# Patient Record
Sex: Female | Born: 1985 | Race: White | Hispanic: No | Marital: Married | Smoking: Former smoker
Health system: Southern US, Community
[De-identification: ages and names within clinical notes are randomized; demographics above are authoritative.]

## PROBLEM LIST (undated history)

## (undated) ENCOUNTER — Inpatient Hospital Stay (HOSPITAL_COMMUNITY): Payer: Self-pay

## (undated) ENCOUNTER — Emergency Department (HOSPITAL_BASED_OUTPATIENT_CLINIC_OR_DEPARTMENT_OTHER): Admission: EM | Payer: 59

## (undated) DIAGNOSIS — Z87442 Personal history of urinary calculi: Secondary | ICD-10-CM

## (undated) DIAGNOSIS — N12 Tubulo-interstitial nephritis, not specified as acute or chronic: Secondary | ICD-10-CM

## (undated) DIAGNOSIS — T4145XA Adverse effect of unspecified anesthetic, initial encounter: Secondary | ICD-10-CM

## (undated) DIAGNOSIS — F419 Anxiety disorder, unspecified: Secondary | ICD-10-CM

## (undated) DIAGNOSIS — N83209 Unspecified ovarian cyst, unspecified side: Secondary | ICD-10-CM

## (undated) DIAGNOSIS — O139 Gestational [pregnancy-induced] hypertension without significant proteinuria, unspecified trimester: Secondary | ICD-10-CM

## (undated) DIAGNOSIS — T8859XA Other complications of anesthesia, initial encounter: Secondary | ICD-10-CM

## (undated) DIAGNOSIS — K56609 Unspecified intestinal obstruction, unspecified as to partial versus complete obstruction: Secondary | ICD-10-CM

## (undated) DIAGNOSIS — M1990 Unspecified osteoarthritis, unspecified site: Secondary | ICD-10-CM

## (undated) DIAGNOSIS — L4050 Arthropathic psoriasis, unspecified: Secondary | ICD-10-CM

## (undated) DIAGNOSIS — F32A Depression, unspecified: Secondary | ICD-10-CM

## (undated) DIAGNOSIS — K567 Ileus, unspecified: Secondary | ICD-10-CM

## (undated) DIAGNOSIS — F329 Major depressive disorder, single episode, unspecified: Secondary | ICD-10-CM

## (undated) HISTORY — PX: WISDOM TOOTH EXTRACTION: SHX21

## (undated) HISTORY — PX: ABDOMINAL HYSTERECTOMY: SHX81

## (undated) SURGERY — Surgical Case
Anesthesia: *Unknown

---
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
## 2006-09-23 ENCOUNTER — Emergency Department (HOSPITAL_COMMUNITY): Admission: EM | Admit: 2006-09-23 | Discharge: 2006-09-24 | Payer: Self-pay | Admitting: Emergency Medicine

## 2007-03-07 ENCOUNTER — Encounter: Admission: RE | Admit: 2007-03-07 | Discharge: 2007-03-07 | Payer: Self-pay | Admitting: Family Medicine

## 2010-02-01 ENCOUNTER — Inpatient Hospital Stay (HOSPITAL_COMMUNITY): Admission: AD | Admit: 2010-02-01 | Discharge: 2010-02-01 | Payer: Self-pay | Admitting: Obstetrics & Gynecology

## 2010-09-06 ENCOUNTER — Inpatient Hospital Stay (HOSPITAL_COMMUNITY): Admission: AD | Admit: 2010-09-06 | Discharge: 2010-09-06 | Payer: Self-pay | Admitting: Obstetrics and Gynecology

## 2010-09-06 ENCOUNTER — Ambulatory Visit: Payer: Self-pay | Admitting: Obstetrics and Gynecology

## 2010-11-10 ENCOUNTER — Ambulatory Visit (HOSPITAL_COMMUNITY): Admission: RE | Admit: 2010-11-10 | Discharge: 2010-11-10 | Payer: Self-pay | Admitting: Obstetrics and Gynecology

## 2010-11-20 ENCOUNTER — Inpatient Hospital Stay (HOSPITAL_COMMUNITY)
Admission: AD | Admit: 2010-11-20 | Discharge: 2010-11-21 | Payer: Self-pay | Source: Home / Self Care | Admitting: Obstetrics and Gynecology

## 2011-01-28 ENCOUNTER — Inpatient Hospital Stay (HOSPITAL_COMMUNITY)
Admission: AD | Admit: 2011-01-28 | Discharge: 2011-01-28 | Disposition: A | Discharge status: Home / Self Care | Payer: BC Managed Care – PPO | Source: Ambulatory Visit | Attending: Obstetrics and Gynecology | Admitting: Obstetrics and Gynecology

## 2011-01-28 DIAGNOSIS — O10019 Pre-existing essential hypertension complicating pregnancy, unspecified trimester: Secondary | ICD-10-CM | POA: Insufficient documentation

## 2011-01-28 LAB — COMPREHENSIVE METABOLIC PANEL
ALT: 15 U/L (ref 0–35)
AST: 20 U/L (ref 0–37)
CO2: 22 mEq/L (ref 19–32)
Calcium: 8.7 mg/dL (ref 8.4–10.5)
GFR calc Af Amer: >60 mL/min (ref >60)
GFR calc non Af Amer: >60 mL/min (ref >60)
Potassium: 3.6 mEq/L (ref 3.5–5.1)
Sodium: 135 mEq/L (ref 135–145)

## 2011-01-28 LAB — URINALYSIS, ROUTINE W REFLEX MICROSCOPIC
Ketones, ur: NEGATIVE mg/dL
Nitrite: NEGATIVE
Specific Gravity, Urine: 1.01 (ref 1.005–1.030)
pH: 6 (ref 5.0–8.0)

## 2011-01-28 LAB — CBC
Hemoglobin: 11.6 g/dL — ABNORMAL LOW (ref 12.0–15.0)
MCHC: 33.7 g/dL (ref 30.0–36.0)
RBC: 3.72 MIL/uL — ABNORMAL LOW (ref 3.87–5.11)
WBC: 15.6 10*3/uL — ABNORMAL HIGH (ref 4.0–10.5)

## 2011-02-06 ENCOUNTER — Inpatient Hospital Stay (HOSPITAL_COMMUNITY)
Admission: AD | Admit: 2011-02-06 | Discharge: 2011-02-06 | Disposition: A | Discharge status: Home / Self Care | Payer: Federal, State, Local not specified - PPO | Source: Ambulatory Visit | Attending: Obstetrics and Gynecology | Admitting: Obstetrics and Gynecology

## 2011-02-06 DIAGNOSIS — O4700 False labor before 37 completed weeks of gestation, unspecified trimester: Secondary | ICD-10-CM | POA: Insufficient documentation

## 2011-02-07 ENCOUNTER — Inpatient Hospital Stay (HOSPITAL_COMMUNITY)
Admission: AD | Admit: 2011-02-07 | Discharge: 2011-02-07 | Disposition: A | Discharge status: Home / Self Care | Payer: Federal, State, Local not specified - PPO | Source: Ambulatory Visit | Attending: Obstetrics and Gynecology | Admitting: Obstetrics and Gynecology

## 2011-02-07 DIAGNOSIS — O4700 False labor before 37 completed weeks of gestation, unspecified trimester: Secondary | ICD-10-CM | POA: Insufficient documentation

## 2011-02-14 ENCOUNTER — Ambulatory Visit (HOSPITAL_COMMUNITY)
Admission: RE | Admit: 2011-02-14 | Discharge: 2011-02-14 | Disposition: A | Discharge status: Home / Self Care | Payer: Federal, State, Local not specified - PPO | Source: Ambulatory Visit | Attending: Obstetrics and Gynecology | Admitting: Obstetrics and Gynecology

## 2011-02-14 ENCOUNTER — Other Ambulatory Visit (HOSPITAL_COMMUNITY): Payer: Self-pay | Admitting: Obstetrics and Gynecology

## 2011-02-14 DIAGNOSIS — O26839 Pregnancy related renal disease, unspecified trimester: Secondary | ICD-10-CM | POA: Insufficient documentation

## 2011-02-14 DIAGNOSIS — O99891 Other specified diseases and conditions complicating pregnancy: Secondary | ICD-10-CM | POA: Insufficient documentation

## 2011-02-14 DIAGNOSIS — R509 Fever, unspecified: Secondary | ICD-10-CM

## 2011-02-14 DIAGNOSIS — N1330 Unspecified hydronephrosis: Secondary | ICD-10-CM | POA: Insufficient documentation

## 2011-02-22 ENCOUNTER — Other Ambulatory Visit (HOSPITAL_COMMUNITY): Payer: Self-pay | Admitting: Obstetrics and Gynecology

## 2011-02-22 ENCOUNTER — Ambulatory Visit (HOSPITAL_COMMUNITY)
Admission: RE | Admit: 2011-02-22 | Discharge: 2011-02-22 | Disposition: A | Discharge status: Home / Self Care | Payer: Federal, State, Local not specified - PPO | Source: Ambulatory Visit | Attending: Obstetrics and Gynecology | Admitting: Obstetrics and Gynecology

## 2011-02-22 DIAGNOSIS — O288 Other abnormal findings on antenatal screening of mother: Secondary | ICD-10-CM

## 2011-02-22 DIAGNOSIS — O368190 Decreased fetal movements, unspecified trimester, not applicable or unspecified: Secondary | ICD-10-CM | POA: Insufficient documentation

## 2011-02-22 DIAGNOSIS — O139 Gestational [pregnancy-induced] hypertension without significant proteinuria, unspecified trimester: Secondary | ICD-10-CM | POA: Insufficient documentation

## 2011-02-27 LAB — COMPREHENSIVE METABOLIC PANEL
BUN: 7 mg/dL (ref 6–23)
CO2: 22 mEq/L (ref 19–32)
Chloride: 102 mEq/L (ref 96–112)
Creatinine, Ser: 0.66 mg/dL (ref 0.4–1.2)
GFR calc non Af Amer: >60 mL/min (ref >60)
Total Bilirubin: 0.9 mg/dL (ref 0.3–1.2)

## 2011-02-27 LAB — CBC
Hemoglobin: 12.4 g/dL (ref 12.0–15.0)
MCH: 31.4 pg (ref 26.0–34.0)
MCV: 95.7 fL (ref 78.0–100.0)
Platelets: 180 10*3/uL (ref 150–400)
RBC: 3.94 MIL/uL (ref 3.87–5.11)

## 2011-02-27 LAB — URINALYSIS, ROUTINE W REFLEX MICROSCOPIC
Nitrite: NEGATIVE
Specific Gravity, Urine: 1.01 (ref 1.005–1.030)
Urobilinogen, UA: 0.2 mg/dL (ref 0.0–1.0)

## 2011-03-01 ENCOUNTER — Ambulatory Visit (HOSPITAL_COMMUNITY): Payer: BC Managed Care – PPO

## 2011-03-01 LAB — BASIC METABOLIC PANEL
BUN: 6 mg/dL (ref 6–23)
CO2: 26 mEq/L (ref 19–32)
Calcium: 9.2 mg/dL (ref 8.4–10.5)
Creatinine, Ser: 0.53 mg/dL (ref 0.4–1.2)
GFR calc Af Amer: >60 mL/min (ref >60)

## 2011-03-01 LAB — URINALYSIS, ROUTINE W REFLEX MICROSCOPIC
Hgb urine dipstick: NEGATIVE
Protein, ur: NEGATIVE mg/dL
Urobilinogen, UA: 0.2 mg/dL (ref 0.0–1.0)

## 2011-03-01 LAB — CBC
MCH: 32.6 pg (ref 26.0–34.0)
Platelets: 183 10*3/uL (ref 150–400)
RBC: 3.86 MIL/uL — ABNORMAL LOW (ref 3.87–5.11)
RDW: 12.6 % (ref 11.5–15.5)

## 2011-03-08 LAB — CBC
MCHC: 32.7 g/dL (ref 30.0–36.0)
Platelets: 199 10*3/uL (ref 150–400)
RBC: 4.2 MIL/uL (ref 3.87–5.11)
RDW: 12.5 % (ref 11.5–15.5)

## 2011-03-08 LAB — HCG, QUANTITATIVE, PREGNANCY: hCG, Beta Chain, Quant, S: 1065 m[IU]/mL — ABNORMAL HIGH (ref <5)

## 2011-03-08 LAB — URINALYSIS, ROUTINE W REFLEX MICROSCOPIC
Glucose, UA: NEGATIVE mg/dL
Nitrite: NEGATIVE
Specific Gravity, Urine: <1.005 — ABNORMAL LOW (ref 1.005–1.030)
pH: 5.5 (ref 5.0–8.0)

## 2011-03-08 LAB — RH IMMUNE GLOBULIN WORKUP (NOT WOMEN'S HOSP)

## 2011-03-08 LAB — WET PREP, GENITAL
Trich, Wet Prep: NONE SEEN
Yeast Wet Prep HPF POC: NONE SEEN

## 2011-03-08 LAB — ABO/RH: ABO/RH(D): O NEG

## 2011-03-08 LAB — GC/CHLAMYDIA PROBE AMP, GENITAL: GC Probe Amp, Genital: NEGATIVE

## 2011-03-08 LAB — POCT PREGNANCY, URINE: Preg Test, Ur: POSITIVE

## 2011-03-10 ENCOUNTER — Inpatient Hospital Stay (HOSPITAL_COMMUNITY)
Admission: AD | Admit: 2011-03-10 | Discharge: 2011-03-13 | DRG: 372 | Disposition: A | Discharge status: Home / Self Care | Payer: Federal, State, Local not specified - PPO | Source: Ambulatory Visit | Attending: Obstetrics and Gynecology | Admitting: Obstetrics and Gynecology

## 2011-03-10 DIAGNOSIS — O139 Gestational [pregnancy-induced] hypertension without significant proteinuria, unspecified trimester: Principal | ICD-10-CM | POA: Diagnosis present

## 2011-03-10 LAB — CBC
MCHC: 33.2 g/dL (ref 30.0–36.0)
Platelets: 205 10*3/uL (ref 150–400)
RDW: 13.2 % (ref 11.5–15.5)
WBC: 17.9 10*3/uL — ABNORMAL HIGH (ref 4.0–10.5)

## 2011-03-10 LAB — URINE MICROSCOPIC-ADD ON

## 2011-03-10 LAB — URINALYSIS, ROUTINE W REFLEX MICROSCOPIC
Bilirubin Urine: NEGATIVE
Glucose, UA: NEGATIVE mg/dL
Hgb urine dipstick: NEGATIVE
Ketones, ur: NEGATIVE mg/dL
Nitrite: NEGATIVE
Protein, ur: NEGATIVE mg/dL
Specific Gravity, Urine: 1.01 (ref 1.005–1.030)
Urobilinogen, UA: 0.2 mg/dL (ref 0.0–1.0)
pH: 6.5 (ref 5.0–8.0)

## 2011-03-10 LAB — COMPREHENSIVE METABOLIC PANEL
ALT: 17 U/L (ref 0–35)
AST: 27 U/L (ref 0–37)
Albumin: 3.1 g/dL — ABNORMAL LOW (ref 3.5–5.2)
Calcium: 8.8 mg/dL (ref 8.4–10.5)
GFR calc Af Amer: >60 mL/min (ref >60)
Sodium: 135 mEq/L (ref 135–145)
Total Protein: 5.7 g/dL — ABNORMAL LOW (ref 6.0–8.3)

## 2011-03-10 LAB — LACTATE DEHYDROGENASE: LDH: 153 U/L (ref 94–250)

## 2011-03-11 LAB — CBC
HCT: 36.2 % (ref 36.0–46.0)
Hemoglobin: 12.5 g/dL (ref 12.0–15.0)
Hemoglobin: 11.7 g/dL — ABNORMAL LOW (ref 12.0–15.0)
MCHC: 32.3 g/dL (ref 30.0–36.0)
MCV: 93.8 fL (ref 78.0–100.0)
Platelets: 202 10*3/uL (ref 150–400)
RBC: 4.03 MIL/uL (ref 3.87–5.11)
WBC: 19 10*3/uL — ABNORMAL HIGH (ref 4.0–10.5)

## 2011-03-12 LAB — CBC
Hemoglobin: 11.2 g/dL — ABNORMAL LOW (ref 12.0–15.0)
MCH: 30.8 pg (ref 26.0–34.0)
MCV: 95.1 fL (ref 78.0–100.0)
Platelets: 179 10*3/uL (ref 150–400)
RBC: 3.64 MIL/uL — ABNORMAL LOW (ref 3.87–5.11)

## 2011-03-13 LAB — DIFFERENTIAL
Eosinophils Absolute: 0.3 10*3/uL (ref 0.0–0.7)
Eosinophils Relative: 2 % (ref 0–5)
Lymphs Abs: 4.2 10*3/uL — ABNORMAL HIGH (ref 0.7–4.0)
Monocytes Relative: 11 % (ref 3–12)

## 2011-03-13 LAB — CBC
MCH: 30.9 pg (ref 26.0–34.0)
MCHC: 32.4 g/dL (ref 30.0–36.0)
MCV: 95.4 fL (ref 78.0–100.0)
Platelets: 195 10*3/uL (ref 150–400)
RDW: 13.5 % (ref 11.5–15.5)

## 2011-07-06 ENCOUNTER — Other Ambulatory Visit: Payer: Self-pay | Admitting: Gastroenterology

## 2011-07-06 DIAGNOSIS — R1011 Right upper quadrant pain: Secondary | ICD-10-CM

## 2011-07-12 ENCOUNTER — Other Ambulatory Visit (HOSPITAL_COMMUNITY): Payer: Self-pay | Admitting: Gastroenterology

## 2011-07-12 ENCOUNTER — Ambulatory Visit
Admission: RE | Admit: 2011-07-12 | Discharge: 2011-07-12 | Disposition: A | Discharge status: Home / Self Care | Payer: Federal, State, Local not specified - PPO | Source: Ambulatory Visit | Attending: Gastroenterology | Admitting: Gastroenterology

## 2011-07-12 DIAGNOSIS — R1011 Right upper quadrant pain: Secondary | ICD-10-CM

## 2011-07-13 ENCOUNTER — Other Ambulatory Visit: Payer: Federal, State, Local not specified - PPO

## 2011-07-20 ENCOUNTER — Other Ambulatory Visit (HOSPITAL_COMMUNITY): Payer: Federal, State, Local not specified - PPO

## 2011-07-23 ENCOUNTER — Other Ambulatory Visit (HOSPITAL_COMMUNITY): Payer: Federal, State, Local not specified - PPO

## 2011-07-27 ENCOUNTER — Other Ambulatory Visit (HOSPITAL_COMMUNITY): Payer: Federal, State, Local not specified - PPO

## 2011-09-28 ENCOUNTER — Other Ambulatory Visit (HOSPITAL_COMMUNITY): Payer: Self-pay | Admitting: Gastroenterology

## 2011-10-11 ENCOUNTER — Other Ambulatory Visit (HOSPITAL_COMMUNITY): Payer: Federal, State, Local not specified - PPO

## 2011-10-19 ENCOUNTER — Encounter (HOSPITAL_COMMUNITY)
Admission: RE | Admit: 2011-10-19 | Discharge: 2011-10-19 | Disposition: A | Discharge status: Home / Self Care | Payer: Federal, State, Local not specified - PPO | Source: Ambulatory Visit | Attending: Gastroenterology | Admitting: Gastroenterology

## 2011-10-19 DIAGNOSIS — R109 Unspecified abdominal pain: Secondary | ICD-10-CM | POA: Insufficient documentation

## 2011-10-19 MED ORDER — TECHNETIUM TC 99M MEBROFENIN IV KIT
5.0000 | PACK | Freq: Once | INTRAVENOUS | Status: AC | PRN
  Administered 2011-10-19: 5 via INTRAVENOUS

## 2011-11-02 ENCOUNTER — Encounter (INDEPENDENT_AMBULATORY_CARE_PROVIDER_SITE_OTHER): Payer: Self-pay | Admitting: General Surgery

## 2011-11-02 ENCOUNTER — Encounter (HOSPITAL_COMMUNITY): Payer: Self-pay | Admitting: *Deleted

## 2011-11-02 ENCOUNTER — Ambulatory Visit (INDEPENDENT_AMBULATORY_CARE_PROVIDER_SITE_OTHER): Payer: 59 | Admitting: General Surgery

## 2011-11-02 VITALS — BP 112/68 | HR 68 | Temp 97.1°F | Resp 18 | Ht 68.0 in | Wt 154.3 lb

## 2011-11-02 DIAGNOSIS — R1011 Right upper quadrant pain: Secondary | ICD-10-CM

## 2011-11-02 NOTE — Patient Instructions (Signed)

## 2011-11-05 ENCOUNTER — Encounter (HOSPITAL_COMMUNITY): Payer: Self-pay | Admitting: Pharmacy Technician

## 2011-11-06 ENCOUNTER — Other Ambulatory Visit (HOSPITAL_COMMUNITY): Payer: 59

## 2011-11-06 ENCOUNTER — Encounter (HOSPITAL_COMMUNITY): Payer: Self-pay | Admitting: *Deleted

## 2011-11-06 ENCOUNTER — Encounter (INDEPENDENT_AMBULATORY_CARE_PROVIDER_SITE_OTHER): Payer: Self-pay | Admitting: General Surgery

## 2011-11-06 NOTE — Progress Notes (Signed)
Patient ID: Brenda Webb, female   DOB: 12/22/1985, 25 y.o.   MRN: [id number]  Chief Complaint  Patient presents with  . New Evaluation    eval of RUQ pain possible GB     HPI Brenda Webb is a 25 y.o. female.   HPI  25yo WF referred by Dr Outlaw for evaluation of right sided abdominal pain. The patients states her symptoms started about 7 weeks after giving birth. She describes it as severe right sided pain. Initially, it occurred post-prandial. She noticed a correlation to spicy & greasy foods. She states she had a CT at [facility] which was negative.  She then saw Dr Outlaw because of ongoing problems. They did a 2 weeks trial of anti-acids which did not ameliorate her symptoms.  An u/s was done which was negative. A HIDA scan was then performed.  She states that her pain now occurs at random, intermittent times. It will start on her right side and radiate to her back. It is a dull ache with some "stabbing" pains. It will last hours.  It is associated with nausea and rarely vomiting. She states she has lost about 5 pounds over the past several months some of which has been intentional.  She has had some loose stools. She denies jaundice, acholic stools, reflux, melena, or hematochezia.   Past Medical History  Diagnosis Date  . Recent childbirth 7 1/2 months ago    Past Surgical History  Procedure Date  . Tonsillectomy 2004    Family History  Problem Relation Age of Onset  . Cancer Paternal Grandfather     bladder    Social History History  Substance Use Topics  . Smoking status: Former Smoker  . Smokeless tobacco: Never Used  . Alcohol Use: No    Allergies  Allergen Reactions  . Shrimp (Shellfish Allergy) Swelling    Current Outpatient Prescriptions  Medication Sig Dispense Refill  . PRENATAL VITAMINS PO Take by mouth at bedtime.          Review of Systems Review of Systems  Constitutional: Negative for fever, activity change, appetite change and unexpected weight  change.  HENT: Negative for nosebleeds and congestion.   Eyes: Negative for photophobia and visual disturbance.  Respiratory: Negative for apnea, chest tightness, shortness of breath and wheezing.   Cardiovascular: Negative for chest pain and palpitations.  Gastrointestinal:       See hpi  Genitourinary: Negative for dysuria, urgency, frequency, difficulty urinating and pelvic pain.       Mirena IUD  Musculoskeletal: Negative.   Neurological: Negative.   Hematological: Negative.   Psychiatric/Behavioral: Negative.     Blood pressure 112/68, pulse 68, temperature 97.1 F (36.2 C), temperature source Temporal, resp. rate 18, height 5' 8" (1.727 m), weight 154 lb 5 oz (69.996 kg), last menstrual period 11/01/2011.  Physical Exam Physical Exam  Vitals reviewed. Constitutional: She is oriented to person, place, and time. She appears well-developed and well-nourished. No distress.  HENT:  Head: Normocephalic and atraumatic.  Eyes: Conjunctivae are normal. No scleral icterus.  Neck: Normal range of motion. Neck supple. No tracheal deviation present. No thyromegaly present.  Cardiovascular: Normal rate, regular rhythm and normal heart sounds.   Pulmonary/Chest: Effort normal and breath sounds normal. No respiratory distress. She has no wheezes.  Abdominal: Soft. Bowel sounds are normal. She exhibits no distension. There is no tenderness. There is no rebound.  Musculoskeletal: Normal range of motion. She exhibits no edema and no tenderness.  Lymphadenopathy:      She has no cervical adenopathy.  Neurological: She is alert and oriented to person, place, and time. She exhibits normal muscle tone.  Skin: Skin is warm and dry. No rash noted. No erythema.  Psychiatric: She has a normal mood and affect. Her behavior is normal. Thought content normal.    Data Reviewed Dr Outlaw's notes from 7/20, 10/12 including CMET, CBC, lipase done 07/11/11  Abd u/s 07/12/11 - nml  HIDA 10/19/11 - nml except  pain with CCK infusion    Assessment    Right upper quadrant abdominal pain, probable chronic cholecystitis     Plan    We discussed gallbladder disease. We discussed different causes of right upper quadrant pain.  We discussed her imaging to date. The patient was given educational material. We discussed non-operative and operative management.   I discussed laparoscopic cholecystectomy in detail.  The patient was given educational material as well as diagrams detailing the procedure.  We discussed the risks and benefits of a laparoscopic cholecystectomy including, but not limited to bleeding, infection, injury to surrounding structures such as the intestine or liver, bile leak, retained gallstones, need to convert to an open procedure, prolonged diarrhea, blood clots such as  DVT, common bile duct injury, anesthesia risks, and possible need for additional procedures.  We discussed the typical post-operative recovery course. I explained that the likelihood of improvement of their symptoms is 50-70%.  She has decided to proceed with surgery.  Baillie Mohammad M. Defne Gerling, MD, FACS General, Bariatric, & Minimally Invasive Surgery [facility] [place]         Danajah Birdsell M 11/06/2011, 10:25 AM    

## 2011-11-06 NOTE — Pre-Procedure Instructions (Signed)
Cbc with dif, cmet, ua 10-31-11 on chart Pt instructed hibilclens shower hs and am of surgery, use from neck down avoid private are no shaving 2 days before surgery

## 2011-11-12 ENCOUNTER — Encounter (HOSPITAL_COMMUNITY)
Admission: RE | Disposition: A | Discharge status: Home / Self Care | Payer: Self-pay | Source: Ambulatory Visit | Attending: General Surgery

## 2011-11-12 ENCOUNTER — Ambulatory Visit (HOSPITAL_COMMUNITY)
Admission: RE | Admit: 2011-11-12 | Discharge: 2011-11-12 | Disposition: A | Discharge status: Home / Self Care | Payer: 59 | Source: Ambulatory Visit | Attending: General Surgery | Admitting: General Surgery

## 2011-11-12 ENCOUNTER — Encounter (HOSPITAL_COMMUNITY): Payer: Self-pay | Admitting: Anesthesiology

## 2011-11-12 ENCOUNTER — Encounter (HOSPITAL_COMMUNITY): Payer: Self-pay | Admitting: *Deleted

## 2011-11-12 ENCOUNTER — Ambulatory Visit (HOSPITAL_COMMUNITY): Payer: 59 | Admitting: Anesthesiology

## 2011-11-12 ENCOUNTER — Other Ambulatory Visit (INDEPENDENT_AMBULATORY_CARE_PROVIDER_SITE_OTHER): Payer: Self-pay | Admitting: General Surgery

## 2011-11-12 DIAGNOSIS — R1011 Right upper quadrant pain: Secondary | ICD-10-CM | POA: Insufficient documentation

## 2011-11-12 DIAGNOSIS — K811 Chronic cholecystitis: Secondary | ICD-10-CM

## 2011-11-12 HISTORY — PX: CHOLECYSTECTOMY: SHX55

## 2011-11-12 LAB — HCG, SERUM, QUALITATIVE: Preg, Serum: NEGATIVE

## 2011-11-12 SURGERY — LAPAROSCOPIC CHOLECYSTECTOMY
Anesthesia: General | Site: Abdomen | Wound class: Clean Contaminated

## 2011-11-12 MED ORDER — LACTATED RINGERS IV SOLN
INTRAVENOUS | Status: DC | PRN
  Administered 2011-11-12: 1000 mL via INTRAVENOUS

## 2011-11-12 MED ORDER — SODIUM CHLORIDE 0.9 % IR SOLN
Status: DC | PRN
  Administered 2011-11-12: 1000 mL

## 2011-11-12 MED ORDER — GLYCOPYRROLATE 0.2 MG/ML IJ SOLN
INTRAMUSCULAR | Status: DC | PRN
  Administered 2011-11-12: .4 mg via INTRAVENOUS

## 2011-11-12 MED ORDER — OXYCODONE-ACETAMINOPHEN 5-325 MG PO TABS: 1.0000 | ORAL_TABLET | ORAL | Status: DC | PRN

## 2011-11-12 MED ORDER — BUPIVACAINE-EPINEPHRINE 0.25% -1:200000 IJ SOLN
INTRAMUSCULAR | Status: DC | PRN
  Administered 2011-11-12: 30 mL

## 2011-11-12 MED ORDER — ONDANSETRON HCL 4 MG/2ML IJ SOLN: 4.0000 mg | Freq: Once | INTRAMUSCULAR | Status: DC | PRN

## 2011-11-12 MED ORDER — PROMETHAZINE HCL 25 MG/ML IJ SOLN
INTRAMUSCULAR | Status: AC
  Administered 2011-11-12: 6.25 mg
  Filled 2011-11-12: qty 1

## 2011-11-12 MED ORDER — NEOSTIGMINE METHYLSULFATE 1 MG/ML IJ SOLN
INTRAMUSCULAR | Status: DC | PRN
  Administered 2011-11-12: 3 mg via INTRAVENOUS

## 2011-11-12 MED ORDER — OXYCODONE-ACETAMINOPHEN 5-325 MG PO TABS: 1.0000 | ORAL_TABLET | ORAL | Status: AC | PRN

## 2011-11-12 MED ORDER — LACTATED RINGERS IV SOLN
INTRAVENOUS | Status: DC | PRN
  Administered 2011-11-12 (×2): via INTRAVENOUS

## 2011-11-12 MED ORDER — LIDOCAINE HCL (CARDIAC) 20 MG/ML IV SOLN
INTRAVENOUS | Status: DC | PRN
  Administered 2011-11-12: 60 mg via INTRAVENOUS

## 2011-11-12 MED ORDER — IOHEXOL 300 MG/ML  SOLN
INTRAMUSCULAR | Status: AC
  Filled 2011-11-12: qty 1

## 2011-11-12 MED ORDER — ONDANSETRON HCL 4 MG/2ML IJ SOLN
INTRAMUSCULAR | Status: DC | PRN
  Administered 2011-11-12: 4 mg via INTRAVENOUS

## 2011-11-12 MED ORDER — FENTANYL CITRATE 0.05 MG/ML IJ SOLN
INTRAMUSCULAR | Status: DC | PRN
  Administered 2011-11-12 (×2): 50 ug via INTRAVENOUS
  Administered 2011-11-12: 100 ug via INTRAVENOUS
  Administered 2011-11-12: 50 ug via INTRAVENOUS

## 2011-11-12 MED ORDER — CEFOXITIN SODIUM-DEXTROSE 1-4 GM-% IV SOLR (PREMIX): 1.0000 g | INTRAVENOUS | Status: DC

## 2011-11-12 MED ORDER — MEPERIDINE HCL 50 MG/ML IJ SOLN: 6.2500 mg | INTRAMUSCULAR | Status: DC | PRN

## 2011-11-12 MED ORDER — CEFOXITIN SODIUM-DEXTROSE 1-4 GM-% IV SOLR (PREMIX)
INTRAVENOUS | Status: AC
  Filled 2011-11-12: qty 50

## 2011-11-12 MED ORDER — BUPIVACAINE-EPINEPHRINE PF 0.25-1:200000 % IJ SOLN
INTRAMUSCULAR | Status: AC
  Filled 2011-11-12: qty 30

## 2011-11-12 MED ORDER — MIDAZOLAM HCL 5 MG/5ML IJ SOLN
INTRAMUSCULAR | Status: DC | PRN
  Administered 2011-11-12: 2 mg via INTRAVENOUS

## 2011-11-12 MED ORDER — ONDANSETRON HCL 4 MG/2ML IJ SOLN: 4.0000 mg | INTRAMUSCULAR | Status: DC | PRN

## 2011-11-12 MED ORDER — MORPHINE SULFATE 2 MG/ML IJ SOLN: 1.0000 mg | INTRAMUSCULAR | Status: DC | PRN

## 2011-11-12 MED ORDER — LACTATED RINGERS IV SOLN: INTRAVENOUS | Status: DC

## 2011-11-12 MED ORDER — ROCURONIUM BROMIDE 100 MG/10ML IV SOLN
INTRAVENOUS | Status: DC | PRN
  Administered 2011-11-12: 40 mg via INTRAVENOUS

## 2011-11-12 MED ORDER — LACTATED RINGERS IV SOLN
INTRAVENOUS | Status: DC
  Administered 2011-11-12: 1000 mL via INTRAVENOUS

## 2011-11-12 MED ORDER — FENTANYL CITRATE 0.05 MG/ML IJ SOLN
INTRAMUSCULAR | Status: AC
  Filled 2011-11-12: qty 2

## 2011-11-12 MED ORDER — FENTANYL CITRATE 0.05 MG/ML IJ SOLN
25.0000 ug | INTRAMUSCULAR | Status: DC | PRN
  Administered 2011-11-12 (×2): 50 ug via INTRAVENOUS

## 2011-11-12 MED ORDER — PROPOFOL 10 MG/ML IV EMUL
INTRAVENOUS | Status: DC | PRN
  Administered 2011-11-12: 150 mg via INTRAVENOUS

## 2011-11-12 SURGICAL SUPPLY — 41 items
APPLIER CLIP 5 13 M/L LIGAMAX5 (MISCELLANEOUS) ×2
APPLIER CLIP ROT 10 11.4 M/L (STAPLE)
BANDAGE ADHESIVE 1X3 (GAUZE/BANDAGES/DRESSINGS) ×6 IMPLANT
BENZOIN TINCTURE PRP APPL 2/3 (GAUZE/BANDAGES/DRESSINGS) ×2 IMPLANT
CANISTER SUCTION 2500CC (MISCELLANEOUS) ×2 IMPLANT
CHLORAPREP W/TINT 26ML (MISCELLANEOUS) ×2 IMPLANT
CLIP APPLIE 5 13 M/L LIGAMAX5 (MISCELLANEOUS) ×1 IMPLANT
CLIP APPLIE ROT 10 11.4 M/L (STAPLE) IMPLANT
CLOTH BEACON ORANGE TIMEOUT ST (SAFETY) ×2 IMPLANT
COVER MAYO STAND STRL (DRAPES) IMPLANT
COVER SURGICAL LIGHT HANDLE (MISCELLANEOUS) ×2 IMPLANT
DECANTER SPIKE VIAL GLASS SM (MISCELLANEOUS) ×2 IMPLANT
DERMABOND ADVANCED (GAUZE/BANDAGES/DRESSINGS) ×1
DERMABOND ADVANCED .7 DNX12 (GAUZE/BANDAGES/DRESSINGS) ×1 IMPLANT
DRAPE C-ARM 42X72 X-RAY (DRAPES) IMPLANT
DRAPE LAPAROSCOPIC ABDOMINAL (DRAPES) ×2 IMPLANT
DRAPE UTILITY XL STRL (DRAPES) ×2 IMPLANT
DRSG TEGADERM 2-3/8X2-3/4 SM (GAUZE/BANDAGES/DRESSINGS) ×2 IMPLANT
ELECT REM PT RETURN 9FT ADLT (ELECTROSURGICAL) ×2
ELECTRODE REM PT RTRN 9FT ADLT (ELECTROSURGICAL) ×1 IMPLANT
GLOVE BIO SURGEON STRL SZ7.5 (GLOVE) ×2 IMPLANT
GLOVE BIOGEL PI IND STRL 7.0 (GLOVE) ×1 IMPLANT
GLOVE BIOGEL PI INDICATOR 7.0 (GLOVE) ×1
GLOVE INDICATOR 8.0 STRL GRN (GLOVE) ×2 IMPLANT
GOWN STRL NON-REIN LRG LVL3 (GOWN DISPOSABLE) ×2 IMPLANT
GOWN STRL REIN XL XLG (GOWN DISPOSABLE) ×4 IMPLANT
KIT BASIN OR (CUSTOM PROCEDURE TRAY) ×2 IMPLANT
NS IRRIG 1000ML POUR BTL (IV SOLUTION) ×2 IMPLANT
POUCH SPECIMEN RETRIEVAL 10MM (ENDOMECHANICALS) ×2 IMPLANT
SET CHOLANGIOGRAPH MIX (MISCELLANEOUS) IMPLANT
SET IRRIG TUBING LAPAROSCOPIC (IRRIGATION / IRRIGATOR) ×2 IMPLANT
SOLUTION ANTI FOG 6CC (MISCELLANEOUS) ×2 IMPLANT
STRIP CLOSURE SKIN 1/2X4 (GAUZE/BANDAGES/DRESSINGS) ×2 IMPLANT
SUT MNCRL AB 4-0 PS2 18 (SUTURE) ×2 IMPLANT
SUT VICRYL 0 UR6 27IN ABS (SUTURE) ×2 IMPLANT
TOWEL OR 17X26 10 PK STRL BLUE (TOWEL DISPOSABLE) ×2 IMPLANT
TRAY LAP CHOLE (CUSTOM PROCEDURE TRAY) ×2 IMPLANT
TROCAR BLADELESS OPT 5 75 (ENDOMECHANICALS) ×4 IMPLANT
TROCAR XCEL BLUNT TIP 100MML (ENDOMECHANICALS) ×2 IMPLANT
TROCAR XCEL NON-BLD 11X100MML (ENDOMECHANICALS) IMPLANT
TUBING INSUFFLATION 10FT LAP (TUBING) ×2 IMPLANT

## 2011-11-12 NOTE — Anesthesia Postprocedure Evaluation (Signed)
  Anesthesia Post-op Note  Patient: Brenda Webb  Procedure(s) Performed:  LAPAROSCOPIC CHOLECYSTECTOMY  Patient Location: PACU  Anesthesia Type: General  Level of Consciousness: awake and alert   Airway and Oxygen Therapy: Patient Spontanous Breathing  Post-op Pain: mild  Post-op Assessment: Post-op Vital signs reviewed, Patient's Cardiovascular Status Stable, Respiratory Function Stable, Patent Airway and No signs of Nausea or vomiting  Post-op Vital Signs: stable  Complications: No apparent anesthesia complications

## 2011-11-12 NOTE — Op Note (Signed)
Laparoscopic Cholecystectomy without IOC Procedure Note  Indications: This patient presents with symptomatic right upper quadrant pain and will undergo laparoscopic cholecystectomy. She initially had intermittent post-prandial right upper quadrant pain radiating to her back lasting for hours.  The pain has now become more frequent.  A CT, abdominal u/s, and HIDA were negative. She did experience the same symptoms with CCK infusion. A trial of reflux medications did not ameliorate her pain.  We discussed the risks and benefits of cholecystectomy as well as the possibility of continued pain after surgery.  The patient elected to proceed to the operating room  Pre-operative Diagnosis: Abdominal pain, right upper quadrant  Post-operative Diagnosis: Same  Surgeon: Atilano Ina   Assistants: Luretha Murphy, MD  Anesthesia: General endotracheal anesthesia + 0.25%marcaine with epi (30cc)   Procedure Details  The patient was seen again in the Holding Room. The risks, benefits, complications, treatment options, and expected outcomes were discussed with the patient. The possibilities of reaction to medication, pulmonary aspiration, perforation of viscus, bleeding, recurrent infection, finding a normal gallbladder, the need for additional procedures, failure to diagnose a condition, the possible need to convert to an open procedure, and creating a complication requiring transfusion or operation were discussed with the patient. The likelihood of improving the patient's symptoms with return to their baseline status is 50-70%.  The patient and/or family concurred with the proposed plan, giving informed consent. The site of surgery properly noted. The patient was taken to Operating Room, identified as Brenda Webb and the procedure verified as Laparoscopic Cholecystectomy without Intraoperative Cholangiogram. A Time Out was held and the above information confirmed.  Prior to the induction of general anesthesia,  antibiotic prophylaxis was administered. General endotracheal anesthesia was then administered and tolerated well. After the induction, the abdomen was prepped with Chloraprep and draped in the sterile fashion. The patient was positioned in the supine position.  Local anesthetic agent was injected into the skin near the umbilicus and an incision made. We dissected down to the abdominal fascia with blunt dissection.  The fascia was incised vertically and we entered the peritoneal cavity bluntly.  A pursestring suture of 0-Vicryl was placed around the fascial opening.  The Hasson cannula was inserted and secured with the stay suture.  Pneumoperitoneum was then created with CO2 and tolerated well without any adverse changes in the patient's vital signs. An 5-mm port was placed in the subxiphoid position.  Two 5-mm ports were placed in the right upper quadrant. All skin incisions were infiltrated with a local anesthetic agent before making the incision and placing the trocars.   We positioned the patient in reverse Trendelenburg, tilted slightly to the patient's left.  The gallbladder was identified, the fundus grasped and retracted cephalad. Adhesions were lysed bluntly and with the electrocautery where indicated, taking care not to injure any adjacent organs or viscus. The infundibulum was grasped and retracted laterally, exposing the peritoneum overlying the triangle of Calot. This was then divided and exposed in a blunt fashion. A critical view of the cystic duct and cystic artery was obtained.  The cystic duct was clearly identified and bluntly dissected circumferentially. The cystic duct was ligated with a clip distally.   Three clips were placed along the biliary side of the cystic duct. The cystic duct was then divided. The cystic artery was identified, dissected free, ligated with clips and divided as well.   The gallbladder was dissected from the liver bed in retrograde fashion with the electrocautery.  The gallbladder was  removed and placed in an Endocatch sac. The liver bed was irrigated and inspected. Hemostasis was achieved with the electrocautery. Copious irrigation was utilized and was repeatedly aspirated until clear.  The gallbladder and Endocatch sac were then removed through the umbilical port site.  The pursestring suture was used to close the umbilical fascia.    We again inspected the right upper quadrant for hemostasis.  I inspected the abdomen. There were no adhesions to the anterior abdominal wall.  The appendix was visualized and was normal. Pneumoperitoneum was released as we removed the trocars.  4-0 Monocryl was used to close the skin.   Dermabond was applied. The patient was then extubated and brought to the recovery room in stable condition. Instrument, sponge, and needle counts were correct at closure and at the conclusion of the case.   Findings: +critical view; normal appearing appendix.   Estimated Blood Loss: Minimal         Drains: none         Specimens: Gallbladder           Complications: None; patient tolerated the procedure well.         Disposition: PACU - hemodynamically stable.         Condition: stable  Mary Sella. Andrey Campanile, MD, FACS General, Bariatric, & Minimally Invasive Surgery [facility], [place]

## 2011-11-12 NOTE — Preoperative (Signed)
Beta Blockers   Reason not to administer Beta Blockers:Not Applicable 

## 2011-11-12 NOTE — Anesthesia Preprocedure Evaluation (Addendum)
Anesthesia Evaluation  Patient identified by MRN, date of birth, ID band Patient awake    Reviewed: Allergy & Precautions, H&P , NPO status , Patient's Chart, lab work & pertinent test results, reviewed documented beta blocker date and time   Airway Mallampati: II TM Distance: >3 FB Neck ROM: full    Dental No notable dental hx.    Pulmonary neg pulmonary ROS,  clear to auscultation  Pulmonary exam normal       Cardiovascular Exercise Tolerance: Good neg cardio ROS regular Normal    Neuro/Psych Negative Neurological ROS  Negative Psych ROS   GI/Hepatic negative GI ROS, Neg liver ROS,   Endo/Other  Negative Endocrine ROS  Renal/GU negative Renal ROS  Genitourinary negative   Musculoskeletal   Abdominal   Peds  Hematology negative hematology ROS (+)   Anesthesia Other Findings   Reproductive/Obstetrics negative OB ROS                           Anesthesia Physical Anesthesia Plan  ASA: I  Anesthesia Plan: General   Post-op Pain Management:    Induction:   Airway Management Planned:   Additional Equipment:   Intra-op Plan:   Post-operative Plan:   Informed Consent: I have reviewed the patients History and Physical, chart, labs and discussed the procedure including the risks, benefits and alternatives for the proposed anesthesia with the patient or authorized representative who has indicated his/her understanding and acceptance.   Dental Advisory Given  Plan Discussed with: CRNA  Anesthesia Plan Comments:         Anesthesia Quick Evaluation  

## 2011-11-12 NOTE — Interval H&P Note (Signed)
History and Physical Interval Note:   11/12/2011   11:15 AM   Brenda Webb  has presented today for surgery, with the diagnosis of Right Upper Quadrant Abdominal Pain  The various methods of treatment have been discussed with the patient and family. After consideration of risks, benefits and other options for treatment, the patient has consented to  Procedure(s): LAPAROSCOPIC CHOLECYSTECTOMY as a surgical intervention .  The patients' history has been reviewed, patient examined, no change in status, stable for surgery.  I have reviewed the patients' chart and labs.  Questions were answered to the patient's satisfaction.    Mary Sella. Andrey Campanile, MD, FACS General, Bariatric, & Minimally Invasive Surgery [facility], [place]   Atilano Ina  MD

## 2011-11-12 NOTE — H&P (View-Only) (Signed)
Patient ID: Brenda Webb, female   DOB: 07-04-1986, 25 y.o.   MRN: [id number]  Chief Complaint  Patient presents with  . New Evaluation    eval of RUQ pain possible GB     HPI Brenda Webb is a 25 y.o. female.   HPI  25yo WF referred by Dr Dulce Sellar for evaluation of right sided abdominal pain. The patients states her symptoms started about 7 weeks after giving birth. She describes it as severe right sided pain. Initially, it occurred post-prandial. She noticed a correlation to spicy & greasy foods. She states she had a CT at [facility] which was negative.  She then saw Dr Dulce Sellar because of ongoing problems. They did a 2 weeks trial of anti-acids which did not ameliorate her symptoms.  An u/s was done which was negative. A HIDA scan was then performed.  She states that her pain now occurs at random, intermittent times. It will start on her right side and radiate to her back. It is a dull ache with some "stabbing" pains. It will last hours.  It is associated with nausea and rarely vomiting. She states she has lost about 5 pounds over the past several months some of which has been intentional.  She has had some loose stools. She denies jaundice, acholic stools, reflux, melena, or hematochezia.   Past Medical History  Diagnosis Date  . Recent childbirth 7 1/2 months ago    Past Surgical History  Procedure Date  . Tonsillectomy 2004    Family History  Problem Relation Age of Onset  . Cancer Paternal Grandfather     bladder    Social History History  Substance Use Topics  . Smoking status: Former Games developer  . Smokeless tobacco: Never Used  . Alcohol Use: No    Allergies  Allergen Reactions  . Shrimp (Shellfish Allergy) Swelling    Current Outpatient Prescriptions  Medication Sig Dispense Refill  . PRENATAL VITAMINS PO Take by mouth at bedtime.          Review of Systems Review of Systems  Constitutional: Negative for fever, activity change, appetite change and unexpected weight  change.  HENT: Negative for nosebleeds and congestion.   Eyes: Negative for photophobia and visual disturbance.  Respiratory: Negative for apnea, chest tightness, shortness of breath and wheezing.   Cardiovascular: Negative for chest pain and palpitations.  Gastrointestinal:       See hpi  Genitourinary: Negative for dysuria, urgency, frequency, difficulty urinating and pelvic pain.       Mirena IUD  Musculoskeletal: Negative.   Neurological: Negative.   Hematological: Negative.   Psychiatric/Behavioral: Negative.     Blood pressure 112/68, pulse 68, temperature 97.1 F (36.2 C), temperature source Temporal, resp. rate 18, height 5\' 8"  (1.727 m), weight 154 lb 5 oz (69.996 kg), last menstrual period 11/01/2011.  Physical Exam Physical Exam  Vitals reviewed. Constitutional: She is oriented to person, place, and time. She appears well-developed and well-nourished. No distress.  HENT:  Head: Normocephalic and atraumatic.  Eyes: Conjunctivae are normal. No scleral icterus.  Neck: Normal range of motion. Neck supple. No tracheal deviation present. No thyromegaly present.  Cardiovascular: Normal rate, regular rhythm and normal heart sounds.   Pulmonary/Chest: Effort normal and breath sounds normal. No respiratory distress. She has no wheezes.  Abdominal: Soft. Bowel sounds are normal. She exhibits no distension. There is no tenderness. There is no rebound.  Musculoskeletal: Normal range of motion. She exhibits no edema and no tenderness.  Lymphadenopathy:  She has no cervical adenopathy.  Neurological: She is alert and oriented to person, place, and time. She exhibits normal muscle tone.  Skin: Skin is warm and dry. No rash noted. No erythema.  Psychiatric: She has a normal mood and affect. Her behavior is normal. Thought content normal.    Data Reviewed Dr Hulen Shouts notes from 7/20, 10/12 including CMET, CBC, lipase done 07/11/11  Abd u/s 07/12/11 - nml  HIDA 10/19/11 - nml except  pain with CCK infusion    Assessment    Right upper quadrant abdominal pain, probable chronic cholecystitis     Plan    We discussed gallbladder disease. We discussed different causes of right upper quadrant pain.  We discussed her imaging to date. The patient was given Agricultural engineer. We discussed non-operative and operative management.   I discussed laparoscopic cholecystectomy in detail.  The patient was given educational material as well as diagrams detailing the procedure.  We discussed the risks and benefits of a laparoscopic cholecystectomy including, but not limited to bleeding, infection, injury to surrounding structures such as the intestine or liver, bile leak, retained gallstones, need to convert to an open procedure, prolonged diarrhea, blood clots such as  DVT, common bile duct injury, anesthesia risks, and possible need for additional procedures.  We discussed the typical post-operative recovery course. I explained that the likelihood of improvement of their symptoms is 50-70%.  She has decided to proceed with surgery.  Mary Sella. Andrey Campanile, MD, FACS General, Bariatric, & Minimally Invasive Surgery [facility], [place]         [facility] M 11/06/2011, 10:25 AM

## 2011-11-12 NOTE — Transfer of Care (Signed)
Immediate Anesthesia Transfer of Care Note  Patient: Brenda Webb  Procedure(s) Performed:  LAPAROSCOPIC CHOLECYSTECTOMY  Patient Location: PACU  Anesthesia Type: General  Level of Consciousness: awake, oriented, patient cooperative and responds to stimulation  Airway & Oxygen Therapy: Patient Spontanous Breathing and Patient connected to face mask oxygen  Post-op Assessment: Report given to PACU RN, Post -op Vital signs reviewed and stable and Patient moving all extremities  Post vital signs: Reviewed and stable  Complications: No apparent anesthesia complications

## 2011-11-14 ENCOUNTER — Encounter (HOSPITAL_COMMUNITY): Payer: Self-pay | Admitting: General Surgery

## 2011-11-15 ENCOUNTER — Telehealth (INDEPENDENT_AMBULATORY_CARE_PROVIDER_SITE_OTHER): Payer: Self-pay

## 2011-11-15 NOTE — Telephone Encounter (Signed)
The patient called and states since this am she has been having difficulty voiding.  She is only voiding a little bit.  She has no burning or bleeding.  She just feels really full and is unable to void.  She has no fever and a little nausea.  She is not eating much.  I spoke to Dr Andrey Campanile and he recommends Flomax 0.4mg  po qd x 5 days, take the 1st dose today.  If no better she should go to the er because they may need to insert a catheter.  I called in the medicine to [facility] [phone].  The pt mentioned she took it before and it caused a migraine but she is willing to try it again.

## 2011-11-19 ENCOUNTER — Telehealth (INDEPENDENT_AMBULATORY_CARE_PROVIDER_SITE_OTHER): Payer: Self-pay

## 2011-11-19 NOTE — Telephone Encounter (Signed)
Patient called and said she is having a little pain in evenings and would like a little more pain medicine. I offered her the protocol hydrocodone but she said she wasn't sure if hydrocodone was ok for her take that since she is breast feeding. She is ok with the hydrocodone if  Dr Andrey Campanile says its ok.

## 2011-11-19 NOTE — Telephone Encounter (Signed)
Spoke with Lesly Rubenstein and she said ok to call in hydrocodone protocol. Called into [facility] [phone].

## 2011-11-30 ENCOUNTER — Encounter (INDEPENDENT_AMBULATORY_CARE_PROVIDER_SITE_OTHER): Payer: Self-pay | Admitting: General Surgery

## 2011-11-30 ENCOUNTER — Ambulatory Visit (INDEPENDENT_AMBULATORY_CARE_PROVIDER_SITE_OTHER): Payer: 59 | Admitting: General Surgery

## 2011-11-30 VITALS — BP 116/70 | HR 68 | Temp 97.4°F | Resp 16 | Ht 68.0 in | Wt 150.0 lb

## 2011-11-30 DIAGNOSIS — Z09 Encounter for follow-up examination after completed treatment for conditions other than malignant neoplasm: Secondary | ICD-10-CM

## 2011-11-30 NOTE — Progress Notes (Signed)
Chief complaint: Postop  Procedure: Status post laparoscopic cholecystectomy November 12, 2011  History of Present Ilness: [name] comes in for her postoperative appointment. She states that she's been doing great. She states that she is very happy that she have the surgery done. She states that she no longer has the pain that she had preoperatively. She denies any fevers, chills, nausea, vomiting, constipation. She went back to work 2 days after surgery. She had one episode of loose stool after eating some steak 3 days after surgery. She had some incisional soreness but that has resolved.  Physical Exam: BP 116/70  Pulse 68  Temp(Src) 97.4 F (36.3 C) (Temporal)  Resp 16  Ht 5\' 8"  (1.727 m)  Wt 150 lb (68.04 kg)  BMI 22.81 kg/m2  LMP 11/01/2011  Gen: alert, NAD, non-toxic appearing Pupils: equal, no scleral icterus Pulm: Lungs clear to auscultation, symmetric chest rise CV: regular rate and rhythm Abd: soft, nontender, nondistended. Well-healed trocar sites. No cellulitis. No incisional hernia Ext: no edema, no calf tenderness Skin: no rash, no jaundice  Pathology: Pathology showed chronic cholecystitis  Assessment and Plan: Status post laparoscopic cholecystectomy for chronic cholecystitis.  She appears to be doing quite well. We discussed her pathology report. Have release her to full activity. I will see her on as needed basis  Mary Sella. Andrey Campanile, MD, FACS General, Bariatric, & Minimally Invasive Surgery [facility], [place]

## 2011-11-30 NOTE — Patient Instructions (Signed)
Can resume full activities.  Your pathology showed chronic cholecystitis.

## 2012-02-22 ENCOUNTER — Emergency Department (INDEPENDENT_AMBULATORY_CARE_PROVIDER_SITE_OTHER): Payer: BC Managed Care – PPO

## 2012-02-22 ENCOUNTER — Encounter (HOSPITAL_BASED_OUTPATIENT_CLINIC_OR_DEPARTMENT_OTHER): Payer: Self-pay | Admitting: *Deleted

## 2012-02-22 ENCOUNTER — Emergency Department (HOSPITAL_BASED_OUTPATIENT_CLINIC_OR_DEPARTMENT_OTHER)
Admission: EM | Admit: 2012-02-22 | Discharge: 2012-02-23 | Disposition: A | Discharge status: Home / Self Care | Payer: BC Managed Care – PPO | Attending: Emergency Medicine | Admitting: Emergency Medicine

## 2012-02-22 ENCOUNTER — Other Ambulatory Visit: Payer: Self-pay

## 2012-02-22 DIAGNOSIS — R079 Chest pain, unspecified: Secondary | ICD-10-CM | POA: Insufficient documentation

## 2012-02-22 DIAGNOSIS — R109 Unspecified abdominal pain: Secondary | ICD-10-CM | POA: Insufficient documentation

## 2012-02-22 LAB — CBC
HCT: 37 % (ref 36.0–46.0)
Hemoglobin: 12.7 g/dL (ref 12.0–15.0)
MCH: 30.9 pg (ref 26.0–34.0)
MCV: 90 fL (ref 78.0–100.0)
RBC: 4.11 MIL/uL (ref 3.87–5.11)

## 2012-02-22 LAB — COMPREHENSIVE METABOLIC PANEL
Alkaline Phosphatase: 60 U/L (ref 39–117)
BUN: 15 mg/dL (ref 6–23)
Calcium: 8.8 mg/dL (ref 8.4–10.5)
GFR calc Af Amer: >90 mL/min (ref >90)
Glucose, Bld: 91 mg/dL (ref 70–99)
Potassium: 3.4 mEq/L — ABNORMAL LOW (ref 3.5–5.1)
Total Protein: 6.5 g/dL (ref 6.0–8.3)

## 2012-02-22 LAB — DIFFERENTIAL
Eosinophils Absolute: 0.1 10*3/uL (ref 0.0–0.7)
Eosinophils Relative: 1 % (ref 0–5)
Lymphs Abs: 1.3 10*3/uL (ref 0.7–4.0)
Monocytes Relative: 5 % (ref 3–12)

## 2012-02-22 LAB — LIPASE, BLOOD: Lipase: 13 U/L (ref 11–59)

## 2012-02-22 MED ORDER — ONDANSETRON HCL 4 MG/2ML IJ SOLN
4.0000 mg | Freq: Once | INTRAMUSCULAR | Status: AC
  Administered 2012-02-22: 4 mg via INTRAVENOUS
  Filled 2012-02-22: qty 2

## 2012-02-22 MED ORDER — MORPHINE SULFATE 4 MG/ML IJ SOLN
4.0000 mg | Freq: Once | INTRAMUSCULAR | Status: AC
  Administered 2012-02-22: 4 mg via INTRAVENOUS
  Filled 2012-02-22: qty 1

## 2012-02-22 MED ORDER — HYDROMORPHONE HCL PF 1 MG/ML IJ SOLN
1.0000 mg | Freq: Once | INTRAMUSCULAR | Status: AC
  Administered 2012-02-22: 1 mg via INTRAVENOUS
  Filled 2012-02-22: qty 1

## 2012-02-22 MED ORDER — SODIUM CHLORIDE 0.9 % IV SOLN
Freq: Once | INTRAVENOUS | Status: AC
  Administered 2012-02-22: 22:00:00 via INTRAVENOUS

## 2012-02-22 MED ORDER — PANTOPRAZOLE SODIUM 40 MG IV SOLR
40.0000 mg | Freq: Once | INTRAVENOUS | Status: AC
  Administered 2012-02-22: 40 mg via INTRAVENOUS
  Filled 2012-02-22: qty 40

## 2012-02-22 MED ORDER — GI COCKTAIL ~~LOC~~
30.0000 mL | Freq: Once | ORAL | Status: AC
  Administered 2012-02-22: 30 mL via ORAL
  Filled 2012-02-22: qty 30

## 2012-02-22 NOTE — ED Notes (Signed)
Earlier today pain in her lower back and nausea. 2 hours ago she got pressure in her chest, vomiting a few times and pressure is getting worse. Points to her epigastric area when she describes area of pain. Gallbladder taken out in November. States she has had GI problems in the past.

## 2012-02-22 NOTE — ED Notes (Signed)
Pt states she vomited GI cocktail approx 5 min after receiving-c/o cont'd epigastric pain-EDP [facility] notified

## 2012-02-22 NOTE — ED Provider Notes (Addendum)
History     CSN: [id number]  Arrival date & time 02/22/12  2037   First MD Initiated Contact with Patient 02/22/12 2140      Chief Complaint  Patient presents with  . Chest Pain    (Consider location/radiation/quality/duration/timing/severity/associated sxs/prior treatment) Patient is a 26 y.o. female presenting with chest pain. The history is provided by the patient.  Chest Pain   She had nausea all day today. This evening at about 1900, she started to having epigastric pain and vomited. The pain radiated to her mid chest and into her back. Pain is sharp and similar to what she had prior to her cholecystectomy. Nothing makes the pain better nothing makes it worse. She describes the pain as severe and rated at 6/10. Pain did not improve any after vomiting. She continues to have nausea and epigastric pain.  Past Medical History  Diagnosis Date  . Recent childbirth 7 1/2 months ago    Past Surgical History  Procedure Date  . Tonsillectomy 2004  . Cholecystectomy 11/12/2011    Procedure: LAPAROSCOPIC CHOLECYSTECTOMY;  Surgeon: Atilano Ina, MD;  Location: [facility];  Service: General;  Laterality: N/A;    Family History  Problem Relation Age of Onset  . Cancer Paternal Grandfather     bladder    History  Substance Use Topics  . Smoking status: Former Games developer  . Smokeless tobacco: Never Used  . Alcohol Use: No    OB History    Grav Para Term Preterm Abortions TAB SAB Ect Mult Living                  Review of Systems  Cardiovascular: Positive for chest pain.  All other systems reviewed and are negative.    Allergies  Shrimp  Home Medications   Current Outpatient Rx  Name Route Sig Dispense Refill  . PRENATAL VITAMINS PO Oral Take by mouth at bedtime.        BP 120/77  Pulse 88  Temp 98 F (36.7 C)  Resp 20  SpO2 100%  Physical Exam  Nursing note and vitals reviewed.  26 year old female appears uncomfortable. Vital signs are normal. Oxygen  saturation is 100% which is normal. Head is normocephalic and atraumatic. PERRLA, EOMI. There is no scleral icterus. Oropharynx is clear. Neck is nontender and supple without adenopathy. Back is nontender. Lungs are clear without rales, wheezes, or rhonchi. Heart has regular rate rhythm without murmur. Abdomen is soft, flat, with moderate epigastric tenderness. There is no other tenderness and no rebound or guarding. Peristalsis is diminished. There's no hepatosplenomegaly. Extremities have no cyanosis or edema, full range of motion is present. Skin is warm and dry without rash. Neurologic: Mental status is normal, cranial nerves are intact, no focal motor or sensory deficits.  ED Course  Procedures (including critical care time)  Results for orders placed during the hospital encounter of 02/22/12  CBC      Component Value Range   WBC 9.3  4.0 - 10.5 (K/uL)   RBC 4.11  3.87 - 5.11 (MIL/uL)   Hemoglobin 12.7  12.0 - 15.0 (g/dL)   HCT 40.9  81.1 - 91.4 (%)   MCV 90.0  78.0 - 100.0 (fL)   MCH 30.9  26.0 - 34.0 (pg)   MCHC 34.3  30.0 - 36.0 (g/dL)   RDW 78.2  95.6 - 21.3 (%)   Platelets 199  150 - 400 (K/uL)  DIFFERENTIAL      Component Value Range  Neutrophils Relative 80 (*) 43 - 77 (%)   Neutro Abs 7.4  1.7 - 7.7 (K/uL)   Lymphocytes Relative 14  12 - 46 (%)   Lymphs Abs 1.3  0.7 - 4.0 (K/uL)   Monocytes Relative 5  3 - 12 (%)   Monocytes Absolute 0.5  0.1 - 1.0 (K/uL)   Eosinophils Relative 1  0 - 5 (%)   Eosinophils Absolute 0.1  0.0 - 0.7 (K/uL)   Basophils Relative 0  0 - 1 (%)   Basophils Absolute 0.0  0.0 - 0.1 (K/uL)  COMPREHENSIVE METABOLIC PANEL      Component Value Range   Sodium 136  135 - 145 (mEq/L)   Potassium 3.4 (*) 3.5 - 5.1 (mEq/L)   Chloride 102  96 - 112 (mEq/L)   CO2 25  19 - 32 (mEq/L)   Glucose, Bld 91  70 - 99 (mg/dL)   BUN 15  6 - 23 (mg/dL)   Creatinine, Ser 1.47  0.50 - 1.10 (mg/dL)   Calcium 8.8  8.4 - 82.9 (mg/dL)   Total Protein 6.5  6.0 - 8.3  (g/dL)   Albumin 4.1  3.5 - 5.2 (g/dL)   AST 18  0 - 37 (U/L)   ALT 11  0 - 35 (U/L)   Alkaline Phosphatase 60  39 - 117 (U/L)   Total Bilirubin 0.4  0.3 - 1.2 (mg/dL)   GFR calc non Af Amer >90  >90 (mL/min)   GFR calc Af Amer >90  >90 (mL/min)  LIPASE, BLOOD      Component Value Range   Lipase 13  11 - 59 (U/L)   Ct Abdomen Pelvis W Contrast  02/23/2012  *RADIOLOGY REPORT*  Clinical Data: Abdominal pain  CT ABDOMEN AND PELVIS WITH CONTRAST  Technique:  Multidetector CT imaging of the abdomen and pelvis was performed following the standard protocol during bolus administration of intravenous contrast.  Contrast: OMNIPAQUE IOHEXOL 300 MG/ML IJ SOLN, 40mL OMNIPAQUE IOHEXOL 300 MG/ML IJ SOLN 100 ml Omnipaque-300  Comparison: 07/12/2011 ultrasound  Findings: Limited images through the lung bases demonstrate no significant appreciable abnormality. The heart size is within normal limits. No pleural or pericardial effusion.  Low attenuation adjacent to the falciform ligament may reflect focal fat or perfusion anomaly.  Absent gallbladder.  No biliary ductal dilatation.  Minimal periportal edema.  Unremarkable spleen, pancreas, adrenal glands.  Mild fullness at the ampulla without obstructing lesion.  No bowel obstruction.  No CT evidence for colitis. The duodenum narrows as it courses between the aorta and SMA however contrast is noted to pass this point into normal caliber proximal jejunal loops. No free intraperitoneal air or fluid.  No lymphadenopathy.  Normal appendix.  IUD in place within the uterus, appears retroverted.  No adnexal mass.  Thin-walled bladder.  Normal caliber vasculature.  No acute osseous abnormality.  IMPRESSION: No acute process identified.  Original Report Authenticated By: Waneta Martins, M.D.      ECG shows normal sinus rhythm with a rate of 78, no ectopy. Normal axis. Normal P wave. Normal QRS. Normal intervals. Normal ST and T waves. Impression: normal ECG. When  compared with ECG of September 06 2010, no significant changes are seen.  She was given a GI cocktail with no relief of pain. She was given IV morphine and pain actually got worse. She's given IV hydromorphone and CT scan has been ordered in light of the failure to respond to initial treatment.   She  was given IV hydromorphone with good relief of pain. CT scan does not show obvious cause for her pain. In light of recent cholecystectomy, if need to consider common bile duct stone and spasm of the sphincter of Odi. Retained his common bile duct stone is considered unlikely in light of normal transaminases and alkaline phosphatase and bilirubin. Further workup will need to be done as an outpatient. She or he has a gastroenterologist and she is advised to make a followup appointment with him. She is sent home with prescription for Percocet and Phenergan.   MDM  Probable episode of GERD. Review of her old charts shows that she had a cholecystectomy on 11/12/2011. There is a possibility of retained common bile duct stone. Laboratory workup was initiated and she will be given a trial of a GI cocktail and proton pump inhibitor as well as an antiemetic.        Dione Booze, MD 02/23/12 Jacinta Shoe  Dione Booze, MD 02/23/12 [phone]

## 2012-02-23 DIAGNOSIS — R109 Unspecified abdominal pain: Secondary | ICD-10-CM

## 2012-02-23 MED ORDER — IOHEXOL 300 MG/ML  SOLN
100.0000 mL | Freq: Once | INTRAMUSCULAR | Status: AC | PRN
  Administered 2012-02-23: 100 mL via INTRAVENOUS

## 2012-02-23 MED ORDER — PROMETHAZINE HCL 25 MG PO TABS: 25.0000 mg | ORAL_TABLET | Freq: Four times a day (QID) | ORAL | Status: DC | PRN

## 2012-02-23 MED ORDER — IOHEXOL 300 MG/ML  SOLN
40.0000 mL | Freq: Once | INTRAMUSCULAR | Status: AC | PRN
  Administered 2012-02-22: 40 mL via ORAL

## 2012-02-23 MED ORDER — OXYCODONE-ACETAMINOPHEN 5-325 MG PO TABS: 1.0000 | ORAL_TABLET | ORAL | Status: AC | PRN

## 2012-02-23 NOTE — ED Notes (Signed)
States she feels much better.

## 2012-07-05 ENCOUNTER — Encounter (HOSPITAL_COMMUNITY): Payer: Self-pay | Admitting: Obstetrics and Gynecology

## 2012-07-05 ENCOUNTER — Inpatient Hospital Stay (HOSPITAL_COMMUNITY)
Admission: AD | Admit: 2012-07-05 | Discharge: 2012-07-05 | Disposition: A | Discharge status: Home / Self Care | Payer: Federal, State, Local not specified - PPO | Source: Ambulatory Visit | Attending: Obstetrics and Gynecology | Admitting: Obstetrics and Gynecology

## 2012-07-05 DIAGNOSIS — N83209 Unspecified ovarian cyst, unspecified side: Secondary | ICD-10-CM

## 2012-07-05 DIAGNOSIS — N949 Unspecified condition associated with female genital organs and menstrual cycle: Secondary | ICD-10-CM | POA: Insufficient documentation

## 2012-07-05 HISTORY — DX: Depression, unspecified: F32.A

## 2012-07-05 HISTORY — DX: Anxiety disorder, unspecified: F41.9

## 2012-07-05 HISTORY — DX: Major depressive disorder, single episode, unspecified: F32.9

## 2012-07-05 LAB — URINALYSIS, ROUTINE W REFLEX MICROSCOPIC
Bilirubin Urine: NEGATIVE
Ketones, ur: NEGATIVE mg/dL
Nitrite: NEGATIVE
Protein, ur: NEGATIVE mg/dL
Specific Gravity, Urine: <1.005 — ABNORMAL LOW (ref 1.005–1.030)
Urobilinogen, UA: 0.2 mg/dL (ref 0.0–1.0)

## 2012-07-05 MED ORDER — KETOROLAC TROMETHAMINE 60 MG/2ML IM SOLN
60.0000 mg | Freq: Once | INTRAMUSCULAR | Status: AC
  Administered 2012-07-05: 60 mg via INTRAMUSCULAR
  Filled 2012-07-05: qty 2

## 2012-07-05 MED ORDER — NAPROXEN SODIUM 550 MG PO TABS: 550.0000 mg | ORAL_TABLET | Freq: Two times a day (BID) | ORAL | Status: AC

## 2012-07-05 NOTE — MAU Note (Signed)
Pt reports last Sunday she had an ovarian cyst rupture. Went to OB/GYN office and they said there was a lot of fluid around it. Told to come back if she was not getting any better. Reports pain is worse and she feels like she has a lot of abd swelling

## 2012-07-05 NOTE — MAU Provider Note (Signed)
History     CSN: [id number]  Arrival date and time: 07/05/12 1120   First Provider Initiated Contact with Patient 07/05/12 1154      Chief Complaint  Patient presents with  . Ovarian Cyst   HPI Brenda Webb is a 26 y.o. female who presents to MAU for pelvic pain.the pain is located in the lowe pelvic area and more on the left side. the pain radiates to the left back.  Associated symptoms include nausea and dizziness.  Patient states she was evaluated in the office by Dr. Dareen Piano 5 days ago and had ultrasound that showed a cyst that was ruptured. She was given Vicodin for pain. She does not like taking narcotics so she has only been taking half of a pill when the pain is really bad. She describes the pain as a continuous dull ache with occasional sharp pain. She rates the dull pain as 5/10 and the sharp pain as 8/10. IUD for birth control. The history was provided by the patient.  OB History    Grav Para Term Preterm Abortions TAB SAB Ect Mult Living   2 1 1  0 1 0 1 0 0 1      Past Medical History  Diagnosis Date  . Recent childbirth 7 1/2 months ago  . Depression     Past Surgical History  Procedure Date  . Tonsillectomy 2004  . Cholecystectomy 11/12/2011    Procedure: LAPAROSCOPIC CHOLECYSTECTOMY;  Surgeon: Atilano Ina, MD;  Location: [facility];  Service: General;  Laterality: N/A;    Family History  Problem Relation Age of Onset  . Cancer Paternal Grandfather     bladder    History  Substance Use Topics  . Smoking status: Former Games developer  . Smokeless tobacco: Never Used  . Alcohol Use: No    Allergies:  Allergies  Allergen Reactions  . Shrimp (Shellfish Allergy) Swelling    Prescriptions prior to admission  Medication Sig Dispense Refill  . HYDROcodone-acetaminophen (VICODIN) 5-500 MG per tablet Take 1 tablet by mouth every 6 (six) hours as needed. pain      . Prenatal Vit-Fe Fumarate-FA (PRENATAL MULTIVITAMIN) TABS Take 1 tablet by mouth daily.      .  sertraline (ZOLOFT) 25 MG tablet Take 25 mg by mouth daily.        Review of Systems  Constitutional: Positive for chills. Negative for fever and weight loss.  HENT: Negative for ear pain, nosebleeds, congestion, sore throat and neck pain.   Eyes: Negative for blurred vision, double vision, photophobia and pain.  Respiratory: Negative for cough, shortness of breath and wheezing.   Cardiovascular: Negative for chest pain, palpitations and leg swelling.  Gastrointestinal: Positive for nausea and abdominal pain. Negative for heartburn, vomiting, diarrhea and constipation.  Genitourinary: Negative for dysuria, urgency and frequency.  Musculoskeletal: Positive for back pain. Negative for myalgias.  Skin: Negative for itching and rash.  Neurological: Positive for dizziness. Negative for sensory change, speech change, seizures, weakness and headaches.  Endo/Heme/Allergies: Does not bruise/bleed easily.  Psychiatric/Behavioral: Negative for depression. The patient is not nervous/anxious and does not have insomnia.    Physical Exam   Blood pressure 125/73, pulse 90, temperature 99.2 F (37.3 C), temperature source Oral, resp. rate 20, height 5\' 8"  (1.727 m), weight 159 lb (72.122 kg), last menstrual period 06/19/2012.  Physical Exam  Constitutional: She is oriented to person, place, and time. She appears well-developed and well-nourished. No distress.  HENT:  Head: Normocephalic and atraumatic.  Eyes: EOM are normal.  Neck: Neck supple.  Cardiovascular: Normal rate.   Respiratory: Effort normal.  GI: Soft. There is tenderness in the left lower quadrant. There is no rigidity, no rebound, no guarding and no CVA tenderness.  Genitourinary: Vagina normal.       Pelvic exam not repeated.  Musculoskeletal: Normal range of motion. She exhibits no edema.  Neurological: She is alert and oriented to person, place, and time.  Skin: Skin is warm and dry.  Psychiatric: She has a normal mood and affect.  Her behavior is normal. Judgment and thought content normal.   Results for orders placed during the hospital encounter of 07/05/12 (from the past 24 hour(s))  URINALYSIS, ROUTINE W REFLEX MICROSCOPIC     Status: Abnormal   Collection Time   07/05/12 11:33 AM      Component Value Range   Color, Urine YELLOW  YELLOW   APPearance CLEAR  CLEAR   Specific Gravity, Urine <1.005 (*) 1.005 - 1.030   pH 6.5  5.0 - 8.0   Glucose, UA NEGATIVE  NEGATIVE mg/dL   Hgb urine dipstick NEGATIVE  NEGATIVE   Bilirubin Urine NEGATIVE  NEGATIVE   Ketones, ur NEGATIVE  NEGATIVE mg/dL   Protein, ur NEGATIVE  NEGATIVE mg/dL   Urobilinogen, UA 0.2  0.0 - 1.0 mg/dL   Nitrite NEGATIVE  NEGATIVE   Leukocytes, UA NEGATIVE  NEGATIVE  POCT PREGNANCY, URINE     Status: Normal   Collection Time   07/05/12 11:42 AM      Component Value Range   Preg Test, Ur NEGATIVE  NEGATIVE   Assessment: Pelvic pain   Ruptured ovarian cyst by history  Plan:  Toradol 60 mg IM   If Patient improves will d/c home with Rx for Anaprox and continued Vicodin as needed   Patient to follow up in the office. MAU Course 12:12 pm discussed with Dr. Dareen Piano and he agrees with plan of care.  Procedures 12:50 pm after toradol the patient is feeling some better. Discussed with the patient need for medication for inflammation in addition to narcotic pain medication and need to take medication as directed.  I have reviewed this patient's vital signs, nurses notes and appropriate labs. I have discussed results and plan of care with the patient and need for follow up with Dr. Dareen Piano. Patient voices understanding.    Ayan Yankey, RN, FNP, [facility] 07/05/2012, 11:55 AM

## 2012-09-30 ENCOUNTER — Other Ambulatory Visit: Payer: Self-pay | Admitting: Gastroenterology

## 2012-09-30 DIAGNOSIS — R109 Unspecified abdominal pain: Secondary | ICD-10-CM

## 2012-10-01 ENCOUNTER — Ambulatory Visit
Admission: RE | Admit: 2012-10-01 | Discharge: 2012-10-01 | Disposition: A | Discharge status: Home / Self Care | Payer: Federal, State, Local not specified - PPO | Source: Ambulatory Visit | Attending: Gastroenterology | Admitting: Gastroenterology

## 2012-10-01 DIAGNOSIS — R109 Unspecified abdominal pain: Secondary | ICD-10-CM

## 2012-10-01 MED ORDER — IOHEXOL 300 MG/ML  SOLN
100.0000 mL | Freq: Once | INTRAMUSCULAR | Status: AC | PRN
  Administered 2012-10-01: 100 mL via INTRAVENOUS

## 2013-04-28 ENCOUNTER — Other Ambulatory Visit: Payer: Self-pay | Admitting: Obstetrics and Gynecology

## 2014-05-21 ENCOUNTER — Emergency Department (HOSPITAL_BASED_OUTPATIENT_CLINIC_OR_DEPARTMENT_OTHER): Payer: 59

## 2014-05-21 ENCOUNTER — Encounter (HOSPITAL_BASED_OUTPATIENT_CLINIC_OR_DEPARTMENT_OTHER): Payer: Self-pay | Admitting: Emergency Medicine

## 2014-05-21 ENCOUNTER — Emergency Department (HOSPITAL_BASED_OUTPATIENT_CLINIC_OR_DEPARTMENT_OTHER)
Admission: EM | Admit: 2014-05-21 | Discharge: 2014-05-21 | Disposition: A | Discharge status: Home / Self Care | Payer: 59 | Attending: Emergency Medicine | Admitting: Emergency Medicine

## 2014-05-21 DIAGNOSIS — R112 Nausea with vomiting, unspecified: Secondary | ICD-10-CM | POA: Insufficient documentation

## 2014-05-21 DIAGNOSIS — F411 Generalized anxiety disorder: Secondary | ICD-10-CM | POA: Insufficient documentation

## 2014-05-21 DIAGNOSIS — Z9089 Acquired absence of other organs: Secondary | ICD-10-CM | POA: Insufficient documentation

## 2014-05-21 DIAGNOSIS — R109 Unspecified abdominal pain: Secondary | ICD-10-CM | POA: Insufficient documentation

## 2014-05-21 DIAGNOSIS — Z87891 Personal history of nicotine dependence: Secondary | ICD-10-CM | POA: Insufficient documentation

## 2014-05-21 DIAGNOSIS — Z3202 Encounter for pregnancy test, result negative: Secondary | ICD-10-CM | POA: Insufficient documentation

## 2014-05-21 DIAGNOSIS — F329 Major depressive disorder, single episode, unspecified: Secondary | ICD-10-CM | POA: Insufficient documentation

## 2014-05-21 DIAGNOSIS — Z79899 Other long term (current) drug therapy: Secondary | ICD-10-CM | POA: Insufficient documentation

## 2014-05-21 DIAGNOSIS — Z872 Personal history of diseases of the skin and subcutaneous tissue: Secondary | ICD-10-CM | POA: Insufficient documentation

## 2014-05-21 DIAGNOSIS — F3289 Other specified depressive episodes: Secondary | ICD-10-CM | POA: Insufficient documentation

## 2014-05-21 HISTORY — DX: Arthropathic psoriasis, unspecified: L40.50

## 2014-05-21 LAB — CBC WITH DIFFERENTIAL/PLATELET
Basophils Absolute: 0.1 10*3/uL (ref 0.0–0.1)
Basophils Relative: 1 % (ref 0–1)
Eosinophils Absolute: 0.2 10*3/uL (ref 0.0–0.7)
Eosinophils Relative: 3 % (ref 0–5)
HEMATOCRIT: 42.4 % (ref 36.0–46.0)
HEMOGLOBIN: 14.5 g/dL (ref 12.0–15.0)
LYMPHS PCT: 34 % (ref 12–46)
Lymphs Abs: 2.8 10*3/uL (ref 0.7–4.0)
MCH: 32.5 pg (ref 26.0–34.0)
MCHC: 34.2 g/dL (ref 30.0–36.0)
MCV: 95.1 fL (ref 78.0–100.0)
MONO ABS: 0.6 10*3/uL (ref 0.1–1.0)
MONOS PCT: 8 % (ref 3–12)
NEUTROS ABS: 4.6 10*3/uL (ref 1.7–7.7)
Neutrophils Relative %: 56 % (ref 43–77)
Platelets: 232 10*3/uL (ref 150–400)
RBC: 4.46 MIL/uL (ref 3.87–5.11)
RDW: 12.4 % (ref 11.5–15.5)
WBC: 8.3 10*3/uL (ref 4.0–10.5)

## 2014-05-21 LAB — URINE MICROSCOPIC-ADD ON

## 2014-05-21 LAB — URINALYSIS, ROUTINE W REFLEX MICROSCOPIC
BILIRUBIN URINE: NEGATIVE
Glucose, UA: NEGATIVE mg/dL
Ketones, ur: NEGATIVE mg/dL
Leukocytes, UA: NEGATIVE
NITRITE: NEGATIVE
Protein, ur: NEGATIVE mg/dL
SPECIFIC GRAVITY, URINE: 1.005 (ref 1.005–1.030)
UROBILINOGEN UA: 0.2 mg/dL (ref 0.0–1.0)
pH: 6.5 (ref 5.0–8.0)

## 2014-05-21 LAB — COMPREHENSIVE METABOLIC PANEL
ALT: 17 U/L (ref 0–35)
AST: 17 U/L (ref 0–37)
Albumin: 4.4 g/dL (ref 3.5–5.2)
Alkaline Phosphatase: 37 U/L — ABNORMAL LOW (ref 39–117)
BILIRUBIN TOTAL: 0.4 mg/dL (ref 0.3–1.2)
BUN: 9 mg/dL (ref 6–23)
CHLORIDE: 102 meq/L (ref 96–112)
CO2: 25 mEq/L (ref 19–32)
Calcium: 10.1 mg/dL (ref 8.4–10.5)
Creatinine, Ser: 0.8 mg/dL (ref 0.50–1.10)
GFR calc Af Amer: >90 mL/min (ref >90)
GFR calc non Af Amer: >90 mL/min (ref >90)
GLUCOSE: 90 mg/dL (ref 70–99)
Potassium: 4.2 mEq/L (ref 3.7–5.3)
Sodium: 141 mEq/L (ref 137–147)
Total Protein: 7.6 g/dL (ref 6.0–8.3)

## 2014-05-21 LAB — LIPASE, BLOOD: Lipase: 18 U/L (ref 11–59)

## 2014-05-21 LAB — PREGNANCY, URINE: Preg Test, Ur: NEGATIVE

## 2014-05-21 MED ORDER — HYDROMORPHONE HCL PF 1 MG/ML IJ SOLN
1.0000 mg | Freq: Once | INTRAMUSCULAR | Status: AC
  Administered 2014-05-21: 1 mg via INTRAVENOUS
  Filled 2014-05-21: qty 1

## 2014-05-21 MED ORDER — ONDANSETRON HCL 4 MG/2ML IJ SOLN
4.0000 mg | Freq: Once | INTRAMUSCULAR | Status: AC
  Administered 2014-05-21: 4 mg via INTRAVENOUS
  Filled 2014-05-21: qty 2

## 2014-05-21 MED ORDER — IOHEXOL 300 MG/ML  SOLN
50.0000 mL | Freq: Once | INTRAMUSCULAR | Status: AC | PRN
  Administered 2014-05-21: 50 mL via ORAL

## 2014-05-21 MED ORDER — IOHEXOL 300 MG/ML  SOLN
100.0000 mL | Freq: Once | INTRAMUSCULAR | Status: AC | PRN
  Administered 2014-05-21: 100 mL via INTRAVENOUS

## 2014-05-21 MED ORDER — PANTOPRAZOLE SODIUM 20 MG PO TBEC: 20.0000 mg | DELAYED_RELEASE_TABLET | Freq: Every day | ORAL | Status: DC

## 2014-05-21 MED ORDER — SODIUM CHLORIDE 0.9 % IV BOLUS (SEPSIS)
1000.0000 mL | Freq: Once | INTRAVENOUS | Status: AC
  Administered 2014-05-21: 1000 mL via INTRAVENOUS

## 2014-05-21 NOTE — ED Notes (Signed)
Patient transported to Ultrasound 

## 2014-05-21 NOTE — Discharge Instructions (Signed)
Abdominal Pain, Women °Abdominal (stomach, pelvic, or belly) pain can be caused by many things. It is important to tell your doctor: °· The location of the pain. °· Does it come and go or is it present all the time? °· Are there things that start the pain (eating certain foods, exercise)? °· Are there other symptoms associated with the pain (fever, nausea, vomiting, diarrhea)? °All of this is helpful to know when trying to find the cause of the pain. °CAUSES  °· Stomach: virus or bacteria infection, or ulcer. °· Intestine: appendicitis (inflamed appendix), regional ileitis (Crohn's disease), ulcerative colitis (inflamed colon), irritable bowel syndrome, diverticulitis (inflamed diverticulum of the colon), or cancer of the stomach or intestine. °· Gallbladder disease or stones in the gallbladder. °· Kidney disease, kidney stones, or infection. °· Pancreas infection or cancer. °· Fibromyalgia (pain disorder). °· Diseases of the female organs: °· Uterus: fibroid (non-cancerous) tumors or infection. °· Fallopian tubes: infection or tubal pregnancy. °· Ovary: cysts or tumors. °· Pelvic adhesions (scar tissue). °· Endometriosis (uterus lining tissue growing in the pelvis and on the pelvic organs). °· Pelvic congestion syndrome (female organs filling up with blood just before the menstrual period). °· Pain with the menstrual period. °· Pain with ovulation (producing an egg). °· Pain with an IUD (intrauterine device, birth control) in the uterus. °· Cancer of the female organs. °· Functional pain (pain not caused by a disease, may improve without treatment). °· Psychological pain. °· Depression. °DIAGNOSIS  °Your doctor will decide the seriousness of your pain by doing an examination. °· Blood tests. °· X-rays. °· Ultrasound. °· CT scan (computed tomography, special type of X-Duane Trias). °· MRI (magnetic resonance imaging). °· Cultures, for infection. °· Barium enema (dye inserted in the large intestine, to better view it with  X-rays). °· Colonoscopy (looking in intestine with a lighted tube). °· Laparoscopy (minor surgery, looking in abdomen with a lighted tube). °· Major abdominal exploratory surgery (looking in abdomen with a large incision). °TREATMENT  °The treatment will depend on the cause of the pain.  °· Many cases can be observed and treated at home. °· Over-the-counter medicines recommended by your caregiver. °· Prescription medicine. °· Antibiotics, for infection. °· Birth control pills, for painful periods or for ovulation pain. °· Hormone treatment, for endometriosis. °· Nerve blocking injections. °· Physical therapy. °· Antidepressants. °· Counseling with a psychologist or psychiatrist. °· Minor or major surgery. °HOME CARE INSTRUCTIONS  °· Do not take laxatives, unless directed by your caregiver. °· Take over-the-counter pain medicine only if ordered by your caregiver. Do not take aspirin because it can cause an upset stomach or bleeding. °· Try a clear liquid diet (broth or water) as ordered by your caregiver. Slowly move to a bland diet, as tolerated, if the pain is related to the stomach or intestine. °· Have a thermometer and take your temperature several times a day, and record it. °· Bed rest and sleep, if it helps the pain. °· Avoid sexual intercourse, if it causes pain. °· Avoid stressful situations. °· Keep your follow-up appointments and tests, as your caregiver orders. °· If the pain does not go away with medicine or surgery, you may try: °· Acupuncture. °· Relaxation exercises (yoga, meditation). °· Group therapy. °· Counseling. °SEEK MEDICAL CARE IF:  °· You notice certain foods cause stomach pain. °· Your home care treatment is not helping your pain. °· You need stronger pain medicine. °· You want your IUD removed. °· You feel faint or   lightheaded. °· You develop nausea and vomiting. °· You develop a rash. °· You are having side effects or an allergy to your medicine. °SEEK IMMEDIATE MEDICAL CARE IF:  °· Your  pain does not go away or gets worse. °· You have a fever. °· Your pain is felt only in portions of the abdomen. The right side could possibly be appendicitis. The left lower portion of the abdomen could be colitis or diverticulitis. °· You are passing blood in your stools (bright red or black tarry stools, with or without vomiting). °· You have blood in your urine. °· You develop chills, with or without a fever. °· You pass out. °MAKE SURE YOU:  °· Understand these instructions. °· Will watch your condition. °· Will get help right away if you are not doing well or get worse. °Document Released: 09/30/2007 Document Revised: 02/25/2012 Document Reviewed: 10/20/2009 °[employer]® Patient Information ©2014 [employer]. ° °

## 2014-05-21 NOTE — ED Notes (Signed)
Patient transported to CT 

## 2014-05-21 NOTE — ED Notes (Signed)
Pt c/o RUQ nonradiating pain x 1 wk and feels like "a lump", tender, had 1 episode of emesis today and continues to feel nauseated. Denies fever.

## 2014-05-21 NOTE — ED Provider Notes (Signed)
CSN: [id number]     Arrival date & time 05/21/14  1047 History   First MD Initiated Contact with Patient 05/21/14 1055     Chief Complaint  Patient presents with  . Abdominal Pain     (Consider location/radiation/quality/duration/timing/severity/associated sxs/prior Treatment) HPI 28 y.o. Female complaining of ruq pain for one week.  Worsening since last night.  Pain is sharp and crampy and in right side of abdomen.  It is similar to pain like she had before she had her gallbladder taken out two years ago.  She has had some nausea and vomited today.  She has been eating a little less and food makes the nausea worse but does not seem to bring on the pain.  She ate pizza last night.  She has had normal bowel movement, menses normal, no fever or chills, no uti or pelvic infection symptoms.  She is on birth control pills and is finishing her mentrual cycle. Patient feels like there is a lump in her ruq that is tender.    Past Medical History  Diagnosis Date  . Recent childbirth 7 1/2 months ago  . Depression   . Anxiety   . Psoriatic arthritis    Past Surgical History  Procedure Laterality Date  . Tonsillectomy  2004  . Cholecystectomy  11/12/2011    Procedure: LAPAROSCOPIC CHOLECYSTECTOMY;  Surgeon: Atilano Ina, MD;  Location: [facility];  Service: General;  Laterality: N/A;   Family History  Problem Relation Age of Onset  . Cancer Paternal Grandfather     bladder   History  Substance Use Topics  . Smoking status: Former Games developer  . Smokeless tobacco: Never Used  . Alcohol Use: No   OB History   Grav Para Term Preterm Abortions TAB SAB Ect Mult Living   2 1 1  0 1 0 1 0 0 1     Review of Systems  All other systems reviewed and are negative.     Allergies  Shrimp  Home Medications   Prior to Admission medications   Medication Sig Start Date End Date Taking? Authorizing Provider  InFLIXimab (REMICADE IV) Inject into the vein.   Yes Historical Provider, MD  TRAMADOL HCL PO  Take by mouth.   Yes Historical Provider, MD  HYDROcodone-acetaminophen (VICODIN) 5-500 MG per tablet Take 1 tablet by mouth every 6 (six) hours as needed. pain    Historical Provider, MD  Prenatal Vit-Fe Fumarate-FA (PRENATAL MULTIVITAMIN) TABS Take 1 tablet by mouth daily.    Historical Provider, MD  sertraline (ZOLOFT) 25 MG tablet Take 25 mg by mouth daily.    Historical Provider, MD   BP 114/60  Pulse 84  Temp(Src) 97.9 F (36.6 C) (Oral)  Resp 16  SpO2 100%  LMP 05/16/2014 Physical Exam  Nursing note and vitals reviewed. Constitutional: She is oriented to person, place, and time. She appears well-developed and well-nourished.  HENT:  Head: Normocephalic and atraumatic.  Right Ear: External ear normal.  Left Ear: External ear normal.  Nose: Nose normal.  Mouth/Throat: Oropharynx is clear and moist.  Eyes: Conjunctivae and EOM are normal. Pupils are equal, round, and reactive to light.  Neck: Normal range of motion. Neck supple. No JVD present. No tracheal deviation present. No thyromegaly present.  Cardiovascular: Normal rate, regular rhythm, normal heart sounds and intact distal pulses.   Pulmonary/Chest: Effort normal and breath sounds normal. No respiratory distress. She has no wheezes.  Abdominal: Soft. Bowel sounds are normal. She exhibits no mass. There  is tenderness. There is no guarding.    Musculoskeletal: Normal range of motion.  Lymphadenopathy:    She has no cervical adenopathy.  Neurological: She is alert and oriented to person, place, and time. She has normal reflexes. No cranial nerve deficit or sensory deficit. Gait normal. GCS eye subscore is 4. GCS verbal subscore is 5. GCS motor subscore is 6.  Reflex Scores:      Bicep reflexes are 2+ on the right side and 2+ on the left side.      Patellar reflexes are 2+ on the right side and 2+ on the left side. Strength is 5/5 bilateral elbow flexor/extensors, wrist extension/flexion, intrinsic hand strength  equal Bilateral hip flexion/extension 5/5, knee flexion/extension 5/5, ankle 5/5 flexion extension    Skin: Skin is warm and dry.  Psychiatric: She has a normal mood and affect. Her behavior is normal. Judgment and thought content normal.    ED Course  Procedures (including critical care time) Labs Review Labs Reviewed  URINALYSIS, ROUTINE W REFLEX MICROSCOPIC - Abnormal; Notable for the following:    Hgb urine dipstick MODERATE (*)    All other components within normal limits  COMPREHENSIVE METABOLIC PANEL - Abnormal; Notable for the following:    Alkaline Phosphatase 37 (*)    All other components within normal limits  URINE MICROSCOPIC-ADD ON - Abnormal; Notable for the following:    Squamous Epithelial / LPF FEW (*)    All other components within normal limits  PREGNANCY, URINE  CBC WITH DIFFERENTIAL  LIPASE, BLOOD    Imaging Review Ct Abdomen Pelvis W Contrast  05/21/2014   CLINICAL DATA:  Right upper quadrant abdominal pain for 1 week with nausea.  EXAM: CT ABDOMEN AND PELVIS WITH CONTRAST  TECHNIQUE: Multidetector CT imaging of the abdomen and pelvis was performed using the standard protocol following bolus administration of intravenous contrast.  CONTRAST:  50 mL OMNIPAQUE IOHEXOL 300 MG/ML SOLN, 100 mL OMNIPAQUE IOHEXOL 300 MG/ML SOLN  COMPARISON:  CT abdomen and pelvis 10/01/2012.  FINDINGS: The lung bases are clear. No pleural or pericardial effusion is identified.  Focal fat in the liver along the falciform ligament is unchanged. The liver is otherwise unremarkable. The gallbladder has been removed. The adrenal glands, spleen, pancreas, kidneys and biliary tree all appear normal. Minimal soft tissue fullness at the biliary ampulla of the descending duodenum is unchanged. Uterus, adnexa and urinary bladder all appear normal. The stomach, small and large bowel and appendix are unremarkable. No lymphadenopathy or fluid is identified. No bony abnormality is seen.  IMPRESSION:  Negative examination.  Status post cholecystectomy.   Electronically Signed   By: Drusilla Kannerhomas  Dalessio M.D.   On: 05/21/2014 12:48     EKG Interpretation None      MDM   Final diagnoses:  Abdominal pain   Patient with normal workup here with no cause for right-sided abdominal pain found on CT or ultrasound. Patient will be treated with PPI. Due to her immunosuppressant medications, she is given strict return precautions and advised have close followup. She voices understanding.    Hilario Quarryanielle S Brianna Bennett, MD 05/23/14 [phone]

## 2014-07-13 ENCOUNTER — Encounter (HOSPITAL_COMMUNITY): Payer: Self-pay | Admitting: Emergency Medicine

## 2014-07-13 ENCOUNTER — Emergency Department (HOSPITAL_COMMUNITY): Payer: 59

## 2014-07-13 ENCOUNTER — Emergency Department (HOSPITAL_COMMUNITY)
Admission: EM | Admit: 2014-07-13 | Discharge: 2014-07-13 | Disposition: A | Discharge status: Home / Self Care | Payer: 59 | Attending: Emergency Medicine | Admitting: Emergency Medicine

## 2014-07-13 DIAGNOSIS — F411 Generalized anxiety disorder: Secondary | ICD-10-CM | POA: Diagnosis not present

## 2014-07-13 DIAGNOSIS — Z3202 Encounter for pregnancy test, result negative: Secondary | ICD-10-CM | POA: Diagnosis not present

## 2014-07-13 DIAGNOSIS — Z79899 Other long term (current) drug therapy: Secondary | ICD-10-CM | POA: Diagnosis not present

## 2014-07-13 DIAGNOSIS — F3289 Other specified depressive episodes: Secondary | ICD-10-CM | POA: Diagnosis not present

## 2014-07-13 DIAGNOSIS — F329 Major depressive disorder, single episode, unspecified: Secondary | ICD-10-CM | POA: Insufficient documentation

## 2014-07-13 DIAGNOSIS — R55 Syncope and collapse: Secondary | ICD-10-CM | POA: Insufficient documentation

## 2014-07-13 DIAGNOSIS — Z87891 Personal history of nicotine dependence: Secondary | ICD-10-CM | POA: Insufficient documentation

## 2014-07-13 DIAGNOSIS — M129 Arthropathy, unspecified: Secondary | ICD-10-CM | POA: Insufficient documentation

## 2014-07-13 DIAGNOSIS — R112 Nausea with vomiting, unspecified: Secondary | ICD-10-CM | POA: Diagnosis present

## 2014-07-13 HISTORY — DX: Unspecified osteoarthritis, unspecified site: M19.90

## 2014-07-13 LAB — CBC WITH DIFFERENTIAL/PLATELET
Basophils Absolute: 0 10*3/uL (ref 0.0–0.1)
Basophils Relative: 0 % (ref 0–1)
EOS PCT: 5 % (ref 0–5)
Eosinophils Absolute: 0.3 10*3/uL (ref 0.0–0.7)
HEMATOCRIT: 39.9 % (ref 36.0–46.0)
HEMOGLOBIN: 13.6 g/dL (ref 12.0–15.0)
LYMPHS PCT: 52 % — AB (ref 12–46)
Lymphs Abs: 3.5 10*3/uL (ref 0.7–4.0)
MCH: 32.2 pg (ref 26.0–34.0)
MCHC: 34.1 g/dL (ref 30.0–36.0)
MCV: 94.5 fL (ref 78.0–100.0)
Monocytes Absolute: 0.7 10*3/uL (ref 0.1–1.0)
Monocytes Relative: 10 % (ref 3–12)
NEUTROS ABS: 2.2 10*3/uL (ref 1.7–7.7)
Neutrophils Relative %: 33 % — ABNORMAL LOW (ref 43–77)
Platelets: 209 10*3/uL (ref 150–400)
RBC: 4.22 MIL/uL (ref 3.87–5.11)
RDW: 12.5 % (ref 11.5–15.5)
WBC: 6.7 10*3/uL (ref 4.0–10.5)

## 2014-07-13 LAB — URINALYSIS, ROUTINE W REFLEX MICROSCOPIC
Bilirubin Urine: NEGATIVE
Glucose, UA: NEGATIVE mg/dL
Hgb urine dipstick: NEGATIVE
KETONES UR: NEGATIVE mg/dL
Leukocytes, UA: NEGATIVE
NITRITE: NEGATIVE
Protein, ur: NEGATIVE mg/dL
Specific Gravity, Urine: 1.015 (ref 1.005–1.030)
UROBILINOGEN UA: 0.2 mg/dL (ref 0.0–1.0)
pH: 6 (ref 5.0–8.0)

## 2014-07-13 LAB — POC URINE PREG, ED: Preg Test, Ur: NEGATIVE

## 2014-07-13 LAB — COMPREHENSIVE METABOLIC PANEL
ALT: 19 U/L (ref 0–35)
ANION GAP: 10 (ref 5–15)
AST: 20 U/L (ref 0–37)
Albumin: 3.7 g/dL (ref 3.5–5.2)
Alkaline Phosphatase: 35 U/L — ABNORMAL LOW (ref 39–117)
BILIRUBIN TOTAL: 0.4 mg/dL (ref 0.3–1.2)
BUN: 13 mg/dL (ref 6–23)
CHLORIDE: 103 meq/L (ref 96–112)
CO2: 26 meq/L (ref 19–32)
CREATININE: 0.83 mg/dL (ref 0.50–1.10)
Calcium: 9.1 mg/dL (ref 8.4–10.5)
GFR calc Af Amer: >90 mL/min (ref >90)
Glucose, Bld: 100 mg/dL — ABNORMAL HIGH (ref 70–99)
Potassium: 4.1 mEq/L (ref 3.7–5.3)
Sodium: 139 mEq/L (ref 137–147)
Total Protein: 6.7 g/dL (ref 6.0–8.3)

## 2014-07-13 LAB — LIPASE, BLOOD: LIPASE: 14 U/L (ref 11–59)

## 2014-07-13 MED ORDER — ONDANSETRON HCL 4 MG PO TABS: 4.0000 mg | ORAL_TABLET | Freq: Four times a day (QID) | ORAL | Status: DC | PRN

## 2014-07-13 MED ORDER — TRAMADOL HCL 50 MG PO TABS
50.0000 mg | ORAL_TABLET | Freq: Once | ORAL | Status: AC
  Administered 2014-07-13: 50 mg via ORAL
  Filled 2014-07-13: qty 1

## 2014-07-13 MED ORDER — SODIUM CHLORIDE 0.9 % IV SOLN: 1000.0000 mL | INTRAVENOUS | Status: DC

## 2014-07-13 MED ORDER — ONDANSETRON HCL 4 MG/2ML IJ SOLN
4.0000 mg | Freq: Once | INTRAMUSCULAR | Status: AC
  Administered 2014-07-13: 4 mg via INTRAVENOUS
  Filled 2014-07-13: qty 2

## 2014-07-13 MED ORDER — SODIUM CHLORIDE 0.9 % IV SOLN
1000.0000 mL | Freq: Once | INTRAVENOUS | Status: AC
  Administered 2014-07-13: 1000 mL via INTRAVENOUS

## 2014-07-13 NOTE — ED Provider Notes (Signed)
CSN: [id number]     Arrival date & time 07/13/14  [id number] History   First MD Initiated Contact with Patient 07/13/14 [phone]     Chief Complaint  Patient presents with  . Near Syncope    onset was 8pm. states she passed out  and hit right side of head  . Emesis     (Consider location/radiation/quality/duration/timing/severity/associated sxs/prior Treatment) Patient is a 28 y.o. female presenting with near-syncope and vomiting. The history is provided by the patient and the spouse.  Near Syncope  Emesis She states that she felt fatigued this evening. She went to sleep and woke up noted in nausea. She vomited twice and laid down but then developed more nausea. She went to the bathroom to vomit and passed out. She hit her head and she's not sure is she hit her head because of passing out or she hit her head first and then had loss of consciousness. Husband states that she had very brief loss of consciousness and came to within seconds. He did not witness to her actual fall. She continues to feel weak and lightheaded. She received ondansetron in the ED and nausea has improved. She denies any sick contacts. Of note, she is being treated for psoriatic arthritis with Infliximab.  Past Medical History  Diagnosis Date  . Recent childbirth 7 1/2 months ago  . Depression   . Anxiety   . Psoriatic arthritis   . Arthritis    Past Surgical History  Procedure Laterality Date  . Tonsillectomy  2004  . Cholecystectomy  11/12/2011    Procedure: LAPAROSCOPIC CHOLECYSTECTOMY;  Surgeon: Atilano InaEric M Wilson, MD;  Location: [facility];  Service: General;  Laterality: N/A;   Family History  Problem Relation Age of Onset  . Cancer Paternal Grandfather     bladder   History  Substance Use Topics  . Smoking status: Former Games developermoker  . Smokeless tobacco: Never Used  . Alcohol Use: No   OB History   Grav Para Term Preterm Abortions TAB SAB Ect Mult Living   2 1 1  0 1 0 1 0 0 1     Review of Systems  Cardiovascular:  Positive for near-syncope.  Gastrointestinal: Positive for vomiting.  All other systems reviewed and are negative.     Allergies  Shrimp  Home Medications   Prior to Admission medications   Medication Sig Start Date End Date Taking? Authorizing Provider  InFLIXimab (REMICADE IV) Inject into the vein.   Yes Historical Provider, MD  pantoprazole (PROTONIX) 20 MG tablet Take 1 tablet (20 mg total) by mouth daily. 05/21/14  Yes Hilario Quarryanielle S Ray, MD  Prenatal Vit-Fe Fumarate-FA (PRENATAL MULTIVITAMIN) TABS Take 1 tablet by mouth daily.   Yes Historical Provider, MD  sertraline (ZOLOFT) 25 MG tablet Take 25 mg by mouth daily.   Yes Historical Provider, MD  TRAMADOL HCL PO Take by mouth.   Yes Historical Provider, MD  HYDROcodone-acetaminophen (VICODIN) 5-500 MG per tablet Take 1 tablet by mouth every 6 (six) hours as needed. pain    Historical Provider, MD   BP 135/93  Pulse 72  Temp(Src) 98.3 F (36.8 C) (Oral)  Resp 20  Ht 5\' 8"  (1.727 m)  Wt 150 lb (68.04 kg)  BMI 22.81 kg/m2  SpO2 100%  LMP 07/06/2014 Physical Exam  Nursing note reviewed.  28 year old female, resting comfortably and in no acute distress. Vital signs are significant for borderline hypertension with blood pressure 135/93. Oxygen saturation is 100%, which is normal.  Head is normocephalic and atraumatic. PERRLA, EOMI. Oropharynx is clear. Fundi show no hemorrhage, exudate, or papilledema. Neck is nontender and supple without adenopathy or JVD. Back is nontender and there is no CVA tenderness. Lungs are clear without rales, wheezes, or rhonchi. Chest is nontender. Heart has regular rate and rhythm without murmur. Abdomen is soft, flat, nontender without masses or hepatosplenomegaly and peristalsis is hypoactive. Extremities have no cyanosis or edema, full range of motion is present. Skin is warm and dry without rash. Neurologic: Mental status is normal, cranial nerves are intact, there are no motor or sensory  deficits.  ED Course  Procedures (including critical care time) Labs Review Results for orders placed during the hospital encounter of 07/13/14  CBC WITH DIFFERENTIAL      Result Value Ref Range   WBC 6.7  4.0 - 10.5 K/uL   RBC 4.22  3.87 - 5.11 MIL/uL   Hemoglobin 13.6  12.0 - 15.0 g/dL   HCT 16.1  09.6 - 04.5 %   MCV 94.5  78.0 - 100.0 fL   MCH 32.2  26.0 - 34.0 pg   MCHC 34.1  30.0 - 36.0 g/dL   RDW 40.9  81.1 - 91.4 %   Platelets 209  150 - 400 K/uL   Neutrophils Relative % 33 (*) 43 - 77 %   Neutro Abs 2.2  1.7 - 7.7 K/uL   Lymphocytes Relative 52 (*) 12 - 46 %   Lymphs Abs 3.5  0.7 - 4.0 K/uL   Monocytes Relative 10  3 - 12 %   Monocytes Absolute 0.7  0.1 - 1.0 K/uL   Eosinophils Relative 5  0 - 5 %   Eosinophils Absolute 0.3  0.0 - 0.7 K/uL   Basophils Relative 0  0 - 1 %   Basophils Absolute 0.0  0.0 - 0.1 K/uL  COMPREHENSIVE METABOLIC PANEL      Result Value Ref Range   Sodium 139  137 - 147 mEq/L   Potassium 4.1  3.7 - 5.3 mEq/L   Chloride 103  96 - 112 mEq/L   CO2 26  19 - 32 mEq/L   Glucose, Bld 100 (*) 70 - 99 mg/dL   BUN 13  6 - 23 mg/dL   Creatinine, Ser 7.82  0.50 - 1.10 mg/dL   Calcium 9.1  8.4 - 95.6 mg/dL   Total Protein 6.7  6.0 - 8.3 g/dL   Albumin 3.7  3.5 - 5.2 g/dL   AST 20  0 - 37 U/L   ALT 19  0 - 35 U/L   Alkaline Phosphatase 35 (*) 39 - 117 U/L   Total Bilirubin 0.4  0.3 - 1.2 mg/dL   GFR calc non Af Amer >90  >90 mL/min   GFR calc Af Amer >90  >90 mL/min   Anion gap 10  5 - 15  LIPASE, BLOOD      Result Value Ref Range   Lipase 14  11 - 59 U/L  URINALYSIS, ROUTINE W REFLEX MICROSCOPIC      Result Value Ref Range   Color, Urine YELLOW  YELLOW   APPearance CLEAR  CLEAR   Specific Gravity, Urine 1.015  1.005 - 1.030   pH 6.0  5.0 - 8.0   Glucose, UA NEGATIVE  NEGATIVE mg/dL   Hgb urine dipstick NEGATIVE  NEGATIVE   Bilirubin Urine NEGATIVE  NEGATIVE   Ketones, ur NEGATIVE  NEGATIVE mg/dL   Protein, ur NEGATIVE  NEGATIVE mg/dL  Urobilinogen, UA 0.2  0.0 - 1.0 mg/dL   Nitrite NEGATIVE  NEGATIVE   Leukocytes, UA NEGATIVE  NEGATIVE  POC URINE PREG, ED      Result Value Ref Range   Preg Test, Ur NEGATIVE  NEGATIVE   Imaging Review Ct Head Wo Contrast  07/13/2014   CLINICAL DATA:  Patient passed out in the bathroom.  Emesis.  EXAM: CT HEAD WITHOUT CONTRAST  TECHNIQUE: Contiguous axial images were obtained from the base of the skull through the vertex without intravenous contrast.  COMPARISON:  None.  FINDINGS: Ventricles and sulci appear symmetrical. No mass effect or midline shift. No abnormal extra-axial fluid collections. Gray-white matter junctions are distinct. Basal cisterns are not effaced. No evidence of acute intracranial hemorrhage. No depressed skull fractures. Mucosal thickening in the ethmoid air cells and frontal sinuses with small air-fluid levels in the maxillary antra. Changes likely to represent sinusitis. Mastoid air cells are not opacified.  IMPRESSION: No acute intracranial abnormalities. Probable inflammatory changes in the paranasal sinuses.   Electronically Signed   By: Burman Nieves M.D.   On: 07/13/2014 04:56      EKG Interpretation   Date/Time:  Tuesday July 13 2014 03:28:30 EDT Ventricular Rate:  74 PR Interval:  125 QRS Duration: 81 QT Interval:  368 QTC Calculation: 408 R Axis:   65 Text Interpretation:  Sinus rhythm Normal ECG When compared with ECG of  02/22/2012, No significant change was found Confirmed by [facility]  MD, Derwin Reddy  ([id number]) on 07/13/2014 3:32:34 AM      MDM   Final diagnoses:  Non-intractable vomiting with nausea, vomiting of unspecified type  Syncope, unspecified syncope type    Nausea and vomiting of uncertain cause but likely to be from a viral gastritis. Because of head trauma, she will be sent for CT of her head. Electrolytes have come back normal. Syncope is likely vagal related to vomiting. Orthostatic vital signs have been obtained and are unremarkable. She  will be given IV hydration and reassessed.  She feels much better after about further treatment. Laboratory workup is significant for right shift on WBC differential consistent with viral illness. She is discharged with prescription for ondansetron.  Dione Booze, MD 07/13/14 [phone]

## 2014-07-13 NOTE — Discharge Instructions (Signed)
Nausea and Vomiting °Nausea is a sick feeling that often comes before throwing up (vomiting). Vomiting is a reflex where stomach contents come out of your mouth. Vomiting can cause severe loss of body fluids (dehydration). Children and elderly adults can become dehydrated quickly, especially if they also have diarrhea. Nausea and vomiting are symptoms of a condition or disease. It is important to find the cause of your symptoms. °CAUSES  °· Direct irritation of the stomach lining. This irritation can result from increased acid production (gastroesophageal reflux disease), infection, food poisoning, taking certain medicines (such as nonsteroidal anti-inflammatory drugs), alcohol use, or tobacco use. °· Signals from the brain. These signals could be caused by a headache, heat exposure, an inner ear disturbance, increased pressure in the brain from injury, infection, a tumor, or a concussion, pain, emotional stimulus, or metabolic problems. °· An obstruction in the gastrointestinal tract (bowel obstruction). °· Illnesses such as diabetes, hepatitis, gallbladder problems, appendicitis, kidney problems, cancer, sepsis, atypical symptoms of a heart attack, or eating disorders. °· Medical treatments such as chemotherapy and radiation. °· Receiving medicine that makes you sleep (general anesthetic) during surgery. °DIAGNOSIS °Your caregiver may ask for tests to be done if the problems do not improve after a few days. Tests may also be done if symptoms are severe or if the reason for the nausea and vomiting is not clear. Tests may include: °· Urine tests. °· Blood tests. °· Stool tests. °· Cultures (to look for evidence of infection). °· X-rays or other imaging studies. °Test results can help your caregiver make decisions about treatment or the need for additional tests. °TREATMENT °You need to stay well hydrated. Drink frequently but in small amounts. You may wish to drink water, sports drinks, clear broth, or eat frozen  ice pops or gelatin dessert to help stay hydrated. When you eat, eating slowly may help prevent nausea. There are also some antinausea medicines that may help prevent nausea. °HOME CARE INSTRUCTIONS  °· Take all medicine as directed by your caregiver. °· If you do not have an appetite, do not force yourself to eat. However, you must continue to drink fluids. °· If you have an appetite, eat a normal diet unless your caregiver tells you differently. °¨ Eat a variety of complex carbohydrates (rice, wheat, potatoes, bread), lean meats, yogurt, fruits, and vegetables. °¨ Avoid high-fat foods because they are more difficult to digest. °· Drink enough water and fluids to keep your urine clear or pale yellow. °· If you are dehydrated, ask your caregiver for specific rehydration instructions. Signs of dehydration may include: °¨ Severe thirst. °¨ Dry lips and mouth. °¨ Dizziness. °¨ Dark urine. °¨ Decreasing urine frequency and amount. °¨ Confusion. °¨ Rapid breathing or pulse. °SEEK IMMEDIATE MEDICAL CARE IF:  °· You have blood or brown flecks (like coffee grounds) in your vomit. °· You have black or bloody stools. °· You have a severe headache or stiff neck. °· You are confused. °· You have severe abdominal pain. °· You have chest pain or trouble breathing. °· You do not urinate at least once every 8 hours. °· You develop cold or clammy skin. °· You continue to vomit for longer than 24 to 48 hours. °· You have a fever. °MAKE SURE YOU:  °· Understand these instructions. °· Will watch your condition. °· Will get help right away if you are not doing well or get worse. °Document Released: 12/03/2005 Document Revised: 02/25/2012 Document Reviewed: 05/02/2011 °Exit[employer]® Patient Information ©2015 [employer]. This information is not intended   to replace advice given to you by your health care provider. Make sure you discuss any questions you have with your health care provider. ° °Ondansetron tablets °What is this  medicine? °ONDANSETRON (on DAN se tron) is used to treat nausea and vomiting caused by chemotherapy. It is also used to prevent or treat nausea and vomiting after surgery. °This medicine may be used for other purposes; ask your health care provider or pharmacist if you have questions. °COMMON BRAND NAME(S): Zofran °What should I tell my health care provider before I take this medicine? °They need to know if you have any of these conditions: °-heart disease °-history of irregular heartbeat °-liver disease °-low levels of magnesium or potassium in the blood °-an unusual or allergic reaction to ondansetron, granisetron, other medicines, foods, dyes, or preservatives °-pregnant or trying to get pregnant °-breast-feeding °How should I use this medicine? °Take this medicine by mouth with a glass of water. Follow the directions on your prescription label. Take your doses at regular intervals. Do not take your medicine more often than directed. °Talk to your pediatrician regarding the use of this medicine in children. Special care may be needed. °Overdosage: If you think you have taken too much of this medicine contact a poison control center or emergency room at once. °NOTE: This medicine is only for you. Do not share this medicine with others. °What if I miss a dose? °If you miss a dose, take it as soon as you can. If it is almost time for your next dose, take only that dose. Do not take double or extra doses. °What may interact with this medicine? °Do not take this medicine with any of the following medications: °-apomorphine °-certain medicines for fungal infections like fluconazole, itraconazole, ketoconazole, posaconazole, voriconazole °-cisapride °-dofetilide °-dronedarone °-pimozide °-thioridazine °-ziprasidone °This medicine may also interact with the following medications: °-carbamazepine °-certain medicines for depression, anxiety, or psychotic disturbances °-fentanyl °-linezolid °-MAOIs like Carbex, Eldepryl,  Marplan, Nardil, and Parnate °-methylene blue (injected into a vein) °-other medicines that prolong the QT interval (cause an abnormal heart rhythm) °-phenytoin °-rifampicin °-tramadol °This list may not describe all possible interactions. Give your health care provider a list of all the medicines, herbs, non-prescription drugs, or dietary supplements you use. Also tell them if you smoke, drink alcohol, or use illegal drugs. Some items may interact with your medicine. °What should I watch for while using this medicine? °Check with your doctor or health care professional right away if you have any sign of an allergic reaction. °What side effects may I notice from receiving this medicine? °Side effects that you should report to your doctor or health care professional as soon as possible: °-allergic reactions like skin rash, itching or hives, swelling of the face, lips or tongue °-breathing problems °-confusion °-dizziness °-fast or irregular heartbeat °-feeling faint or lightheaded, falls °-fever and chills °-loss of balance or coordination °-seizures °-sweating °-swelling of the hands or feet °-tightness in the chest °-tremors °-unusually weak or tired °Side effects that usually do not require medical attention (report to your doctor or health care professional if they continue or are bothersome): °-constipation or diarrhea °-headache °This list may not describe all possible side effects. Call your doctor for medical advice about side effects. You may report side effects to FDA at 1-800-FDA-1088. °Where should I keep my medicine? °Keep out of the reach of children. °Store between 2 and 30 degrees C (36 and 86 degrees F). Throw away any unused medicine after the expiration   date. °NOTE: This sheet is a summary. It may not cover all possible information. If you have questions about this medicine, talk to your doctor, pharmacist, or health care provider. °© 2015, [employer]. (2013-09-09 16:27:45) ° °

## 2014-09-18 ENCOUNTER — Emergency Department (HOSPITAL_COMMUNITY)
Admission: EM | Admit: 2014-09-18 | Discharge: 2014-09-18 | Disposition: A | Discharge status: Home / Self Care | Payer: 59 | Attending: Emergency Medicine | Admitting: Emergency Medicine

## 2014-09-18 ENCOUNTER — Encounter (HOSPITAL_COMMUNITY): Payer: Self-pay | Admitting: Emergency Medicine

## 2014-09-18 ENCOUNTER — Emergency Department (HOSPITAL_COMMUNITY): Payer: 59

## 2014-09-18 DIAGNOSIS — R000 Tachycardia, unspecified: Secondary | ICD-10-CM | POA: Insufficient documentation

## 2014-09-18 DIAGNOSIS — F319 Bipolar disorder, unspecified: Secondary | ICD-10-CM | POA: Insufficient documentation

## 2014-09-18 DIAGNOSIS — F17200 Nicotine dependence, unspecified, uncomplicated: Secondary | ICD-10-CM | POA: Diagnosis not present

## 2014-09-18 DIAGNOSIS — Z79899 Other long term (current) drug therapy: Secondary | ICD-10-CM | POA: Diagnosis not present

## 2014-09-18 DIAGNOSIS — J209 Acute bronchitis, unspecified: Secondary | ICD-10-CM | POA: Diagnosis not present

## 2014-09-18 DIAGNOSIS — F419 Anxiety disorder, unspecified: Secondary | ICD-10-CM | POA: Insufficient documentation

## 2014-09-18 DIAGNOSIS — Z720 Tobacco use: Secondary | ICD-10-CM

## 2014-09-18 DIAGNOSIS — Z792 Long term (current) use of antibiotics: Secondary | ICD-10-CM | POA: Insufficient documentation

## 2014-09-18 DIAGNOSIS — Z7952 Long term (current) use of systemic steroids: Secondary | ICD-10-CM | POA: Diagnosis not present

## 2014-09-18 DIAGNOSIS — R0602 Shortness of breath: Secondary | ICD-10-CM | POA: Diagnosis present

## 2014-09-18 DIAGNOSIS — Z8739 Personal history of other diseases of the musculoskeletal system and connective tissue: Secondary | ICD-10-CM | POA: Diagnosis not present

## 2014-09-18 MED ORDER — IPRATROPIUM-ALBUTEROL 0.5-2.5 (3) MG/3ML IN SOLN
3.0000 mL | Freq: Once | RESPIRATORY_TRACT | Status: AC
  Administered 2014-09-18: 3 mL via RESPIRATORY_TRACT
  Filled 2014-09-18: qty 3

## 2014-09-18 MED ORDER — ALBUTEROL SULFATE (2.5 MG/3ML) 0.083% IN NEBU: 5.0000 mg | INHALATION_SOLUTION | Freq: Once | RESPIRATORY_TRACT | Status: DC

## 2014-09-18 MED ORDER — ALBUTEROL SULFATE HFA 108 (90 BASE) MCG/ACT IN AERS
2.0000 | INHALATION_SPRAY | Freq: Once | RESPIRATORY_TRACT | Status: AC
  Administered 2014-09-18: 2 via RESPIRATORY_TRACT
  Filled 2014-09-18: qty 6.7

## 2014-09-18 MED ORDER — HYDROCOD POLST-CHLORPHEN POLST 10-8 MG/5ML PO LQCR: 5.0000 mL | Freq: Two times a day (BID) | ORAL | Status: DC | PRN

## 2014-09-18 MED ORDER — IPRATROPIUM BROMIDE 0.02 % IN SOLN: 0.5000 mg | Freq: Once | RESPIRATORY_TRACT | Status: DC

## 2014-09-18 MED ORDER — ALBUTEROL SULFATE (2.5 MG/3ML) 0.083% IN NEBU
2.5000 mg | INHALATION_SOLUTION | Freq: Once | RESPIRATORY_TRACT | Status: AC
  Administered 2014-09-18: 2.5 mg via RESPIRATORY_TRACT
  Filled 2014-09-18: qty 3

## 2014-09-18 MED ORDER — AZITHROMYCIN 250 MG PO TABS: ORAL_TABLET | ORAL | Status: DC

## 2014-09-18 MED ORDER — PREDNISONE 20 MG PO TABS: 20.0000 mg | ORAL_TABLET | Freq: Two times a day (BID) | ORAL | Status: DC

## 2014-09-18 MED ORDER — PREDNISONE 50 MG PO TABS
60.0000 mg | ORAL_TABLET | Freq: Once | ORAL | Status: AC
  Administered 2014-09-18: 60 mg via ORAL
  Filled 2014-09-18 (×2): qty 1

## 2014-09-18 MED ORDER — AEROCHAMBER Z-STAT PLUS/MEDIUM MISC: 1.0000 | Freq: Once | Status: DC

## 2014-09-18 NOTE — Discharge Instructions (Signed)
Try to stop smoking. Use the inhaler, 2 puffs every 3-4 hours as needed for cough or trouble breathing. Use Robitussin-DM, for cough. Use the narcotic cough medicine if needed, but do not drive or work within 6 hours of taking it.     Acute Bronchitis Bronchitis is inflammation of the airways that extend from the windpipe into the lungs (bronchi). The inflammation often causes mucus to develop. This leads to a cough, which is the most common symptom of bronchitis.  In acute bronchitis, the condition usually develops suddenly and goes away over time, usually in a couple weeks. Smoking, allergies, and asthma can make bronchitis worse. Repeated episodes of bronchitis may cause further lung problems.  CAUSES Acute bronchitis is most often caused by the same virus that causes a cold. The virus can spread from person to person (contagious) through coughing, sneezing, and touching contaminated objects. SIGNS AND SYMPTOMS   Cough.   Fever.   Coughing up mucus.   Body aches.   Chest congestion.   Chills.   Shortness of breath.   Sore throat.  DIAGNOSIS  Acute bronchitis is usually diagnosed through a physical exam. Your health care provider will also ask you questions about your medical history. Tests, such as chest X-rays, are sometimes done to rule out other conditions.  TREATMENT  Acute bronchitis usually goes away in a couple weeks. Oftentimes, no medical treatment is necessary. Medicines are sometimes given for relief of fever or cough. Antibiotic medicines are usually not needed but may be prescribed in certain situations. In some cases, an inhaler may be recommended to help reduce shortness of breath and control the cough. A cool mist vaporizer may also be used to help thin bronchial secretions and make it easier to clear the chest.  HOME CARE INSTRUCTIONS  Get plenty of rest.   Drink enough fluids to keep your urine clear or pale yellow (unless you have a medical  condition that requires fluid restriction). Increasing fluids may help thin your respiratory secretions (sputum) and reduce chest congestion, and it will prevent dehydration.   Take medicines only as directed by your health care provider.  If you were prescribed an antibiotic medicine, finish it all even if you start to feel better.  Avoid smoking and secondhand smoke. Exposure to cigarette smoke or irritating chemicals will make bronchitis worse. If you are a smoker, consider using nicotine gum or skin patches to help control withdrawal symptoms. Quitting smoking will help your lungs heal faster.   Reduce the chances of another bout of acute bronchitis by washing your hands frequently, avoiding people with cold symptoms, and trying not to touch your hands to your mouth, nose, or eyes.   Keep all follow-up visits as directed by your health care provider.  SEEK MEDICAL CARE IF: Your symptoms do not improve after 1 week of treatment.  SEEK IMMEDIATE MEDICAL CARE IF:  You develop an increased fever or chills.   You have chest pain.   You have severe shortness of breath.  You have bloody sputum.   You develop dehydration.  You faint or repeatedly feel like you are going to pass out.  You develop repeated vomiting.  You develop a severe headache. MAKE SURE YOU:   Understand these instructions.  Will watch your condition.  Will get help right away if you are not doing well or get worse. Document Released: 01/10/2005 Document Revised: 04/19/2014 Document Reviewed: 05/26/2013 [facility] Patient Information 2015 [place]. This information is not intended to replace  advice given to you by your health care provider. Make sure you discuss any questions you have with your health care provider.  Smoking Cessation Quitting smoking is important to your health and has many advantages. However, it is not always easy to quit since nicotine is a very addictive drug. Oftentimes,  people try 3 times or more before being able to quit. This document explains the best ways for you to prepare to quit smoking. Quitting takes hard work and a lot of effort, but you can do it. ADVANTAGES OF QUITTING SMOKING  You will live longer, feel better, and live better.  Your body will feel the impact of quitting smoking almost immediately.  Within 20 minutes, blood pressure decreases. Your pulse returns to its normal level.  After 8 hours, carbon monoxide levels in the blood return to normal. Your oxygen level increases.  After 24 hours, the chance of having a heart attack starts to decrease. Your breath, hair, and body stop smelling like smoke.  After 48 hours, damaged nerve endings begin to recover. Your sense of taste and smell improve.  After 72 hours, the body is virtually free of nicotine. Your bronchial tubes relax and breathing becomes easier.  After 2 to 12 weeks, lungs can hold more air. Exercise becomes easier and circulation improves.  The risk of having a heart attack, stroke, cancer, or lung disease is greatly reduced.  After 1 year, the risk of coronary heart disease is cut in half.  After 5 years, the risk of stroke falls to the same as a nonsmoker.  After 10 years, the risk of lung cancer is cut in half and the risk of other cancers decreases significantly.  After 15 years, the risk of coronary heart disease drops, usually to the level of a nonsmoker.  If you are pregnant, quitting smoking will improve your chances of having a healthy baby.  The people you live with, especially any children, will be healthier.  You will have extra money to spend on things other than cigarettes. QUESTIONS TO THINK ABOUT BEFORE ATTEMPTING TO QUIT You may want to talk about your answers with your health care provider.  Why do you want to quit?  If you tried to quit in the past, what helped and what did not?  What will be the most difficult situations for you after you  quit? How will you plan to handle them?  Who can help you through the tough times? Your family? Friends? A health care provider?  What pleasures do you get from smoking? What ways can you still get pleasure if you quit? Here are some questions to ask your health care provider:  How can you help me to be successful at quitting?  What medicine do you think would be best for me and how should I take it?  What should I do if I need more help?  What is smoking withdrawal like? How can I get information on withdrawal? GET READY  Set a quit date.  Change your environment by getting rid of all cigarettes, ashtrays, matches, and lighters in your home, car, or work. Do not let people smoke in your home.  Review your past attempts to quit. Think about what worked and what did not. GET SUPPORT AND ENCOURAGEMENT You have a better chance of being successful if you have help. You can get support in many ways.  Tell your family, friends, and coworkers that you are going to quit and need their support. Ask them  not to smoke around you.  Get individual, group, or telephone counseling and support. Programs are available at [employer] and health centers. Call your local health department for information about programs in your area.  Spiritual beliefs and practices may help some smokers quit.  Download a "quit meter" on your computer to keep track of quit statistics, such as how long you have gone without smoking, cigarettes not smoked, and money saved.  Get a self-help book about quitting smoking and staying off tobacco. LEARN NEW SKILLS AND BEHAVIORS  Distract yourself from urges to smoke. Talk to someone, go for a walk, or occupy your time with a task.  Change your normal routine. Take a different route to work. Drink tea instead of coffee. Eat breakfast in a different place.  Reduce your stress. Take a hot bath, exercise, or read a book.  Plan something enjoyable to do every day. Reward  yourself for not smoking.  Explore interactive web-based programs that specialize in helping you quit. GET MEDICINE AND USE IT CORRECTLY Medicines can help you stop smoking and decrease the urge to smoke. Combining medicine with the above behavioral methods and support can greatly increase your chances of successfully quitting smoking.  Nicotine replacement therapy helps deliver nicotine to your body without the negative effects and risks of smoking. Nicotine replacement therapy includes nicotine gum, lozenges, inhalers, nasal sprays, and skin patches. Some may be available over-the-counter and others require a prescription.  Antidepressant medicine helps people abstain from smoking, but how this works is unknown. This medicine is available by prescription.  Nicotinic receptor partial agonist medicine simulates the effect of nicotine in your brain. This medicine is available by prescription. Ask your health care provider for advice about which medicines to use and how to use them based on your health history. Your health care provider will tell you what side effects to look out for if you choose to be on a medicine or therapy. Carefully read the information on the package. Do not use any other product containing nicotine while using a nicotine replacement product.  RELAPSE OR DIFFICULT SITUATIONS Most relapses occur within the first 3 months after quitting. Do not be discouraged if you start smoking again. Remember, most people try several times before finally quitting. You may have symptoms of withdrawal because your body is used to nicotine. You may crave cigarettes, be irritable, feel very hungry, cough often, get headaches, or have difficulty concentrating. The withdrawal symptoms are only temporary. They are strongest when you first quit, but they will go away within 10-14 days. To reduce the chances of relapse, try to:  Avoid drinking alcohol. Drinking lowers your chances of successfully  quitting.  Reduce the amount of caffeine you consume. Once you quit smoking, the amount of caffeine in your body increases and can give you symptoms, such as a rapid heartbeat, sweating, and anxiety.  Avoid smokers because they can make you want to smoke.  Do not let weight gain distract you. Many smokers will gain weight when they quit, usually less than 10 pounds. Eat a healthy diet and stay active. You can always lose the weight gained after you quit.  Find ways to improve your mood other than smoking. FOR MORE INFORMATION  www.smokefree.gov  Document Released: 11/27/2001 Document Revised: 04/19/2014 Document Reviewed: 03/13/2012 [facility] Patient Information 2015 [place], [place]. This information is not intended to replace advice given to you by your health care provider. Make sure you discuss any questions you have with your health  care provider.  Smoking Hazards Smoking cigarettes is extremely bad for your health. Tobacco smoke has over 200 known poisons in it. It contains the poisonous gases nitrogen oxide and carbon monoxide. There are over 60 chemicals in tobacco smoke that cause cancer. Some of the chemicals found in cigarette smoke include:   Cyanide.   Benzene.   Formaldehyde.   Methanol (wood alcohol).   Acetylene (fuel used in welding torches).   Ammonia.  Even smoking lightly shortens your life expectancy by several years. You can greatly reduce the risk of medical problems for you and your family by stopping now. Smoking is the most preventable cause of death and disease in our society. Within days of quitting smoking, your circulation improves, you decrease the risk of having a heart attack, and your lung capacity improves. There may be some increased phlegm in the first few days after quitting, and it may take months for your lungs to clear up completely. Quitting for 10 years reduces your risk of developing lung cancer to almost that of a nonsmoker.  WHAT ARE THE  RISKS OF SMOKING? Cigarette smokers have an increased risk of many serious medical problems, including:  Lung cancer.   Lung disease (such as pneumonia, bronchitis, and emphysema).   Heart attack and chest pain due to the heart not getting enough oxygen (angina).   Heart disease and peripheral blood vessel disease.   Hypertension.   Stroke.   Oral cancer (cancer of the lip, mouth, or voice box).   Bladder cancer.   Pancreatic cancer.   Cervical cancer.   Pregnancy complications, including premature birth.   Stillbirths and smaller newborn babies, birth defects, and genetic damage to sperm.   Early menopause.   Lower estrogen level for women.   Infertility.   Facial wrinkles.   Blindness.   Increased risk of broken bones (fractures).   Senile dementia.   Stomach ulcers and internal bleeding.   Delayed wound healing and increased risk of complications during surgery. Because of secondhand smoke exposure, children of smokers have an increased risk of the following:   Sudden infant death syndrome (SIDS).   Respiratory infections.   Lung cancer.   Heart disease.   Ear infections.  WHY IS SMOKING ADDICTIVE? Nicotine is the chemical agent in tobacco that is capable of causing addiction or dependence. When you smoke and inhale, nicotine is absorbed rapidly into the bloodstream through your lungs. Both inhaled and noninhaled nicotine may be addictive.  WHAT ARE THE BENEFITS OF QUITTING?  There are many health benefits to quitting smoking. Some are:   The likelihood of developing cancer and heart disease decreases. Health improvements are seen almost immediately.   Blood pressure, pulse rate, and breathing patterns start returning to normal soon after quitting.   People who quit may see an improvement in their overall quality of life.  HOW DO YOU QUIT SMOKING? Smoking is an addiction with both physical and psychological effects, and  longtime habits can be hard to change. Your health care provider can recommend:  Programs and community resources, which may include group support, education, or therapy.  Replacement products, such as patches, gum, and nasal sprays. Use these products only as directed. Do not replace cigarette smoking with electronic cigarettes (commonly called e-cigarettes). The safety of e-cigarettes is unknown, and some may contain harmful chemicals. FOR MORE INFORMATION  American Lung Association: www.lung.org  American Cancer Society: www.cancer.org Document Released: 01/10/2005 Document Revised: 09/23/2013 Document Reviewed: 05/25/2013 [employer]Care Patient Information 2015 [employer]. This information is not intended to replace advice given to you by your health care provider. Make sure you discuss any questions you have with your health care provider.

## 2014-09-18 NOTE — ED Provider Notes (Signed)
CSN: [id number]     Arrival date & time 09/18/14  4098 History  This chart was scribed for Flint Melter, MD by Leone Payor, ED Scribe. This patient was seen in room APA05/APA05 and the patient's care was started 9:30 AM.    Chief Complaint  Patient presents with  . Shortness of Breath   The history is provided by the patient. No language interpreter was used.    HPI Comments: Brenda Webb is a 28 y.o. female who presents to the Emergency Department complaining of several days of a gradual onset, intermittent, cough that gradually worsened last night. She reports associated SOB that began this morning which is present with any physical activity. She states the SOB is worse with coughing. She reports sick contacts with similar symptoms. She denies fever, vomiting. She states she occasionally smokes.   Past Medical History  Diagnosis Date  . Recent childbirth 7 1/2 months ago  . Depression   . Anxiety   . Psoriatic arthritis   . Arthritis    Past Surgical History  Procedure Laterality Date  . Tonsillectomy  2004  . Cholecystectomy  11/12/2011    Procedure: LAPAROSCOPIC CHOLECYSTECTOMY;  Surgeon: Atilano Ina, MD;  Location: [facility];  Service: General;  Laterality: N/A;   Family History  Problem Relation Age of Onset  . Cancer Paternal Grandfather     bladder   History  Substance Use Topics  . Smoking status: Former Games developer  . Smokeless tobacco: Never Used  . Alcohol Use: No   OB History   Grav Para Term Preterm Abortions TAB SAB Ect Mult Living   2 1 1  0 1 0 1 0 0 1     Review of Systems  Constitutional: Negative for fever.  Respiratory: Positive for cough and shortness of breath.   Gastrointestinal: Negative for vomiting.  All other systems reviewed and are negative.     Allergies  Shrimp  Home Medications   Prior to Admission medications   Medication Sig Start Date End Date Taking? Authorizing Provider  Guaifenesin [facility] MAXIMUM STRENGTH) 1200 MG TB12  Take 2 tablets by mouth 2 (two) times daily.   Yes Historical Provider, MD  InFLIXimab (REMICADE IV) Inject into the vein every 6 (six) weeks.    Yes Historical Provider, MD  Norgestimate-Ethinyl Estradiol Triphasic (ORTHO TRI-CYCLEN, 28,) 0.18/0.215/0.25 MG-35 MCG tablet Take 1 tablet by mouth daily.   Yes Historical Provider, MD  Prenatal Vit-Fe Fumarate-FA (PRENATAL MULTIVITAMIN) TABS Take 1 tablet by mouth daily.   Yes Historical Provider, MD  Pseudoeph-Doxylamine-DM-APAP (NYQUIL PO) Take 30 mLs by mouth at bedtime as needed (cough/congestion).   Yes Historical Provider, MD  sertraline (ZOLOFT) 25 MG tablet Take 25 mg by mouth daily.   Yes Historical Provider, MD  traMADol (ULTRAM) 50 MG tablet Take 100 mg by mouth every 4 (four) hours as needed for moderate pain.   Yes Historical Provider, MD  azithromycin (ZITHROMAX Z-PAK) 250 MG tablet 2 po day one, then 1 daily x 4 days 09/18/14   Flint Melter, MD  chlorpheniramine-HYDROcodone [facility] PENNKINETIC ER) 10-8 MG/5ML LQCR Take 5 mLs by mouth every 12 (twelve) hours as needed for cough. 09/18/14   Flint Melter, MD  predniSONE (DELTASONE) 20 MG tablet Take 1 tablet (20 mg total) by mouth 2 (two) times daily. 09/18/14   Flint Melter, MD   BP 102/68  Pulse 87  Temp(Src) 99.2 F (37.3 C) (Oral)  Resp 17  Ht 5\' 7"  (1.702  m)  Wt 155 lb (70.308 kg)  BMI 24.27 kg/m2  SpO2 100%  LMP 09/12/2014 Physical Exam  Nursing note and vitals reviewed. Constitutional: She is oriented to person, place, and time. She appears well-developed and well-nourished.  HENT:  Head: Normocephalic and atraumatic.  Eyes: Conjunctivae and EOM are normal. Pupils are equal, round, and reactive to light.  Neck: Normal range of motion and phonation normal. Neck supple.  Cardiovascular: Regular rhythm.  Tachycardia present.   No murmur heard. Pulmonary/Chest: Effort normal. She has wheezes. She exhibits no tenderness.  Mildly decreased air movement bilaterally  with scattered expiratory wheezes   Abdominal: Soft. She exhibits no distension. There is no tenderness. There is no guarding.  Musculoskeletal: Normal range of motion.  Neurological: She is alert and oriented to person, place, and time. She exhibits normal muscle tone.  Skin: Skin is warm and dry.  Psychiatric: She has a normal mood and affect. Her behavior is normal. Judgment and thought content normal.    ED Course  Procedures   DIAGNOSTIC STUDIES: Oxygen Saturation is 100% on RA, normal by my interpretation.    COORDINATION OF CARE: 9:36 AM Discussed treatment plan with pt at bedside and pt agreed to plan.   Labs Review Labs Reviewed - No data to display  Imaging Review Dg Chest 2 View  09/18/2014   CLINICAL DATA:  SHORTNESS OF BREATH, Intermittent cough and nasal congestion x several days- worse since last night and started coughing up green sputum. Sob, and chest pressure, earache since last night. Stated no prior history of heart or lung conditions, occasional smoker.  EXAM: CHEST - 2 VIEW  COMPARISON:  01/22/2014  FINDINGS: Lungs are clear. Heart size and mediastinal contours are within normal limits. No effusion.  Surgical clips right upper abdomen. Visualized skeletal structures are unremarkable.  IMPRESSION: No acute cardiopulmonary disease.   Electronically Signed   By: Oley Balmaniel  Hassell M.D.   On: 09/18/2014 12:27     EKG Interpretation   Date/Time:  Saturday September 18 2014 09:02:17 EDT Ventricular Rate:  90 PR Interval:  117 QRS Duration: 82 QT Interval:  344 QTC Calculation: 421 R Axis:   64 Text Interpretation:  Sinus rhythm Borderline short PR interval Borderline  repolarization abnormality since last tracing no significant change  Confirmed by Effie ShyWENTZ  MD, Mechele CollinELLIOTT ([id number]) on 09/18/2014 1:28:14 PM      MDM   Final diagnoses:  Acute bronchitis, unspecified organism  Tobacco abuse   Evaluation is consistent with bronchitis caused by tobacco abuse. Possible  mild infectious process associated with green sputum production. No evidence for pneumonia, PE, or ACS.  Nursing Notes Reviewed/ Care Coordinated Applicable Imaging Reviewed Interpretation of Laboratory Data incorporated into ED treatment  The patient appears reasonably screened and/or stabilized for discharge and I doubt any other medical condition or other [facility] requiring further screening, evaluation, or treatment in the ED at this time prior to discharge.  Plan: Home Medications- Prednisone, Zithromax, Albuterol, Tussionex; Home Treatments- Stop smoking; return here if the recommended treatment, does not improve the symptoms; Recommended follow up- PCP prn  I personally performed the services described in this documentation, which was scribed in my presence. The recorded information has been reviewed and is accurate.      Flint MelterElliott L Maudell Stanbrough, MD 09/18/14 [phone]

## 2014-09-18 NOTE — ED Notes (Signed)
Resp Tech notified of new orders.

## 2014-09-18 NOTE — ED Notes (Signed)
Coughing for a couple of day.  Onset of chest pain and sob one hour ago.  Pt crying during triage

## 2014-09-18 NOTE — ED Notes (Signed)
Pt had inhaler and aerochamber upon d/c and stated was directed on use.

## 2014-10-12 ENCOUNTER — Emergency Department (HOSPITAL_BASED_OUTPATIENT_CLINIC_OR_DEPARTMENT_OTHER)
Admission: EM | Admit: 2014-10-12 | Discharge: 2014-10-12 | Disposition: A | Discharge status: Home / Self Care | Payer: 59 | Attending: Emergency Medicine | Admitting: Emergency Medicine

## 2014-10-12 ENCOUNTER — Emergency Department (HOSPITAL_BASED_OUTPATIENT_CLINIC_OR_DEPARTMENT_OTHER): Payer: 59

## 2014-10-12 ENCOUNTER — Encounter (HOSPITAL_BASED_OUTPATIENT_CLINIC_OR_DEPARTMENT_OTHER): Payer: Self-pay | Admitting: Emergency Medicine

## 2014-10-12 DIAGNOSIS — Z9049 Acquired absence of other specified parts of digestive tract: Secondary | ICD-10-CM | POA: Insufficient documentation

## 2014-10-12 DIAGNOSIS — Z87891 Personal history of nicotine dependence: Secondary | ICD-10-CM | POA: Diagnosis not present

## 2014-10-12 DIAGNOSIS — R109 Unspecified abdominal pain: Secondary | ICD-10-CM

## 2014-10-12 DIAGNOSIS — Z87442 Personal history of urinary calculi: Secondary | ICD-10-CM | POA: Insufficient documentation

## 2014-10-12 DIAGNOSIS — Z79899 Other long term (current) drug therapy: Secondary | ICD-10-CM | POA: Insufficient documentation

## 2014-10-12 DIAGNOSIS — Z3202 Encounter for pregnancy test, result negative: Secondary | ICD-10-CM | POA: Insufficient documentation

## 2014-10-12 DIAGNOSIS — Z793 Long term (current) use of hormonal contraceptives: Secondary | ICD-10-CM | POA: Diagnosis not present

## 2014-10-12 DIAGNOSIS — L4050 Arthropathic psoriasis, unspecified: Secondary | ICD-10-CM | POA: Insufficient documentation

## 2014-10-12 DIAGNOSIS — F329 Major depressive disorder, single episode, unspecified: Secondary | ICD-10-CM | POA: Insufficient documentation

## 2014-10-12 DIAGNOSIS — F419 Anxiety disorder, unspecified: Secondary | ICD-10-CM | POA: Insufficient documentation

## 2014-10-12 LAB — CBC WITH DIFFERENTIAL/PLATELET
BASOS ABS: 0 10*3/uL (ref 0.0–0.1)
BASOS PCT: 0 % (ref 0–1)
EOS ABS: 0.2 10*3/uL (ref 0.0–0.7)
EOS PCT: 2 % (ref 0–5)
HCT: 40.2 % (ref 36.0–46.0)
Hemoglobin: 13.3 g/dL (ref 12.0–15.0)
Lymphocytes Relative: 31 % (ref 12–46)
Lymphs Abs: 2.8 10*3/uL (ref 0.7–4.0)
MCH: 31.6 pg (ref 26.0–34.0)
MCHC: 33.1 g/dL (ref 30.0–36.0)
MCV: 95.5 fL (ref 78.0–100.0)
Monocytes Absolute: 0.8 10*3/uL (ref 0.1–1.0)
Monocytes Relative: 9 % (ref 3–12)
Neutro Abs: 5.3 10*3/uL (ref 1.7–7.7)
Neutrophils Relative %: 58 % (ref 43–77)
PLATELETS: 200 10*3/uL (ref 150–400)
RBC: 4.21 MIL/uL (ref 3.87–5.11)
RDW: 12.1 % (ref 11.5–15.5)
WBC: 9.1 10*3/uL (ref 4.0–10.5)

## 2014-10-12 LAB — BASIC METABOLIC PANEL
ANION GAP: 12 (ref 5–15)
BUN: 11 mg/dL (ref 6–23)
CALCIUM: 9.2 mg/dL (ref 8.4–10.5)
CO2: 26 mEq/L (ref 19–32)
Chloride: 100 mEq/L (ref 96–112)
Creatinine, Ser: 0.8 mg/dL (ref 0.50–1.10)
GFR calc Af Amer: >90 mL/min (ref >90)
Glucose, Bld: 83 mg/dL (ref 70–99)
Potassium: 3.9 mEq/L (ref 3.7–5.3)
SODIUM: 138 meq/L (ref 137–147)

## 2014-10-12 LAB — URINALYSIS, ROUTINE W REFLEX MICROSCOPIC
BILIRUBIN URINE: NEGATIVE
Glucose, UA: NEGATIVE mg/dL
Ketones, ur: NEGATIVE mg/dL
Leukocytes, UA: NEGATIVE
Nitrite: NEGATIVE
Protein, ur: NEGATIVE mg/dL
Specific Gravity, Urine: 1.008 (ref 1.005–1.030)
UROBILINOGEN UA: 0.2 mg/dL (ref 0.0–1.0)
pH: 6.5 (ref 5.0–8.0)

## 2014-10-12 LAB — PREGNANCY, URINE: PREG TEST UR: NEGATIVE

## 2014-10-12 LAB — URINE MICROSCOPIC-ADD ON

## 2014-10-12 MED ORDER — HYDROMORPHONE HCL 1 MG/ML IJ SOLN
1.0000 mg | Freq: Once | INTRAMUSCULAR | Status: AC
  Administered 2014-10-12: 1 mg via INTRAVENOUS
  Filled 2014-10-12: qty 1

## 2014-10-12 MED ORDER — SODIUM CHLORIDE 0.9 % IV SOLN
INTRAVENOUS | Status: DC
Start: 2014-10-12 — End: 2014-10-12
  Administered 2014-10-12: 10:00:00 via INTRAVENOUS

## 2014-10-12 MED ORDER — ONDANSETRON 4 MG PO TBDP: 4.0000 mg | ORAL_TABLET | Freq: Three times a day (TID) | ORAL | Status: DC | PRN

## 2014-10-12 MED ORDER — SODIUM CHLORIDE 0.9 % IV BOLUS (SEPSIS): 250.0000 mL | Freq: Once | INTRAVENOUS | Status: DC

## 2014-10-12 MED ORDER — ONDANSETRON HCL 4 MG/2ML IJ SOLN
4.0000 mg | Freq: Once | INTRAMUSCULAR | Status: AC
  Administered 2014-10-12: 4 mg via INTRAVENOUS
  Filled 2014-10-12: qty 2

## 2014-10-12 MED ORDER — NAPROXEN 500 MG PO TABS: 500.0000 mg | ORAL_TABLET | Freq: Two times a day (BID) | ORAL | Status: DC

## 2014-10-12 MED ORDER — HYDROCODONE-ACETAMINOPHEN 5-325 MG PO TABS: 1.0000 | ORAL_TABLET | Freq: Four times a day (QID) | ORAL | Status: DC | PRN

## 2014-10-12 MED ORDER — SODIUM CHLORIDE 0.9 % IV SOLN: INTRAVENOUS | Status: DC

## 2014-10-12 MED ORDER — SODIUM CHLORIDE 0.9 % IV BOLUS (SEPSIS)
500.0000 mL | Freq: Once | INTRAVENOUS | Status: AC
  Administered 2014-10-12: 500 mL via INTRAVENOUS

## 2014-10-12 NOTE — ED Provider Notes (Signed)
CSN: [id number]     Arrival date & time 10/12/14  0801 History   First MD Initiated Contact with Patient 10/12/14 0831     Chief Complaint  Patient presents with  . Flank Pain     (Consider location/radiation/quality/duration/timing/severity/associated sxs/prior Treatment) Patient is a 28 y.o. female presenting with flank pain. The history is provided by the patient.  Flank Pain Associated symptoms include abdominal pain. Pertinent negatives include no chest pain, no headaches and no shortness of breath.   patient with onset of right CVA right flank pain radiating to right lower quadrant yesterday. It is waxed and waned but has not resolved. Pain is 7 out of 10 currently. Associated with some difficulty urinating. No fevers. Last menstrual period one week ago. Pain is sharp at times and when it's very sharp associated with nausea. Other times it's dull. Not made worse or better by anything. Reminds patient of her past history of kidney stones.  Past Medical History  Diagnosis Date  . Recent childbirth 7 1/2 months ago  . Depression   . Anxiety   . Psoriatic arthritis   . Arthritis   . Kidney stones    Past Surgical History  Procedure Laterality Date  . Tonsillectomy  2004  . Cholecystectomy  11/12/2011    Procedure: LAPAROSCOPIC CHOLECYSTECTOMY;  Surgeon: Atilano Ina, MD;  Location: [facility];  Service: General;  Laterality: N/A;   Family History  Problem Relation Age of Onset  . Cancer Paternal Grandfather     bladder   History  Substance Use Topics  . Smoking status: Former Games developer  . Smokeless tobacco: Never Used  . Alcohol Use: No   OB History   Grav Para Term Preterm Abortions TAB SAB Ect Mult Living   2 1 1  0 1 0 1 0 0 1     Review of Systems  Constitutional: Negative for fever.  HENT: Negative for congestion.   Eyes: Negative for redness.  Respiratory: Negative for shortness of breath.   Cardiovascular: Negative for chest pain.  Gastrointestinal: Positive for  nausea and abdominal pain.  Genitourinary: Positive for flank pain. Negative for dysuria, vaginal bleeding and vaginal discharge.  Musculoskeletal: Positive for back pain.  Skin: Negative for rash.  Neurological: Negative for headaches.  Hematological: Does not bruise/bleed easily.  Psychiatric/Behavioral: Negative for confusion.      Allergies  Shrimp  Home Medications   Prior to Admission medications   Medication Sig Start Date End Date Taking? Authorizing Provider  InFLIXimab (REMICADE IV) Inject into the vein every 5 (five) weeks.     Historical Provider, MD  Norgestimate-Ethinyl Estradiol Triphasic (ORTHO TRI-CYCLEN, 28,) 0.18/0.215/0.25 MG-35 MCG tablet Take 1 tablet by mouth daily.    Historical Provider, MD  sertraline (ZOLOFT) 25 MG tablet Take 25 mg by mouth daily.    Historical Provider, MD  traMADol (ULTRAM) 50 MG tablet Take 100 mg by mouth every 4 (four) hours as needed for moderate pain.    Historical Provider, MD   BP 144/84  Pulse 98  Temp(Src) 98.2 F (36.8 C) (Oral)  Resp 22  Ht 5\' 8"  (1.727 m)  Wt 160 lb (72.576 kg)  BMI 24.33 kg/m2  SpO2 100%  LMP 09/28/2014 Physical Exam  Nursing note and vitals reviewed. Constitutional: She is oriented to person, place, and time. She appears well-developed and well-nourished. No distress.  HENT:  Head: Normocephalic and atraumatic.  Mouth/Throat: Oropharynx is clear and moist.  Eyes: Conjunctivae and EOM are normal. Pupils are  equal, round, and reactive to light.  Neck: Normal range of motion.  Cardiovascular: Normal rate, regular rhythm and normal heart sounds.   No murmur heard. Pulmonary/Chest: Effort normal and breath sounds normal. No respiratory distress.  Abdominal: Soft. Bowel sounds are normal. There is no tenderness.  Musculoskeletal: Normal range of motion.  Neurological: She is alert and oriented to person, place, and time. No cranial nerve deficit. She exhibits normal muscle tone. Coordination normal.   Skin: Skin is warm. No rash noted.    ED Course  Procedures (including critical care time) Labs Review Labs Reviewed  URINALYSIS, ROUTINE W REFLEX MICROSCOPIC - Abnormal; Notable for the following:    APPearance CLOUDY (*)    Hgb urine dipstick TRACE (*)    All other components within normal limits  URINE MICROSCOPIC-ADD ON - Abnormal; Notable for the following:    Squamous Epithelial / LPF MANY (*)    Bacteria, UA FEW (*)    All other components within normal limits  PREGNANCY, URINE  CBC WITH DIFFERENTIAL  BASIC METABOLIC PANEL   Results for orders placed during the hospital encounter of 10/12/14  PREGNANCY, URINE      Result Value Ref Range   Preg Test, Ur NEGATIVE  NEGATIVE  URINALYSIS, ROUTINE W REFLEX MICROSCOPIC      Result Value Ref Range   Color, Urine YELLOW  YELLOW   APPearance CLOUDY (*) CLEAR   Specific Gravity, Urine 1.008  1.005 - 1.030   pH 6.5  5.0 - 8.0   Glucose, UA NEGATIVE  NEGATIVE mg/dL   Hgb urine dipstick TRACE (*) NEGATIVE   Bilirubin Urine NEGATIVE  NEGATIVE   Ketones, ur NEGATIVE  NEGATIVE mg/dL   Protein, ur NEGATIVE  NEGATIVE mg/dL   Urobilinogen, UA 0.2  0.0 - 1.0 mg/dL   Nitrite NEGATIVE  NEGATIVE   Leukocytes, UA NEGATIVE  NEGATIVE  CBC WITH DIFFERENTIAL      Result Value Ref Range   WBC 9.1  4.0 - 10.5 K/uL   RBC 4.21  3.87 - 5.11 MIL/uL   Hemoglobin 13.3  12.0 - 15.0 g/dL   HCT 16.140.2  09.636.0 - 04.546.0 %   MCV 95.5  78.0 - 100.0 fL   MCH 31.6  26.0 - 34.0 pg   MCHC 33.1  30.0 - 36.0 g/dL   RDW 40.912.1  81.111.5 - 91.415.5 %   Platelets 200  150 - 400 K/uL   Neutrophils Relative % 58  43 - 77 %   Neutro Abs 5.3  1.7 - 7.7 K/uL   Lymphocytes Relative 31  12 - 46 %   Lymphs Abs 2.8  0.7 - 4.0 K/uL   Monocytes Relative 9  3 - 12 %   Monocytes Absolute 0.8  0.1 - 1.0 K/uL   Eosinophils Relative 2  0 - 5 %   Eosinophils Absolute 0.2  0.0 - 0.7 K/uL   Basophils Relative 0  0 - 1 %   Basophils Absolute 0.0  0.0 - 0.1 K/uL  BASIC METABOLIC PANEL       Result Value Ref Range   Sodium 138  137 - 147 mEq/L   Potassium 3.9  3.7 - 5.3 mEq/L   Chloride 100  96 - 112 mEq/L   CO2 26  19 - 32 mEq/L   Glucose, Bld 83  70 - 99 mg/dL   BUN 11  6 - 23 mg/dL   Creatinine, Ser 7.820.80  0.50 - 1.10 mg/dL   Calcium 9.2  8.4 - 10.5 mg/dL   GFR calc non Af Amer >90  >90 mL/min   GFR calc Af Amer >90  >90 mL/min   Anion gap 12  5 - 15  URINE MICROSCOPIC-ADD ON      Result Value Ref Range   Squamous Epithelial / LPF MANY (*) RARE   WBC, UA 0-2  <3 WBC/hpf   RBC / HPF 3-6  <3 RBC/hpf   Bacteria, UA FEW (*) RARE     Imaging Review Ct Abdomen Pelvis Wo Contrast  10/12/2014   CLINICAL DATA:  Right flank pain since yesterday. History of kidney stones. Nausea.  EXAM: CT ABDOMEN AND PELVIS WITHOUT CONTRAST  TECHNIQUE: Multidetector CT imaging of the abdomen and pelvis was performed following the standard protocol without IV contrast.  COMPARISON:  CT scans dated 05/21/2014 and 02/23/2012  FINDINGS: There is a 4 mm nodule in the lower lobe of the left lung on image 7 of series 4, stable since 02/23/2012.  Gallbladder has been removed. Biliary tree is otherwise normal. Liver, spleen, pancreas, adrenal glands, and kidneys are normal. There is no adenopathy or mass lesion. No renal or ureteral or bladder calculi. Appendix and terminal ileum are normal. Uterus and left ovary appear normal. The right ovary is obscured by adjacent bowel. No osseous abnormalities. No free air or free fluid.  IMPRESSION: Benign appearing abdomen and pelvis. Stable 4 mm nodule in the left lower lobe. No follow-up of this nodule is recommended given the small size and the patient's age.   Electronically Signed   By: Geanie CooleyJim  Maxwell M.D.   On: 10/12/2014 09:04     EKG Interpretation None      MDM   Final diagnoses:  Flank pain    Urinalysis not consistent with urinary tract infection. Pregnancy test negative. CT scan negative for ureteral stone. Appendix also was normal. Right ovary is  obscured so ovarian cyst is a possibility. Patient's last measured. Though was just 1 week ago. Symptoms not consistent with a pelvic infection. As stated no evidence of appendicitis no evidence of ureteral stone.  Labs without any significant abnormalities. Patient will be discharged home with antinausea medicine and anti-inflammatory medicine and pain medicine. Patient does work at home but work note provided if needed.  Vanetta MuldersScott Thaddeaus Monica, MD 10/12/14 [phone]

## 2014-10-12 NOTE — ED Notes (Signed)
Pt c/o right flank pain radiating around to abdomen since yesterday.  Hx of kidney stones.  Pt states some nausea.

## 2014-10-12 NOTE — Discharge Instructions (Signed)
CT scan of the abdomen negative for appendicitis negative for any kidney stone problems. As we discussed right ovary not visualized. Ovarian cyst possibility but unusual timing for that. Take medications as directed. Naprosyn for anti-inflammatory take that on a regular basis. Supplement with the hydrocodone and antinausea medicine as needed. Follow-up with your doctor if symptoms do not resolve. Return for any new or worse symptoms.

## 2014-10-18 ENCOUNTER — Encounter (HOSPITAL_BASED_OUTPATIENT_CLINIC_OR_DEPARTMENT_OTHER): Payer: Self-pay | Admitting: Emergency Medicine

## 2014-12-17 NOTE — L&D Delivery Note (Signed)
Patient was C/C/+4 and pushed for 5 minutes with epidural.   NSVD  female infant, Apgars 9,9, weight P.   The patient had no lacerations. Fundus was firm. EBL was expected amount- 50 cc Placenta was delivered intact. Vagina was clear.  Baby was vigorous and doing skin to skin with mother.  HORVATH,MICHELLE A

## 2015-01-31 ENCOUNTER — Inpatient Hospital Stay (HOSPITAL_COMMUNITY)
Admission: AD | Admit: 2015-01-31 | Discharge: 2015-01-31 | Disposition: A | Discharge status: Home / Self Care | Payer: 59 | Source: Ambulatory Visit | Attending: Obstetrics & Gynecology | Admitting: Obstetrics & Gynecology

## 2015-01-31 ENCOUNTER — Inpatient Hospital Stay (HOSPITAL_COMMUNITY): Admit: 2015-01-31 | Payer: 59

## 2015-01-31 ENCOUNTER — Encounter (HOSPITAL_COMMUNITY): Payer: Self-pay | Admitting: *Deleted

## 2015-01-31 ENCOUNTER — Inpatient Hospital Stay (HOSPITAL_COMMUNITY)
Admit: 2015-01-31 | Discharge: 2015-01-31 | Disposition: A | Discharge status: Home / Self Care | Payer: 59 | Attending: Obstetrics and Gynecology | Admitting: Obstetrics and Gynecology

## 2015-01-31 ENCOUNTER — Inpatient Hospital Stay (HOSPITAL_COMMUNITY): Payer: 59

## 2015-01-31 DIAGNOSIS — R109 Unspecified abdominal pain: Secondary | ICD-10-CM

## 2015-01-31 DIAGNOSIS — Z87891 Personal history of nicotine dependence: Secondary | ICD-10-CM | POA: Diagnosis not present

## 2015-01-31 DIAGNOSIS — O3481 Maternal care for other abnormalities of pelvic organs, first trimester: Secondary | ICD-10-CM | POA: Insufficient documentation

## 2015-01-31 DIAGNOSIS — Z3A01 Less than 8 weeks gestation of pregnancy: Secondary | ICD-10-CM | POA: Diagnosis not present

## 2015-01-31 DIAGNOSIS — R1031 Right lower quadrant pain: Secondary | ICD-10-CM

## 2015-01-31 DIAGNOSIS — N831 Corpus luteum cyst of ovary, unspecified side: Secondary | ICD-10-CM

## 2015-01-31 DIAGNOSIS — M549 Dorsalgia, unspecified: Secondary | ICD-10-CM | POA: Diagnosis present

## 2015-01-31 LAB — COMPREHENSIVE METABOLIC PANEL
ALBUMIN: 4.4 g/dL (ref 3.5–5.2)
ALK PHOS: 40 U/L (ref 39–117)
ALT: 35 U/L (ref 0–35)
AST: 30 U/L (ref 0–37)
Anion gap: 8 (ref 5–15)
BUN: 11 mg/dL (ref 6–23)
CALCIUM: 9.2 mg/dL (ref 8.4–10.5)
CO2: 20 mmol/L (ref 19–32)
Chloride: 109 mmol/L (ref 96–112)
Creatinine, Ser: 0.66 mg/dL (ref 0.50–1.10)
Glucose, Bld: 85 mg/dL (ref 70–99)
Potassium: 3.9 mmol/L (ref 3.5–5.1)
SODIUM: 137 mmol/L (ref 135–145)
Total Bilirubin: 0.8 mg/dL (ref 0.3–1.2)
Total Protein: 7.1 g/dL (ref 6.0–8.3)

## 2015-01-31 LAB — URINALYSIS, ROUTINE W REFLEX MICROSCOPIC
BILIRUBIN URINE: NEGATIVE
Bilirubin Urine: NEGATIVE
Glucose, UA: NEGATIVE mg/dL
Glucose, UA: NEGATIVE mg/dL
HGB URINE DIPSTICK: NEGATIVE
HGB URINE DIPSTICK: NEGATIVE
KETONES UR: NEGATIVE mg/dL
KETONES UR: NEGATIVE mg/dL
LEUKOCYTES UA: NEGATIVE
Nitrite: NEGATIVE
Nitrite: NEGATIVE
PH: 6 (ref 5.0–8.0)
PROTEIN: NEGATIVE mg/dL
Protein, ur: NEGATIVE mg/dL
Specific Gravity, Urine: 1.02 (ref 1.005–1.030)
Specific Gravity, Urine: 1.02 (ref 1.005–1.030)
UROBILINOGEN UA: 0.2 mg/dL (ref 0.0–1.0)
UROBILINOGEN UA: 0.2 mg/dL (ref 0.0–1.0)
pH: 6 (ref 5.0–8.0)

## 2015-01-31 LAB — CBC WITH DIFFERENTIAL/PLATELET
BASOS ABS: 0 10*3/uL (ref 0.0–0.1)
Basophils Relative: 0 % (ref 0–1)
EOS PCT: 1 % (ref 0–5)
Eosinophils Absolute: 0.2 10*3/uL (ref 0.0–0.7)
HCT: 41.8 % (ref 36.0–46.0)
HEMOGLOBIN: 14.1 g/dL (ref 12.0–15.0)
Lymphocytes Relative: 8 % — ABNORMAL LOW (ref 12–46)
Lymphs Abs: 1 10*3/uL (ref 0.7–4.0)
MCH: 31.7 pg (ref 26.0–34.0)
MCHC: 33.7 g/dL (ref 30.0–36.0)
MCV: 93.9 fL (ref 78.0–100.0)
MONOS PCT: 5 % (ref 3–12)
Monocytes Absolute: 0.7 10*3/uL (ref 0.1–1.0)
NEUTROS ABS: 11.6 10*3/uL — AB (ref 1.7–7.7)
NEUTROS PCT: 86 % — AB (ref 43–77)
Platelets: 201 10*3/uL (ref 150–400)
RBC: 4.45 MIL/uL (ref 3.87–5.11)
RDW: 12.3 % (ref 11.5–15.5)
WBC: 13.5 10*3/uL — ABNORMAL HIGH (ref 4.0–10.5)

## 2015-01-31 LAB — URINE MICROSCOPIC-ADD ON

## 2015-01-31 LAB — HCG, QUANTITATIVE, PREGNANCY: hCG, Beta Chain, Quant, S: 27943 m[IU]/mL — ABNORMAL HIGH (ref <5)

## 2015-01-31 LAB — POCT PREGNANCY, URINE: PREG TEST UR: POSITIVE — AB

## 2015-01-31 MED ORDER — PROMETHAZINE HCL 25 MG PO TABS: 25.0000 mg | ORAL_TABLET | Freq: Four times a day (QID) | ORAL | Status: DC | PRN

## 2015-01-31 MED ORDER — OXYCODONE-ACETAMINOPHEN 5-325 MG PO TABS: 1.0000 | ORAL_TABLET | ORAL | Status: DC | PRN

## 2015-01-31 MED ORDER — LACTATED RINGERS IV BOLUS (SEPSIS)
1000.0000 mL | Freq: Once | INTRAVENOUS | Status: AC
  Administered 2015-01-31: 1000 mL via INTRAVENOUS

## 2015-01-31 MED ORDER — PROMETHAZINE HCL 25 MG/ML IJ SOLN
12.5000 mg | Freq: Once | INTRAMUSCULAR | Status: AC
  Administered 2015-01-31: 12.5 mg via INTRAVENOUS
  Filled 2015-01-31: qty 1

## 2015-01-31 MED ORDER — ONDANSETRON HCL 4 MG/2ML IJ SOLN
4.0000 mg | Freq: Once | INTRAMUSCULAR | Status: AC
  Administered 2015-01-31: 4 mg via INTRAVENOUS
  Filled 2015-01-31: qty 2

## 2015-01-31 MED ORDER — HYDROMORPHONE HCL 1 MG/ML IJ SOLN
1.0000 mg | Freq: Once | INTRAMUSCULAR | Status: AC
  Administered 2015-01-31: 1 mg via INTRAVENOUS
  Filled 2015-01-31: qty 1

## 2015-01-31 MED ORDER — OXYCODONE-ACETAMINOPHEN 5-325 MG PO TABS
2.0000 | ORAL_TABLET | Freq: Once | ORAL | Status: AC
  Administered 2015-01-31: 2 via ORAL
  Filled 2015-01-31: qty 2

## 2015-01-31 NOTE — MAU Note (Signed)
Woke up @ 0300 with R flank pain, began vomiting.  Also sharp lower abd pain, dizzy.  Pos HPT 3-4 weeks ago.

## 2015-01-31 NOTE — MAU Provider Note (Signed)
History     CSN: [id number]  Arrival date and time: 01/31/15 4098   First Provider Initiated Contact with Patient 01/31/15 [phone]      Chief Complaint  Patient presents with  . Flank Pain  . Emesis  . Dizziness   HPI    Ms. Brenda Webb is a 29 y.o. female who presents with acute onset of right sided back pain that radiates to her lower right side of her stomach. The pain came on all of a sudden at 0300; woke her up out of her sleep. She has had a kidney stone in the past and this feels similar, although she states she has never had pain like this before.   She has vomited at least 7 times this morning.  She feels clammy, not sure if she feels feverish.   OB History    Gravida Para Term Preterm AB TAB SAB Ectopic Multiple Living   0 1 0 1 0 0 1      Past Medical History  Diagnosis Date  . Recent childbirth 7 1/2 months ago  . Depression   . Anxiety   . Psoriatic arthritis   . Arthritis   . Kidney stones   . Pregnancy induced hypertension     Past Surgical History  Procedure Laterality Date  . Tonsillectomy  2004  . Cholecystectomy  11/12/2011    Procedure: LAPAROSCOPIC CHOLECYSTECTOMY;  Surgeon: Atilano Ina, MD;  Location: [facility];  Service: General;  Laterality: N/A;    Family History  Problem Relation Age of Onset  . Cancer Paternal Grandfather     bladder    History  Substance Use Topics  . Smoking status: Former Games developer  . Smokeless tobacco: Never Used  . Alcohol Use: No    Allergies:  Allergies  Allergen Reactions  . Shrimp [Shellfish Allergy] Swelling    Prescriptions prior to admission  Medication Sig Dispense Refill Last Dose  . HYDROcodone-acetaminophen (NORCO/VICODIN) 5-325 MG per tablet Take 1-2 tablets by mouth every 6 (six) hours as needed for moderate pain. 10 tablet 0   . InFLIXimab (REMICADE IV) Inject into the vein every 5 (five) weeks.    Next treatment is October 9th  . naproxen (NAPROSYN) 500 MG tablet Take 1 tablet (500  mg total) by mouth 2 (two) times daily. 14 tablet 0   . Norgestimate-Ethinyl Estradiol Triphasic (ORTHO TRI-CYCLEN, 28,) 0.18/0.215/0.25 MG-35 MCG tablet Take 1 tablet by mouth daily.   09/17/2014 at Unknown time  . ondansetron (ZOFRAN ODT) 4 MG disintegrating tablet Take 1 tablet (4 mg total) by mouth every 8 (eight) hours as needed for nausea or vomiting. 10 tablet 1   . sertraline (ZOLOFT) 25 MG tablet Take 25 mg by mouth daily.   09/17/2014 at Unknown time  . traMADol (ULTRAM) 50 MG tablet Take 100 mg by mouth every 4 (four) hours as needed for moderate pain.   09/17/2014 at Unknown time   Results for orders placed or performed during the hospital encounter of 01/31/15 (from the past 48 hour(s))  Urinalysis, Routine w reflex microscopic     Status: Abnormal   Collection Time: 01/31/15  9:00 AM  Result Value Ref Range   Color, Urine YELLOW YELLOW   APPearance CLEAR CLEAR   Specific Gravity, Urine 1.020 1.005 - 1.030   pH 6.0 5.0 - 8.0   Glucose, UA NEGATIVE NEGATIVE mg/dL   Hgb urine dipstick NEGATIVE NEGATIVE   Bilirubin Urine NEGATIVE NEGATIVE   Ketones,  ur NEGATIVE NEGATIVE mg/dL   Protein, ur NEGATIVE NEGATIVE mg/dL   Urobilinogen, UA 0.2 0.0 - 1.0 mg/dL   Nitrite NEGATIVE NEGATIVE   Leukocytes, UA MODERATE (A) NEGATIVE  Urine microscopic-add on     Status: Abnormal   Collection Time: 01/31/15  9:00 AM  Result Value Ref Range   Squamous Epithelial / LPF MANY (A) RARE   WBC, UA 7-10 <3 WBC/hpf   RBC / HPF 3-6 <3 RBC/hpf   Bacteria, UA FEW (A) RARE   Urine-Other MUCOUS PRESENT   Pregnancy, urine POC     Status: Abnormal   Collection Time: 01/31/15  9:06 AM  Result Value Ref Range   Preg Test, Ur POSITIVE (A) NEGATIVE    Comment:        THE SENSITIVITY OF THIS METHODOLOGY IS >24 mIU/mL   CBC with Differential     Status: Abnormal   Collection Time: 01/31/15  9:30 AM  Result Value Ref Range   WBC 13.5 (H) 4.0 - 10.5 K/uL   RBC 4.45 3.87 - 5.11 MIL/uL   Hemoglobin 14.1 12.0  - 15.0 g/dL   HCT 16.1 09.6 - 04.5 %   MCV 93.9 78.0 - 100.0 fL   MCH 31.7 26.0 - 34.0 pg   MCHC 33.7 30.0 - 36.0 g/dL   RDW 40.9 81.1 - 91.4 %   Platelets 201 150 - 400 K/uL   Neutrophils Relative % 86 (H) 43 - 77 %   Neutro Abs 11.6 (H) 1.7 - 7.7 K/uL   Lymphocytes Relative 8 (L) 12 - 46 %   Lymphs Abs 1.0 0.7 - 4.0 K/uL   Monocytes Relative 5 3 - 12 %   Monocytes Absolute 0.7 0.1 - 1.0 K/uL   Eosinophils Relative 1 0 - 5 %   Eosinophils Absolute 0.2 0.0 - 0.7 K/uL   Basophils Relative 0 0 - 1 %   Basophils Absolute 0.0 0.0 - 0.1 K/uL   Mr Pelvis Wo Contrast  01/31/2015   CLINICAL DATA:  Abdominal pain, cramping and vomiting today. Six weeks pregnant. Evaluate for appendicitis. History of cholecystectomy in 2012. Initial encounter.  EXAM: MRI ABDOMEN AND PELVIS WITHOUT CONTRAST  TECHNIQUE: Multiplanar multisequence MR imaging of the abdomen and pelvis was performed. No intravenous contrast was administered.  COMPARISON:  Ob ultrasound 01/31/2015.  FINDINGS: MRI ABDOMEN FINDINGS  The visualized liver, spleen, pancreas and biliary system appear unremarkable. There is no biliary dilatation or evidence of choledocholithiasis status post cholecystectomy. The adrenal glands and kidneys appear normal. There is no hydronephrosis.  No inflammatory changes are identified. The cecum and terminal ileum appear normal. Retrocecal appendix is normal in caliber without surrounding inflammatory change. No vascular abnormalities or enlarged lymph nodes demonstrated.  MRI PELVIS FINDINGS  Small intrauterine gestational sac and right ovarian corpus luteal cyst are noted. There is no evidence of pelvic inflammatory process or fluid collection. No enlarged pelvic lymph nodes demonstrated.  IMPRESSION: 1. No evidence of acute appendicitis. 2. No acute abdominal pelvic findings demonstrated status post cholecystectomy.   Electronically Signed   By: Carey Bullocks M.D.   On: 01/31/2015 13:32   Mr Abdomen Wo  Contrast  01/31/2015   CLINICAL DATA:  Abdominal pain, cramping and vomiting today. Six weeks pregnant. Evaluate for appendicitis. History of cholecystectomy in 2012. Initial encounter.  EXAM: MRI ABDOMEN AND PELVIS WITHOUT CONTRAST  TECHNIQUE: Multiplanar multisequence MR imaging of the abdomen and pelvis was performed. No intravenous contrast was administered.  COMPARISON:  Ob ultrasound  01/31/2015.  FINDINGS: MRI ABDOMEN FINDINGS  The visualized liver, spleen, pancreas and biliary system appear unremarkable. There is no biliary dilatation or evidence of choledocholithiasis status post cholecystectomy. The adrenal glands and kidneys appear normal. There is no hydronephrosis.  No inflammatory changes are identified. The cecum and terminal ileum appear normal. Retrocecal appendix is normal in caliber without surrounding inflammatory change. No vascular abnormalities or enlarged lymph nodes demonstrated.  MRI PELVIS FINDINGS  Small intrauterine gestational sac and right ovarian corpus luteal cyst are noted. There is no evidence of pelvic inflammatory process or fluid collection. No enlarged pelvic lymph nodes demonstrated.  IMPRESSION: 1. No evidence of acute appendicitis. 2. No acute abdominal pelvic findings demonstrated status post cholecystectomy.   Electronically Signed   By: Carey BullocksWilliam  Veazey M.D.   On: 01/31/2015 13:32   [place] Ob Comp Less 14 Wks  01/31/2015   CLINICAL DATA:  Right-sided abdominal pain in first trimester pregnancy.  EXAM: OBSTETRIC <14 WK US AND TRANSVAGINAL OB US  TECHNIQUE: Both transabdominal and transvaginal ultrasound examinations were performed for complete evaluation of the gestation as well as the maternal uterus, adnexal regions, and pelvic cul-de-sac. Transvaginal technique was performed to assess early pregnancy.  COMPARISON:  None.  FINDINGS: Intrauterine gestational sac: Visualized/normal in shape. No definitive sub chronic hemorrhage.  Yolk sac:  Present  Embryo:  Present  Cardiac  Activity: Present  Heart Rate: 94 bpm, of indeterminate significance based on early gestational age.  CRL:  4  mm   6 w   1 d                  US EDC: 09/25/2015  Maternal uterus/adnexae: Asymmetric enlargement of the right ovary secondary to a 2.5 cm corpus luteum cyst. Color Doppler flow is noted within the cyst wall. No free pelvic fluid.  IMPRESSION: 1. Single living intrauterine gestation with estimated age 90 weeks 1 day. 2. Corpus luteum cyst in the right ovary.   Electronically Signed   By: Marnee SpringJonathon  Watts M.D.   On: 01/31/2015 11:33   [place] Ob Transvaginal  01/31/2015   CLINICAL DATA:  Right-sided abdominal pain in first trimester pregnancy.  EXAM: OBSTETRIC <14 WK US AND TRANSVAGINAL OB US  TECHNIQUE: Both transabdominal and transvaginal ultrasound examinations were performed for complete evaluation of the gestation as well as the maternal uterus, adnexal regions, and pelvic cul-de-sac. Transvaginal technique was performed to assess early pregnancy.  COMPARISON:  None.  FINDINGS: Intrauterine gestational sac: Visualized/normal in shape. No definitive sub chronic hemorrhage.  Yolk sac:  Present  Embryo:  Present  Cardiac Activity: Present  Heart Rate: 94 bpm, of indeterminate significance based on early gestational age.  CRL:  4  mm   6 w   1 d                  US EDC: 09/25/2015  Maternal uterus/adnexae: Asymmetric enlargement of the right ovary secondary to a 2.5 cm corpus luteum cyst. Color Doppler flow is noted within the cyst wall. No free pelvic fluid.  IMPRESSION: 1. Single living intrauterine gestation with estimated age 90 weeks 1 day. 2. Corpus luteum cyst in the right ovary.   Electronically Signed   By: Marnee SpringJonathon  Watts M.D.   On: 01/31/2015 11:33     Review of Systems  Constitutional: Positive for chills. Negative for fever.  Gastrointestinal: Positive for nausea, vomiting and abdominal pain (Right, lower abdominal pain ).  Genitourinary: Positive for flank pain. Negative for dysuria,  urgency, frequency and hematuria.       Denies vaginal bleeding    Physical Exam   Blood pressure 119/70, pulse 99, temperature 99 F (37.2 C), temperature source Oral, resp. rate 20, height  (1.727 m), weight 77.656 kg (171 lb 3.2 oz), last menstrual period 12/21/2014.  Physical Exam  Constitutional: She is oriented to person, place, and time. She appears well-developed and well-nourished. She appears distressed.  Patient is rocking back and fourth in the bed.  Neck: Neck supple.  Cardiovascular: Normal rate and normal heart sounds.   Respiratory: Effort normal and breath sounds normal. No respiratory distress.  GI: Soft. There is tenderness in the right lower quadrant and suprapubic area. There is rigidity, guarding and CVA tenderness. There is no rebound.  Musculoskeletal: Normal range of motion.  Neurological: She is alert and oriented to person, place, and time.  Skin: Skin is warm. She is not diaphoretic.  Psychiatric: Her behavior is normal.    MAU Course  Procedures  None  MDM  Cath UA CBC Dilaudid 1 mg IV Phenergan 12.5 IV LR bolus Discussed patient with Dr. Mora Appl [place] to evaluate IUP> MRI due to N/V, lower grade ever, elevated WBC and right lower quadrant pain.  Prior to MRI patient received an additional 1 mg of dilaudid, and 12.5 mg of phenergan  1430: patient returned from MRI  Discussed MRI and [place] results with Dr. Mora Appl [place] and MRI results discussed with patient and her significant other.  Patient given 2 percocet po and Zofran 4 mg IV (risk vs benefit discussed) prior to discharge home. Patient rates her pain 0/10 at the time of discharge.    Assessment and Plan   A: Abdominal pain in pregnancy SIUP @ [redacted]w[redacted]d with cardiac activity.  Right lower quadrant pain; normal MRI  2.5 cm right ovarian corpus luteal cyst  P: Discharge home in stable condition RX: Percocet, phenergan Follow up with Dr. Mora Appl this week Return to MAU if pain worens  Iona Hansen  Salvador Bigbee, NP 01/31/2015 7:45 PM

## 2015-01-31 NOTE — MAU Note (Signed)
[facility] contacted regarding transport to [facility].

## 2015-01-31 NOTE — MAU Note (Signed)
[facility]

## 2015-02-07 ENCOUNTER — Other Ambulatory Visit: Payer: Self-pay | Admitting: Obstetrics and Gynecology

## 2015-02-07 LAB — OB RESULTS CONSOLE GC/CHLAMYDIA
CHLAMYDIA, DNA PROBE: NEGATIVE
Gonorrhea: NEGATIVE

## 2015-02-08 LAB — CYTOLOGY - PAP

## 2015-03-09 LAB — OB RESULTS CONSOLE HEPATITIS B SURFACE ANTIGEN: Hepatitis B Surface Ag: NEGATIVE

## 2015-03-09 LAB — OB RESULTS CONSOLE ABO/RH: RH Type: NEGATIVE

## 2015-03-09 LAB — OB RESULTS CONSOLE HIV ANTIBODY (ROUTINE TESTING): HIV: NONREACTIVE

## 2015-03-09 LAB — OB RESULTS CONSOLE RPR: RPR: NONREACTIVE

## 2015-03-09 LAB — OB RESULTS CONSOLE RUBELLA ANTIBODY, IGM: Rubella: IMMUNE

## 2015-03-09 LAB — OB RESULTS CONSOLE ANTIBODY SCREEN: Antibody Screen: NEGATIVE

## 2015-04-17 ENCOUNTER — Inpatient Hospital Stay (HOSPITAL_COMMUNITY)
Admission: AD | Admit: 2015-04-17 | Discharge: 2015-04-17 | Disposition: A | Discharge status: Home / Self Care | Payer: 59 | Source: Ambulatory Visit | Attending: Obstetrics | Admitting: Obstetrics

## 2015-04-17 ENCOUNTER — Encounter (HOSPITAL_COMMUNITY): Payer: Self-pay | Admitting: *Deleted

## 2015-04-17 ENCOUNTER — Inpatient Hospital Stay (HOSPITAL_COMMUNITY): Payer: 59

## 2015-04-17 DIAGNOSIS — R109 Unspecified abdominal pain: Secondary | ICD-10-CM

## 2015-04-17 DIAGNOSIS — N200 Calculus of kidney: Secondary | ICD-10-CM | POA: Diagnosis not present

## 2015-04-17 DIAGNOSIS — O9989 Other specified diseases and conditions complicating pregnancy, childbirth and the puerperium: Secondary | ICD-10-CM | POA: Diagnosis not present

## 2015-04-17 DIAGNOSIS — Z3A17 17 weeks gestation of pregnancy: Secondary | ICD-10-CM | POA: Insufficient documentation

## 2015-04-17 DIAGNOSIS — Z87442 Personal history of urinary calculi: Secondary | ICD-10-CM | POA: Insufficient documentation

## 2015-04-17 DIAGNOSIS — Z87891 Personal history of nicotine dependence: Secondary | ICD-10-CM | POA: Insufficient documentation

## 2015-04-17 LAB — URINALYSIS, ROUTINE W REFLEX MICROSCOPIC
BILIRUBIN URINE: NEGATIVE
GLUCOSE, UA: NEGATIVE mg/dL
KETONES UR: NEGATIVE mg/dL
Nitrite: NEGATIVE
PROTEIN: NEGATIVE mg/dL
Specific Gravity, Urine: >1.03 — ABNORMAL HIGH (ref 1.005–1.030)
Urobilinogen, UA: 0.2 mg/dL (ref 0.0–1.0)
pH: 6 (ref 5.0–8.0)

## 2015-04-17 LAB — CBC WITH DIFFERENTIAL/PLATELET
BASOS ABS: 0 10*3/uL (ref 0.0–0.1)
Basophils Relative: 0 % (ref 0–1)
EOS ABS: 0.2 10*3/uL (ref 0.0–0.7)
Eosinophils Relative: 1 % (ref 0–5)
HCT: 36.3 % (ref 36.0–46.0)
Hemoglobin: 12.4 g/dL (ref 12.0–15.0)
LYMPHS ABS: 3 10*3/uL (ref 0.7–4.0)
Lymphocytes Relative: 23 % (ref 12–46)
MCH: 31.6 pg (ref 26.0–34.0)
MCHC: 34.2 g/dL (ref 30.0–36.0)
MCV: 92.4 fL (ref 78.0–100.0)
Monocytes Absolute: 0.8 10*3/uL (ref 0.1–1.0)
Monocytes Relative: 7 % (ref 3–12)
NEUTROS PCT: 69 % (ref 43–77)
Neutro Abs: 8.9 10*3/uL — ABNORMAL HIGH (ref 1.7–7.7)
PLATELETS: 199 10*3/uL (ref 150–400)
RBC: 3.93 MIL/uL (ref 3.87–5.11)
RDW: 12.7 % (ref 11.5–15.5)
WBC: 12.9 10*3/uL — ABNORMAL HIGH (ref 4.0–10.5)

## 2015-04-17 LAB — URINE MICROSCOPIC-ADD ON

## 2015-04-17 MED ORDER — OXYCODONE-ACETAMINOPHEN 5-325 MG PO TABS
1.0000 | ORAL_TABLET | Freq: Once | ORAL | Status: AC
  Administered 2015-04-17: 1 via ORAL
  Filled 2015-04-17: qty 1

## 2015-04-17 MED ORDER — ONDANSETRON 8 MG PO TBDP: 8.0000 mg | ORAL_TABLET | Freq: Three times a day (TID) | ORAL | Status: DC | PRN

## 2015-04-17 MED ORDER — LACTATED RINGERS IV SOLN
INTRAVENOUS | Status: DC
  Administered 2015-04-17: 16:00:00 via INTRAVENOUS

## 2015-04-17 MED ORDER — PROMETHAZINE HCL 25 MG/ML IJ SOLN
25.0000 mg | Freq: Once | INTRAMUSCULAR | Status: AC
  Administered 2015-04-17: 25 mg via INTRAVENOUS
  Filled 2015-04-17: qty 1

## 2015-04-17 MED ORDER — PROMETHAZINE HCL 25 MG PO TABS: 25.0000 mg | ORAL_TABLET | Freq: Four times a day (QID) | ORAL | Status: DC | PRN

## 2015-04-17 MED ORDER — OXYCODONE-ACETAMINOPHEN 5-325 MG PO TABS: 1.0000 | ORAL_TABLET | Freq: Four times a day (QID) | ORAL | Status: DC | PRN

## 2015-04-17 NOTE — MAU Provider Note (Signed)
History     CSN: [id number]  Arrival date and time: 04/17/15 1509   First Provider Initiated Contact with Patient 04/17/15 1555      Chief Complaint  Patient presents with  . Emesis  . Flank Pain   HPI Brenda Webb 29 y.o. [redacted]w[redacted]d  Comes to MAU with left flank pain that radiates to the LLQ.  Awakened this am with this pain and has had severe exacerbations of the pain which caused several episodes of vomiting.  Has not vomited since this morning.  Wanted to wait until Monday to see her doctor but the pain has not abated.  She is rocking with the pain and has taken a dose of Advil at home with no relief.  Had a kidney stone with her last pregnancy.  Has kidney stones even when not pregnant and is convinced this is the problem today.  Was hospitalized once with a previous pregnancy but was able to pass the stone.  Has a strong family history of kidney stones.  OB History    Gravida Para Term Preterm AB TAB SAB Ectopic Multiple Living   0 1 0 1 0 0 1      Past Medical History  Diagnosis Date  . Recent childbirth 7 1/2 months ago  . Depression   . Anxiety   . Psoriatic arthritis   . Arthritis   . Kidney stones   . Pregnancy induced hypertension     Past Surgical History  Procedure Laterality Date  . Tonsillectomy  2004  . Cholecystectomy  11/12/2011    Procedure: LAPAROSCOPIC CHOLECYSTECTOMY;  Surgeon: Atilano Ina, MD;  Location: [facility];  Service: General;  Laterality: N/A;    Family History  Problem Relation Age of Onset  . Cancer Paternal Grandfather     bladder    History  Substance Use Topics  . Smoking status: Former Games developer  . Smokeless tobacco: Never Used  . Alcohol Use: No    Allergies:  Allergies  Allergen Reactions  . Shrimp [Shellfish Allergy] Swelling    Prescriptions prior to admission  Medication Sig Dispense Refill Last Dose  . cefUROXime (CEFTIN) 500 MG tablet Take 500 mg by mouth 2 (two) times daily with a meal.   04/17/2015 at Unknown  time  . ibuprofen (ADVIL,MOTRIN) 200 MG tablet Take 600 mg by mouth every 6 (six) hours as needed for moderate pain.   04/17/2015 at Unknown time  . Prenatal Vit-Fe Fumarate-FA (PRENATAL MULTIVITAMIN) TABS tablet Take 1 tablet by mouth daily at 12 noon.   04/16/2015 at Unknown time  . oxyCODONE-acetaminophen (PERCOCET/ROXICET) 5-325 MG per tablet Take 1-2 tablets by mouth every 4 (four) hours as needed for severe pain. (Patient not taking: Reported on 04/17/2015) 20 tablet 0   . promethazine (PHENERGAN) 25 MG tablet Take 1 tablet (25 mg total) by mouth every 6 (six) hours as needed for nausea. 30 tablet 0   . promethazine (PHENERGAN) 25 MG tablet Take 1 tablet (25 mg total) by mouth every 6 (six) hours as needed for nausea or vomiting. (Patient not taking: Reported on 04/17/2015) 30 tablet 0     Review of Systems  Constitutional: Negative for fever.  Gastrointestinal: Positive for nausea, vomiting and abdominal pain.  Genitourinary: Positive for flank pain. Negative for dysuria.       No vaginal bleeding No leaking   Physical Exam   Blood pressure 138/79, pulse 94, temperature 98.3 F (36.8 C), resp. rate 18, last menstrual period  12/21/2014.  Physical Exam  Nursing note and vitals reviewed. Constitutional: She is oriented to person, place, and time. She appears well-developed and well-nourished.  Sitting upright with legs crossed in bed.  Constantly is gently rocking while holding her left side.  HENT:  Head: Normocephalic.  Eyes: EOM are normal.  Neck: Neck supple.  GI: Soft. There is tenderness. There is no rebound and no guarding.  Radiates from left side at the level of the umbilicus and to low midline.  Musculoskeletal: Normal range of motion.  Left flank pain  Neurological: She is alert and oriented to person, place, and time.  Skin: Skin is warm and dry.  Psychiatric: She has a normal mood and affect.    MAU Course  Procedures Results for orders placed or performed during the  hospital encounter of 04/17/15 (from the past 24 hour(s))  Urinalysis, Routine w reflex microscopic     Status: Abnormal   Collection Time: 04/17/15  3:15 PM  Result Value Ref Range   Color, Urine YELLOW YELLOW   APPearance HAZY (A) CLEAR   Specific Gravity, Urine >1.030 (H) 1.005 - 1.030   pH 6.0 5.0 - 8.0   Glucose, UA NEGATIVE NEGATIVE mg/dL   Hgb urine dipstick TRACE (A) NEGATIVE   Bilirubin Urine NEGATIVE NEGATIVE   Ketones, ur NEGATIVE NEGATIVE mg/dL   Protein, ur NEGATIVE NEGATIVE mg/dL   Urobilinogen, UA 0.2 0.0 - 1.0 mg/dL   Nitrite NEGATIVE NEGATIVE   Leukocytes, UA SMALL (A) NEGATIVE  Urine microscopic-add on     Status: Abnormal   Collection Time: 04/17/15  3:15 PM  Result Value Ref Range   Squamous Epithelial / LPF MANY (A) RARE   WBC, UA 0-2 <3 WBC/hpf   RBC / HPF 0-2 <3 RBC/hpf   Bacteria, UA RARE RARE  CBC with Differential     Status: Abnormal   Collection Time: 04/17/15  5:12 PM  Result Value Ref Range   WBC 12.9 (H) 4.0 - 10.5 K/uL   RBC 3.93 3.87 - 5.11 MIL/uL   Hemoglobin 12.4 12.0 - 15.0 g/dL   HCT 16.1 09.6 - 04.5 %   MCV 92.4 78.0 - 100.0 fL   MCH 31.6 26.0 - 34.0 pg   MCHC 34.2 30.0 - 36.0 g/dL   RDW 40.9 81.1 - 91.4 %   Platelets 199 150 - 400 K/uL   Neutrophils Relative % 69 43 - 77 %   Neutro Abs 8.9 (H) 1.7 - 7.7 K/uL   Lymphocytes Relative 23 12 - 46 %   Lymphs Abs 3.0 0.7 - 4.0 K/uL   Monocytes Relative 7 3 - 12 %   Monocytes Absolute 0.8 0.1 - 1.0 K/uL   Eosinophils Relative 1 0 - 5 %   Eosinophils Absolute 0.2 0.0 - 0.7 K/uL   Basophils Relative 0 0 - 1 %   Basophils Absolute 0.0 0.0 - 0.1 K/uL    MDM 1606  Consult with Dr. Chestine Spore and discussed the plan of care.  CLINICAL DATA: Left flank pain. Second trimester pregnancy.  EXAM: RENAL / URINARY TRACT ULTRASOUND COMPLETE  COMPARISON: 12/01/2014  FINDINGS: Right Kidney:  Length: 12.0 cm. Echogenicity within normal limits. No mass or hydronephrosis visualized.  Left  Kidney:  Length: 12.3 cm. Echogenicity within normal limits. No mass or hydronephrosis visualized.  Bladder:  Bilateral ureteral jets noted. There is a small amount of debris in the urinary bladder appreciated eccentric to the left side.  IMPRESSION: 1. Small amount of debris in  the urinary bladder, potentially sediment. Correlate with urine analysis. 2. No renal calculi identified. No hydronephrosis.  Pain worsened after ultrasound.  Will give another dose of Percocet and Phenergan for nausea.  Will give 2nd liter of IVF.   Assessment and Plan  [redacted] week gestation with probably kidney stone without hydronephrosis  Plan Will prescribe percocet PO for pain and Zofran for nausea to have at home. Client will follow up outpatient Return if you develop fever or repetitive vomiting. Strain all urine. Call the office if your symptoms are worsening.  BURLESON,TERRI 04/17/2015, 3:56 PM

## 2015-04-17 NOTE — MAU Note (Signed)
Pt presents to MAU with complaints of left flank pain since around 3am with vomiting. History of kidney stones in the past. Denies any vaginal bleeding

## 2015-04-17 NOTE — Discharge Instructions (Signed)
Strain all urine Get your prescriptions filled. Drink at least 8 8-oz glasses of water every day. See Dr. Dareen PianoAnderson for follow up Return if you have severe pain or severe vomiting.

## 2015-04-21 ENCOUNTER — Encounter (HOSPITAL_COMMUNITY): Payer: Self-pay | Admitting: *Deleted

## 2015-04-21 ENCOUNTER — Encounter (HOSPITAL_COMMUNITY): Payer: Self-pay | Admitting: Anesthesiology

## 2015-04-21 ENCOUNTER — Observation Stay (HOSPITAL_COMMUNITY)
Admission: AD | Admit: 2015-04-21 | Discharge: 2015-04-22 | Disposition: A | Discharge status: Home / Self Care | Payer: 59 | Source: Ambulatory Visit | Attending: Obstetrics & Gynecology | Admitting: Obstetrics & Gynecology

## 2015-04-21 DIAGNOSIS — Z87891 Personal history of nicotine dependence: Secondary | ICD-10-CM | POA: Diagnosis not present

## 2015-04-21 DIAGNOSIS — O2392 Unspecified genitourinary tract infection in pregnancy, second trimester: Secondary | ICD-10-CM | POA: Diagnosis not present

## 2015-04-21 DIAGNOSIS — Z3A17 17 weeks gestation of pregnancy: Secondary | ICD-10-CM | POA: Insufficient documentation

## 2015-04-21 DIAGNOSIS — Z872 Personal history of diseases of the skin and subcutaneous tissue: Secondary | ICD-10-CM | POA: Diagnosis not present

## 2015-04-21 DIAGNOSIS — R509 Fever, unspecified: Secondary | ICD-10-CM | POA: Diagnosis not present

## 2015-04-21 DIAGNOSIS — N200 Calculus of kidney: Secondary | ICD-10-CM | POA: Insufficient documentation

## 2015-04-21 DIAGNOSIS — O9989 Other specified diseases and conditions complicating pregnancy, childbirth and the puerperium: Secondary | ICD-10-CM | POA: Diagnosis not present

## 2015-04-21 DIAGNOSIS — R109 Unspecified abdominal pain: Secondary | ICD-10-CM | POA: Diagnosis not present

## 2015-04-21 DIAGNOSIS — O99342 Other mental disorders complicating pregnancy, second trimester: Secondary | ICD-10-CM | POA: Insufficient documentation

## 2015-04-21 DIAGNOSIS — F419 Anxiety disorder, unspecified: Secondary | ICD-10-CM | POA: Diagnosis not present

## 2015-04-21 DIAGNOSIS — F329 Major depressive disorder, single episode, unspecified: Secondary | ICD-10-CM | POA: Diagnosis not present

## 2015-04-21 DIAGNOSIS — M1990 Unspecified osteoarthritis, unspecified site: Secondary | ICD-10-CM | POA: Diagnosis not present

## 2015-04-21 DIAGNOSIS — O2302 Infections of kidney in pregnancy, second trimester: Secondary | ICD-10-CM

## 2015-04-21 LAB — CBC WITH DIFFERENTIAL/PLATELET
BASOS PCT: 0 % (ref 0–1)
Basophils Absolute: 0 10*3/uL (ref 0.0–0.1)
EOS PCT: 1 % (ref 0–5)
Eosinophils Absolute: 0.1 10*3/uL (ref 0.0–0.7)
HEMATOCRIT: 38.6 % (ref 36.0–46.0)
Hemoglobin: 13.2 g/dL (ref 12.0–15.0)
LYMPHS PCT: 20 % (ref 12–46)
Lymphs Abs: 1.8 10*3/uL (ref 0.7–4.0)
MCH: 31.4 pg (ref 26.0–34.0)
MCHC: 34.2 g/dL (ref 30.0–36.0)
MCV: 91.9 fL (ref 78.0–100.0)
Monocytes Absolute: 0.9 10*3/uL (ref 0.1–1.0)
Monocytes Relative: 9 % (ref 3–12)
Neutro Abs: 6.5 10*3/uL (ref 1.7–7.7)
Neutrophils Relative %: 70 % (ref 43–77)
PLATELETS: 181 10*3/uL (ref 150–400)
RBC: 4.2 MIL/uL (ref 3.87–5.11)
RDW: 12.8 % (ref 11.5–15.5)
WBC: 9.2 10*3/uL (ref 4.0–10.5)

## 2015-04-21 MED ORDER — MORPHINE SULFATE 4 MG/ML IJ SOLN
1.0000 mg | INTRAMUSCULAR | Status: DC | PRN
  Administered 2015-04-21 (×2): 2 mg via INTRAVENOUS
  Administered 2015-04-21 (×2): 1 mg via INTRAVENOUS
  Administered 2015-04-22 (×3): 2 mg via INTRAVENOUS
  Filled 2015-04-21 (×8): qty 1

## 2015-04-21 MED ORDER — ONDANSETRON HCL 4 MG PO TABS
4.0000 mg | ORAL_TABLET | Freq: Four times a day (QID) | ORAL | Status: DC | PRN
  Administered 2015-04-22: 4 mg via ORAL
  Filled 2015-04-21: qty 1

## 2015-04-21 MED ORDER — ONDANSETRON HCL 4 MG/2ML IJ SOLN
4.0000 mg | Freq: Four times a day (QID) | INTRAMUSCULAR | Status: DC | PRN
  Administered 2015-04-22: 4 mg via INTRAVENOUS
  Filled 2015-04-21: qty 2

## 2015-04-21 MED ORDER — ACETAMINOPHEN 325 MG PO TABS
650.0000 mg | ORAL_TABLET | Freq: Four times a day (QID) | ORAL | Status: DC | PRN
  Administered 2015-04-21 – 2015-04-22 (×2): 650 mg via ORAL
  Filled 2015-04-21 (×2): qty 2

## 2015-04-21 MED ORDER — SIMETHICONE 80 MG PO CHEW
80.0000 mg | CHEWABLE_TABLET | Freq: Four times a day (QID) | ORAL | Status: DC | PRN
Start: 2015-04-21 — End: 2015-04-22

## 2015-04-21 MED ORDER — SENNOSIDES-DOCUSATE SODIUM 8.6-50 MG PO TABS: 1.0000 | ORAL_TABLET | Freq: Every evening | ORAL | Status: DC | PRN

## 2015-04-21 MED ORDER — PRENATAL MULTIVITAMIN CH
1.0000 | ORAL_TABLET | Freq: Every day | ORAL | Status: DC
  Administered 2015-04-21 – 2015-04-22 (×2): 1 via ORAL
  Filled 2015-04-21 (×2): qty 1

## 2015-04-21 MED ORDER — CEFTRIAXONE SODIUM IN DEXTROSE 20 MG/ML IV SOLN
1.0000 g | INTRAVENOUS | Status: DC
  Administered 2015-04-21 – 2015-04-22 (×2): 1 g via INTRAVENOUS
  Filled 2015-04-21 (×2): qty 50

## 2015-04-21 MED ORDER — LACTATED RINGERS IV SOLN
INTRAVENOUS | Status: DC
  Administered 2015-04-21 – 2015-04-22 (×3): via INTRAVENOUS

## 2015-04-21 MED ORDER — ZOLPIDEM TARTRATE 5 MG PO TABS
5.0000 mg | ORAL_TABLET | Freq: Every evening | ORAL | Status: DC | PRN
Start: 2015-04-21 — End: 2015-04-22

## 2015-04-21 NOTE — H&P (Signed)
[name] is 10/11/1986  Patient's last menstrual period was 12/21/2014 (approximate). G3P1 at 17 weeks 2 days  With possible pyelonephritis.  She has severe flank / back pain that is not alleviated by the percocet she was given in MAU the other day.  She also reports fever, chills.  Renal US showed debris in the bladder but no stones and no hydronephrosis.  Patient notes urinating sediment.    PMHX  Past Medical History  Diagnosis Date  . Recent childbirth 7 1/2 months ago  . Depression   . Anxiety   . Psoriatic arthritis   . Arthritis   . Kidney stones   . Pregnancy induced hypertension     PSURG HX  Past Surgical History  Procedure Laterality Date  . Tonsillectomy  2004  . Cholecystectomy  11/12/2011    Procedure: LAPAROSCOPIC CHOLECYSTECTOMY;  Surgeon: Atilano InaEric M Wilson, MD;  Location: [facility];  Service: General;  Laterality: N/A;    Fam HX  Family History  Problem Relation Age of Onset  . Cancer Paternal Grandfather     bladder    Soc HX  History   Social History  . Marital Status: Married    Spouse Name: N/A  . Number of Children: N/A  . Years of Education: N/A   Occupational History  . Not on file.   Social History Main Topics  . Smoking status: Former Games developermoker  . Smokeless tobacco: Never Used  . Alcohol Use: No  . Drug Use: No  . Sexual Activity: Yes     Comment: mirena iud   Other Topics Concern  . Not on file   Social History Narrative    ROS  Review of Systems - History obtained from the patient General ROS: positive for  - chills, fatigue and fever negative for - chest pain shortness of breath Respiratory ROS: no cough, shortness of breath, or wheezing Cardiovascular ROS: no chest pain or dyspnea on exertion  US from office showed single IUP  Gen WDWN  IN NAD BACK: + CVA tenderness  A/P: IUP 3244w2d with flank pain urine dip +LE and blood,  Concern for kidney stones vs pylenophritis.  IV hydration IV pain management Urine cx prior  to starting abx IV Rocephin Dispo based on sx

## 2015-04-22 DIAGNOSIS — O9989 Other specified diseases and conditions complicating pregnancy, childbirth and the puerperium: Secondary | ICD-10-CM | POA: Diagnosis not present

## 2015-04-22 LAB — URINE CULTURE

## 2015-04-22 LAB — CBC
HEMATOCRIT: 34.4 % — AB (ref 36.0–46.0)
HEMOGLOBIN: 11.6 g/dL — AB (ref 12.0–15.0)
MCH: 31 pg (ref 26.0–34.0)
MCHC: 33.7 g/dL (ref 30.0–36.0)
MCV: 92 fL (ref 78.0–100.0)
Platelets: 173 10*3/uL (ref 150–400)
RBC: 3.74 MIL/uL — ABNORMAL LOW (ref 3.87–5.11)
RDW: 12.8 % (ref 11.5–15.5)
WBC: 7.5 10*3/uL (ref 4.0–10.5)

## 2015-04-22 MED ORDER — HYDROMORPHONE HCL 2 MG PO TABS
2.0000 mg | ORAL_TABLET | ORAL | Status: DC | PRN
  Administered 2015-04-22: 2 mg via ORAL
  Filled 2015-04-22: qty 1

## 2015-04-22 MED ORDER — HYDROMORPHONE HCL 2 MG PO TABS: 2.0000 mg | ORAL_TABLET | ORAL | Status: DC | PRN

## 2015-04-22 NOTE — Progress Notes (Signed)
Pt verbalizes understanding of d/c instructions, medications, follow up appts, when to seek medical care and belongings policy. Pt has no questions at this time. Pts IV was removed without complications. Pt ambulate to main entrance, accompanied by NT. Pts husband is here and will be driving her home. Sheryn BisonGordon, Ravenne Wayment Warner

## 2015-04-22 NOTE — Progress Notes (Signed)
Pt states that she feels better today. She had a normal CBC and U/A. The C&S is pending. She would like to go home. She gets relief with Dilaudid. She states that she has no UTI symptoms. She states that the pain feels like a stone.  VSSAF IMP/ Likely kidney stone.         Doubt pyelo.  Plan/ Will discharge to home with keflex and dilaudid           Await C&S results.

## 2015-04-22 NOTE — Progress Notes (Signed)
Notified Dr. Dareen PianoAnderson that pts pain is now controlled on po meds. Pt states that she is comfortable at the pain level she is at which is around a 3/10. He states he will be working on d/c orders this evening. Sheryn BisonGordon, Saher Davee Warner

## 2015-04-23 NOTE — Discharge Summary (Signed)
NAMLouis Matte:  Webb, Brenda              ACCOUNT NO.:  [id number]  MEDICAL RECORD NO.:  [id number]  LOCATION:  9311                          FACILITY:  [facility]  PHYSICIAN:  Brenda Webb, M.D.    DATE OF BIRTH:  08-16-1986  DATE OF PROCEDURE: DATE OF DISCHARGE:  04/22/2015                              OPERATIVE REPORT   PRINCIPAL DISCHARGE DIAGNOSES: 1. Intrauterine pregnancy at 17-1/2 weeks. 2. Kidney stone.  HISTORY OF PRESENT ILLNESS:  Ms. Brenda Webb is a 29 year old, white female, G3, P1-0-1-1, at 17-4/7 weeks who was admitted by Dr. Essie HartWalda Pinn for possible pyelonephritis.  Patient had been complaining of severe flank pain for a week.  She thought that she had the kidney stone.  She did have a history of this and stated the pain was similar.  She was not getting relief with Percocet and when seeing Dr. Mora ApplPinn, she reported fever and chills.  She did have a renal ultrasound, which revealed no stones or hydronephrosis.  Her urinalysis revealed the sediment, otherwise negative.  The patient did have CVA tenderness reported by Dr. Mora ApplPinn.  Patient was admitted and begun on IV Rocephin, a urine culture was obtained.  The urine culture subsequently returned negative except for some yeast.  The patient had a decrease in her pain on hospital day #1.  At that time, she was taking Dilaudid and stated that this was adequate for her pain.  The patient had a strong desire to go home.  She had no CVA tenderness.  She was discharged to home.  She was told to continue her Ceftin for another 4 days.  She was sent home with Dilaudid.  She will follow up in the office in 1 week.  She will continue to strain her urine.          ______________________________ Brenda Webb, M.D.     MA/MEDQ  D:  04/23/2015  T:  04/23/2015  Job:  [id number]

## 2015-05-27 ENCOUNTER — Encounter (HOSPITAL_COMMUNITY): Payer: Self-pay | Admitting: *Deleted

## 2015-05-27 ENCOUNTER — Encounter (HOSPITAL_COMMUNITY): Payer: Self-pay

## 2015-05-27 ENCOUNTER — Inpatient Hospital Stay (HOSPITAL_COMMUNITY)
Admission: AD | Admit: 2015-05-27 | Discharge: 2015-05-27 | Disposition: A | Discharge status: Home / Self Care | Payer: 59 | Source: Ambulatory Visit | Attending: Obstetrics | Admitting: Obstetrics

## 2015-05-27 DIAGNOSIS — O9989 Other specified diseases and conditions complicating pregnancy, childbirth and the puerperium: Secondary | ICD-10-CM | POA: Diagnosis not present

## 2015-05-27 DIAGNOSIS — Z3A22 22 weeks gestation of pregnancy: Secondary | ICD-10-CM | POA: Insufficient documentation

## 2015-05-27 DIAGNOSIS — Z87891 Personal history of nicotine dependence: Secondary | ICD-10-CM | POA: Insufficient documentation

## 2015-05-27 DIAGNOSIS — N200 Calculus of kidney: Secondary | ICD-10-CM | POA: Diagnosis not present

## 2015-05-27 LAB — CBC WITH DIFFERENTIAL/PLATELET
Basophils Absolute: 0 10*3/uL (ref 0.0–0.1)
Basophils Relative: 0 % (ref 0–1)
EOS ABS: 0.2 10*3/uL (ref 0.0–0.7)
EOS PCT: 2 % (ref 0–5)
HCT: 38.1 % (ref 36.0–46.0)
HEMOGLOBIN: 13.1 g/dL (ref 12.0–15.0)
LYMPHS ABS: 1.6 10*3/uL (ref 0.7–4.0)
Lymphocytes Relative: 11 % — ABNORMAL LOW (ref 12–46)
MCH: 31.7 pg (ref 26.0–34.0)
MCHC: 34.4 g/dL (ref 30.0–36.0)
MCV: 92.3 fL (ref 78.0–100.0)
MONO ABS: 1 10*3/uL (ref 0.1–1.0)
Monocytes Relative: 7 % (ref 3–12)
Neutro Abs: 11.8 10*3/uL — ABNORMAL HIGH (ref 1.7–7.7)
Neutrophils Relative %: 80 % — ABNORMAL HIGH (ref 43–77)
Platelets: 213 10*3/uL (ref 150–400)
RBC: 4.13 MIL/uL (ref 3.87–5.11)
RDW: 12.8 % (ref 11.5–15.5)
WBC: 14.6 10*3/uL — AB (ref 4.0–10.5)

## 2015-05-27 LAB — URINALYSIS, ROUTINE W REFLEX MICROSCOPIC
Bilirubin Urine: NEGATIVE
Glucose, UA: NEGATIVE mg/dL
KETONES UR: NEGATIVE mg/dL
Nitrite: NEGATIVE
PH: 6 (ref 5.0–8.0)
PROTEIN: NEGATIVE mg/dL
SPECIFIC GRAVITY, URINE: 1.01 (ref 1.005–1.030)
Urobilinogen, UA: 0.2 mg/dL (ref 0.0–1.0)

## 2015-05-27 LAB — URINE MICROSCOPIC-ADD ON

## 2015-05-27 MED ORDER — HYDROMORPHONE HCL 2 MG PO TABS: 2.0000 mg | ORAL_TABLET | Freq: Four times a day (QID) | ORAL | Status: DC | PRN

## 2015-05-27 MED ORDER — SODIUM CHLORIDE 0.9 % IV SOLN
12.5000 mg | Freq: Once | INTRAVENOUS | Status: AC
  Administered 2015-05-27: 12.5 mg via INTRAVENOUS
  Filled 2015-05-27: qty 0.5

## 2015-05-27 MED ORDER — HYDROMORPHONE HCL 1 MG/ML IJ SOLN
0.3000 mg | Freq: Once | INTRAMUSCULAR | Status: AC
  Administered 2015-05-27: 0.3 mg via INTRAVENOUS
  Filled 2015-05-27: qty 1

## 2015-05-27 NOTE — MAU Provider Note (Signed)
History     CSN: [id number]  Arrival date and time: 05/27/15 1034   First Provider Initiated Contact with Patient 05/27/15 1214      Chief Complaint  Patient presents with  . Flank Pain  . Nausea   HPI Brenda Webb 29 y.o. G3P1011 @[redacted]w[redacted]d  presents to MAU complaining of possible kidney stone.  Yesterday she noted dull ache in right back.  At 3am today, she awoke to severe pain, 10/10 in right back radiating to right flank.  She has not used any medication.  She is feeling more nauseous with the pain.  Heating pad to back was somewhat helpful.  She has not eaten or drank much.  She has twinges or spasms in her bladder when she urinates but no dysuria or hematuria.  She denies weakness, fever, abdominal pain, VB, LOF.  Baby is moving well.   OB History    Gravida Para Term Preterm AB TAB SAB Ectopic Multiple Living   3 1 1  0 1 0 1 0 0 1      Past Medical History  Diagnosis Date  . Recent childbirth 7 1/2 months ago  . Depression   . Anxiety   . Psoriatic arthritis   . Arthritis   . Kidney stones   . Pregnancy induced hypertension     Past Surgical History  Procedure Laterality Date  . Tonsillectomy  2004  . Cholecystectomy  11/12/2011    Procedure: LAPAROSCOPIC CHOLECYSTECTOMY;  Surgeon: Atilano Ina, MD;  Location: [facility];  Service: General;  Laterality: N/A;    Family History  Problem Relation Age of Onset  . Cancer Paternal Grandfather     bladder    History  Substance Use Topics  . Smoking status: Former Games developer  . Smokeless tobacco: Never Used  . Alcohol Use: No    Allergies:  Allergies  Allergen Reactions  . Shrimp [Shellfish Allergy] Swelling    Prescriptions prior to admission  Medication Sig Dispense Refill Last Dose  . ibuprofen (ADVIL,MOTRIN) 200 MG tablet Take 400 mg by mouth every 6 (six) hours as needed for moderate pain.   05/26/2015 at Unknown time  . ondansetron (ZOFRAN ODT) 8 MG disintegrating tablet Take 1 tablet (8 mg total) by mouth  every 8 (eight) hours as needed for nausea or vomiting. 12 tablet 0 Past Month at Unknown time  . Prenatal Vit-Fe Fumarate-FA (PRENATAL MULTIVITAMIN) TABS tablet Take 1 tablet by mouth daily at 12 noon.   05/26/2015 at Unknown time  . HYDROmorphone (DILAUDID) 2 MG tablet Take 1-2 tablets (2-4 mg total) by mouth every 4 (four) hours as needed for severe pain. (Patient not taking: Reported on 05/27/2015) 30 tablet 0   . promethazine (PHENERGAN) 25 MG tablet Take 1 tablet (25 mg total) by mouth every 6 (six) hours as needed for nausea or vomiting. (Patient not taking: Reported on 04/17/2015) 30 tablet 0   . promethazine (PHENERGAN) 25 MG tablet Take 1 tablet (25 mg total) by mouth every 6 (six) hours as needed for nausea. (Patient not taking: Reported on 04/21/2015) 30 tablet 0     ROS Pertinent ROS in HPI.  All other systems are negative.   Physical Exam   Blood pressure 125/80, pulse 98, temperature 98.3 F (36.8 C), temperature source Oral, resp. rate 18, height 5\' 5"  (1.651 m), weight 181 lb (82.101 kg), last menstrual period 12/21/2014, SpO2 100 %.  Physical Exam  Constitutional: She is oriented to person, place, and time. She appears well-developed and  well-nourished. No distress.  HENT:  Head: Normocephalic and atraumatic.  Eyes: EOM are normal.  Neck: Normal range of motion.  Cardiovascular: Normal rate, regular rhythm and normal heart sounds.   Respiratory: Effort normal and breath sounds normal. No respiratory distress.  GI: Soft. Bowel sounds are normal. She exhibits no distension.  Positive for CVA tenderness on right.  Tenderness in general along right flank into lateral abdomen  Musculoskeletal: Normal range of motion.  Neurological: She is alert and oriented to person, place, and time.  Skin: Skin is warm and dry.  Psychiatric: She has a normal mood and affect.   Results for orders placed or performed during the hospital encounter of 05/27/15 (from the past 24 hour(s))   Urinalysis, Routine w reflex microscopic (not at [facility])     Status: Abnormal   Collection Time: 05/27/15 10:45 AM  Result Value Ref Range   Color, Urine YELLOW YELLOW   APPearance CLEAR CLEAR   Specific Gravity, Urine 1.010 1.005 - 1.030   pH 6.0 5.0 - 8.0   Glucose, UA NEGATIVE NEGATIVE mg/dL   Hgb urine dipstick LARGE (A) NEGATIVE   Bilirubin Urine NEGATIVE NEGATIVE   Ketones, ur NEGATIVE NEGATIVE mg/dL   Protein, ur NEGATIVE NEGATIVE mg/dL   Urobilinogen, UA 0.2 0.0 - 1.0 mg/dL   Nitrite NEGATIVE NEGATIVE   Leukocytes, UA SMALL (A) NEGATIVE  Urine microscopic-add on     Status: Abnormal   Collection Time: 05/27/15 10:45 AM  Result Value Ref Range   Squamous Epithelial / LPF FEW (A) RARE   WBC, UA 3-6 <3 WBC/hpf   RBC / HPF 7-10 <3 RBC/hpf   Bacteria, UA FEW (A) RARE   Urine-Other MUCOUS PRESENT   CBC with Differential/Platelet     Status: Abnormal   Collection Time: 05/27/15  1:30 PM  Result Value Ref Range   WBC 14.6 (H) 4.0 - 10.5 K/uL   RBC 4.13 3.87 - 5.11 MIL/uL   Hemoglobin 13.1 12.0 - 15.0 g/dL   HCT 16.1 09.6 - 04.5 %   MCV 92.3 78.0 - 100.0 fL   MCH 31.7 26.0 - 34.0 pg   MCHC 34.4 30.0 - 36.0 g/dL   RDW 40.9 81.1 - 91.4 %   Platelets 213 150 - 400 K/uL   Neutrophils Relative % 80 (H) 43 - 77 %   Neutro Abs 11.8 (H) 1.7 - 7.7 K/uL   Lymphocytes Relative 11 (L) 12 - 46 %   Lymphs Abs 1.6 0.7 - 4.0 K/uL   Monocytes Relative 7 3 - 12 %   Monocytes Absolute 1.0 0.1 - 1.0 K/uL   Eosinophils Relative 2 0 - 5 %   Eosinophils Absolute 0.2 0.0 - 0.7 K/uL   Basophils Relative 0 0 - 1 %   Basophils Absolute 0.0 0.0 - 0.1 K/uL    MAU Course  Procedures  MDM Discussed with Dr. Chestine Spore whom advises for 1 liter NS, CBC with diff, 12.5mg  IV phenergan, 0.4mg  IV dilaudid and recheck in 1 hour.  Pt notes improvement in pain with Dilaudid initially.  However after urinating, her pain has returned.   Dr. Chestine Spore advised of results and pt condition.  Option given to pt for  admit vs po pain mgmt.  Pt chooses home po pain mgmt.  Dr. Chestine Spore advising Dilaudid  po q 6 hours prn, collect any stones that pass for analysis, push po fluids and report back to MAU for fever >101.   Has appt 6/14.  Assessment and  Plan  A:  1. Kidney stones    P: Discharge to home Dilaudid   Push PO fluids Strain urine and collect stones for analysis Return to MAU for fever Keep appt 6/14. Patient may return to MAU as needed or if her condition were to change or worsen   Bertram Denver 05/27/2015, 12:15 PM

## 2015-05-27 NOTE — Progress Notes (Signed)
CRNA requested to start IV-pt states last time anesthesia had to start her IV.

## 2015-05-27 NOTE — MAU Note (Signed)
Rt sided flank pain started yesterday.  Hx of kidney stones.  Woke up during the pain, was worse, nauseated and had chills.

## 2015-05-27 NOTE — Discharge Instructions (Signed)

## 2015-05-27 NOTE — Progress Notes (Signed)
Pt updated that we are continuing to wait on provider to see her. Pt restless in bed from pain. Offered ice chips. States she is nauseous. Last vomited this am. Given emesis bag. Pt states understanding on POC.

## 2015-05-28 LAB — URINE CULTURE
CULTURE: NO GROWTH
Colony Count: NO GROWTH

## 2015-05-31 ENCOUNTER — Other Ambulatory Visit: Payer: Self-pay | Admitting: Obstetrics

## 2015-05-31 DIAGNOSIS — N23 Unspecified renal colic: Secondary | ICD-10-CM

## 2015-06-01 ENCOUNTER — Ambulatory Visit (HOSPITAL_COMMUNITY)
Admission: RE | Admit: 2015-06-01 | Discharge: 2015-06-01 | Disposition: A | Discharge status: Home / Self Care | Payer: 59 | Source: Ambulatory Visit | Attending: Obstetrics | Admitting: Obstetrics

## 2015-06-01 DIAGNOSIS — N23 Unspecified renal colic: Secondary | ICD-10-CM | POA: Insufficient documentation

## 2015-06-01 DIAGNOSIS — N1330 Unspecified hydronephrosis: Secondary | ICD-10-CM | POA: Insufficient documentation

## 2015-06-21 ENCOUNTER — Ambulatory Visit (HOSPITAL_COMMUNITY)
Admission: RE | Admit: 2015-06-21 | Discharge: 2015-06-21 | Disposition: A | Discharge status: Home / Self Care | Payer: 59 | Source: Ambulatory Visit | Attending: Obstetrics and Gynecology | Admitting: Obstetrics and Gynecology

## 2015-06-21 ENCOUNTER — Other Ambulatory Visit (HOSPITAL_COMMUNITY): Payer: Self-pay | Admitting: Obstetrics and Gynecology

## 2015-06-21 DIAGNOSIS — N1330 Unspecified hydronephrosis: Secondary | ICD-10-CM | POA: Insufficient documentation

## 2015-06-21 DIAGNOSIS — O2302 Infections of kidney in pregnancy, second trimester: Principal | ICD-10-CM | POA: Diagnosis present

## 2015-06-21 DIAGNOSIS — R109 Unspecified abdominal pain: Secondary | ICD-10-CM | POA: Insufficient documentation

## 2015-06-21 DIAGNOSIS — N134 Hydroureter: Secondary | ICD-10-CM

## 2015-06-21 DIAGNOSIS — Z3A26 26 weeks gestation of pregnancy: Secondary | ICD-10-CM | POA: Diagnosis present

## 2015-06-21 DIAGNOSIS — Z87891 Personal history of nicotine dependence: Secondary | ICD-10-CM

## 2015-06-21 DIAGNOSIS — N200 Calculus of kidney: Secondary | ICD-10-CM

## 2015-06-21 DIAGNOSIS — Z87442 Personal history of urinary calculi: Secondary | ICD-10-CM

## 2015-06-21 DIAGNOSIS — M545 Low back pain: Secondary | ICD-10-CM | POA: Diagnosis not present

## 2015-06-22 ENCOUNTER — Encounter (HOSPITAL_COMMUNITY): Payer: Self-pay

## 2015-06-22 ENCOUNTER — Inpatient Hospital Stay (HOSPITAL_COMMUNITY)
Admission: AD | Admit: 2015-06-22 | Discharge: 2015-06-25 | DRG: 781 | Disposition: A | Discharge status: Home / Self Care | Payer: 59 | Source: Ambulatory Visit | Attending: Obstetrics & Gynecology | Admitting: Obstetrics & Gynecology

## 2015-06-22 DIAGNOSIS — O2302 Infections of kidney in pregnancy, second trimester: Secondary | ICD-10-CM | POA: Diagnosis present

## 2015-06-22 DIAGNOSIS — O26832 Pregnancy related renal disease, second trimester: Secondary | ICD-10-CM

## 2015-06-22 DIAGNOSIS — N1330 Unspecified hydronephrosis: Secondary | ICD-10-CM

## 2015-06-22 DIAGNOSIS — N200 Calculus of kidney: Secondary | ICD-10-CM

## 2015-06-22 LAB — BASIC METABOLIC PANEL
ANION GAP: 6 (ref 5–15)
BUN: 8 mg/dL (ref 6–20)
CALCIUM: 8.9 mg/dL (ref 8.9–10.3)
CHLORIDE: 108 mmol/L (ref 101–111)
CO2: 22 mmol/L (ref 22–32)
CREATININE: 0.74 mg/dL (ref 0.44–1.00)
GFR calc Af Amer: >60 mL/min (ref >60)
GFR calc non Af Amer: >60 mL/min (ref >60)
Glucose, Bld: 125 mg/dL — ABNORMAL HIGH (ref 65–99)
Potassium: 3.6 mmol/L (ref 3.5–5.1)
SODIUM: 136 mmol/L (ref 135–145)

## 2015-06-22 LAB — URINE MICROSCOPIC-ADD ON

## 2015-06-22 LAB — URINALYSIS, ROUTINE W REFLEX MICROSCOPIC
BILIRUBIN URINE: NEGATIVE
Glucose, UA: NEGATIVE mg/dL
KETONES UR: NEGATIVE mg/dL
NITRITE: NEGATIVE
Protein, ur: NEGATIVE mg/dL
Specific Gravity, Urine: <1.005 — ABNORMAL LOW (ref 1.005–1.030)
UROBILINOGEN UA: 0.2 mg/dL (ref 0.0–1.0)
pH: 6.5 (ref 5.0–8.0)

## 2015-06-22 MED ORDER — HYDROMORPHONE HCL 1 MG/ML IJ SOLN
1.0000 mg | Freq: Once | INTRAMUSCULAR | Status: AC
Start: 2015-06-22 — End: 2015-06-22
  Administered 2015-06-22: 1 mg via INTRAVENOUS
  Filled 2015-06-22: qty 1

## 2015-06-22 MED ORDER — LACTATED RINGERS IV SOLN
INTRAVENOUS | Status: DC
  Administered 2015-06-22 – 2015-06-25 (×8): via INTRAVENOUS

## 2015-06-22 MED ORDER — HYDROMORPHONE HCL 1 MG/ML IJ SOLN
1.0000 mg | Freq: Once | INTRAMUSCULAR | Status: AC
  Administered 2015-06-22: 1 mg via INTRAVENOUS
  Filled 2015-06-22: qty 1

## 2015-06-22 MED ORDER — PROMETHAZINE HCL 25 MG PO TABS
25.0000 mg | ORAL_TABLET | Freq: Four times a day (QID) | ORAL | Status: DC | PRN
  Administered 2015-06-22 – 2015-06-24 (×4): 25 mg via ORAL
  Filled 2015-06-22 (×4): qty 1

## 2015-06-22 MED ORDER — HYDROMORPHONE 0.3 MG/ML IV SOLN
INTRAVENOUS | Status: DC
  Administered 2015-06-22: 23:00:00 via INTRAVENOUS
  Administered 2015-06-23: 8.33 mL via INTRAVENOUS
  Administered 2015-06-23: 6 mg via INTRAVENOUS
  Administered 2015-06-23: 4.5 mg via INTRAVENOUS
  Administered 2015-06-23: 14.6 mg via INTRAVENOUS
  Administered 2015-06-23: 8 mg via INTRAVENOUS
  Administered 2015-06-23: via INTRAVENOUS
  Administered 2015-06-23: 4 mg via INTRAVENOUS
  Administered 2015-06-24: 3.6 mg via INTRAVENOUS
  Administered 2015-06-24: 7 mg via INTRAVENOUS
  Administered 2015-06-24: 14:00:00 via INTRAVENOUS
  Administered 2015-06-24: 10 mg via INTRAVENOUS
  Filled 2015-06-22 (×4): qty 25

## 2015-06-22 MED ORDER — CALCIUM CARBONATE ANTACID 500 MG PO CHEW
2.0000 | CHEWABLE_TABLET | ORAL | Status: DC | PRN
  Filled 2015-06-22: qty 2

## 2015-06-22 MED ORDER — PRENATAL MULTIVITAMIN CH
1.0000 | ORAL_TABLET | Freq: Every day | ORAL | Status: DC
  Administered 2015-06-23 – 2015-06-25 (×2): 1 via ORAL
  Filled 2015-06-22 (×3): qty 1

## 2015-06-22 MED ORDER — LACTATED RINGERS IV BOLUS (SEPSIS)
1000.0000 mL | Freq: Once | INTRAVENOUS | Status: AC
Start: 2015-06-22 — End: 2015-06-22
  Administered 2015-06-22: 1000 mL via INTRAVENOUS

## 2015-06-22 MED ORDER — ACETAMINOPHEN 325 MG PO TABS
650.0000 mg | ORAL_TABLET | ORAL | Status: DC | PRN
  Administered 2015-06-25: 650 mg via ORAL
  Filled 2015-06-22 (×2): qty 2

## 2015-06-22 MED ORDER — DOCUSATE SODIUM 100 MG PO CAPS
100.0000 mg | ORAL_CAPSULE | Freq: Every day | ORAL | Status: DC
  Administered 2015-06-22 – 2015-06-25 (×4): 100 mg via ORAL
  Filled 2015-06-22 (×5): qty 1

## 2015-06-22 MED ORDER — NITROFURANTOIN MONOHYD MACRO 100 MG PO CAPS
100.0000 mg | ORAL_CAPSULE | Freq: Every day | ORAL | Status: DC
  Administered 2015-06-22 – 2015-06-24 (×3): 100 mg via ORAL
  Filled 2015-06-22 (×3): qty 1

## 2015-06-22 NOTE — MAU Note (Signed)
Passed small stone on Sat.  Started having bad pain, chillls on Sunday.  Had outpt renal US yesterday.   Call this morning, rt ureter "is not jetting right, some sort of obstruction".  Was told if the pain got bad to come in .

## 2015-06-22 NOTE — MAU Provider Note (Signed)
History     CSN: [id number]  Arrival date and time: 06/22/15 1527   First Provider Initiated Contact with Patient 06/22/15 1923      Chief Complaint  Patient presents with  . Nephrolithiasis   HPI   Brenda Webb is a 29 y.o. female G3P1011 at 10911w1d presenting to MAU with right lower back pain that radiates to abdomen. She also is feeling spasms in her vagina. On Saturday she passed a kidney stone that she saw in the toilet. After that she felt much better and slept Saturday night without pain. On Sunday morning she started feeling worse; chills, shaking, and she could not get comfortable.  On Tuesday she was sent here for an outpatient US which showed a possible obstruction in her ureter.   She is able to urinate with no problem She is currently taking flomax, percocet and macrobid. She took medication this morning; and it helped some. She currently rates her pain 8/10.   + fetal movement.  Denies vaginal bleeding.    OB History    Gravida Para Term Preterm AB TAB SAB Ectopic Multiple Living   3 1 1  0 1 0 1 0 0 1      Past Medical History  Diagnosis Date  . Recent childbirth 7 1/2 months ago  . Depression   . Anxiety   . Psoriatic arthritis   . Arthritis   . Kidney stones   . Pregnancy induced hypertension     Past Surgical History  Procedure Laterality Date  . Tonsillectomy  2004  . Cholecystectomy  11/12/2011    Procedure: LAPAROSCOPIC CHOLECYSTECTOMY;  Surgeon: Atilano InaEric M Wilson, MD;  Location: [facility];  Service: General;  Laterality: N/A;    Family History  Problem Relation Age of Onset  . Cancer Paternal Grandfather     bladder    History  Substance Use Topics  . Smoking status: Former Games developermoker  . Smokeless tobacco: Never Used  . Alcohol Use: No    Allergies:  Allergies  Allergen Reactions  . Shrimp [Shellfish Allergy] Swelling    Prescriptions prior to admission  Medication Sig Dispense Refill Last Dose  . acetaminophen (TYLENOL) 325 MG  tablet Take 650 mg by mouth every 6 (six) hours as needed for moderate pain.   06/22/2015 at Unknown time  . calcium carbonate (TUMS - DOSED IN MG ELEMENTAL CALCIUM) 500 MG chewable tablet Chew 2 tablets by mouth daily as needed for indigestion or heartburn.   06/21/2015 at Unknown time  . nitrofurantoin (MACRODANTIN) 100 MG capsule Take 100 mg by mouth 2 (two) times daily.   06/22/2015 at Unknown time  . ondansetron (ZOFRAN ODT) 8 MG disintegrating tablet Take 1 tablet (8 mg total) by mouth every 8 (eight) hours as needed for nausea or vomiting. 12 tablet 0 Past Month at Unknown time  . oxyCODONE-acetaminophen (PERCOCET/ROXICET) 5-325 MG per tablet Take 1 tablet by mouth every 4 (four) hours as needed for severe pain.   06/22/2015 at Unknown time  . Prenatal Vit-Fe Fumarate-FA (PRENATAL MULTIVITAMIN) TABS tablet Take 1 tablet by mouth daily at 12 noon.   06/21/2015 at Unknown time  . tamsulosin (FLOMAX) 0.4 MG CAPS capsule Take 0.4 mg by mouth daily.   06/22/2015 at Unknown time   Results for orders placed or performed during the hospital encounter of 06/22/15 (from the past 48 hour(s))  Urinalysis, Routine w reflex microscopic (not [facility])     Status: Abnormal   Collection Time: 06/22/15  3:55  PM  Result Value Ref Range   Color, Urine YELLOW YELLOW   APPearance CLEAR CLEAR   Specific Gravity, Urine <1.005 (L) 1.005 - 1.030   pH 6.5 5.0 - 8.0   Glucose, UA NEGATIVE NEGATIVE mg/dL   Hgb urine dipstick SMALL (A) NEGATIVE   Bilirubin Urine NEGATIVE NEGATIVE   Ketones, ur NEGATIVE NEGATIVE mg/dL   Protein, ur NEGATIVE NEGATIVE mg/dL   Urobilinogen, UA 0.2 0.0 - 1.0 mg/dL   Nitrite NEGATIVE NEGATIVE   Leukocytes, UA SMALL (A) NEGATIVE  Urine microscopic-add on     Status: Abnormal   Collection Time: 06/22/15  3:55 PM  Result Value Ref Range   Squamous Epithelial / LPF FEW (A) RARE   WBC, UA 0-2 <3 WBC/hpf   RBC / HPF 3-6 <3 RBC/hpf   Bacteria, UA FEW (A) RARE     US Renal  06/21/2015   ADDENDUM  REPORT: 06/21/2015 15:17  ADDENDUM: The technologist performed additional images which did reveal a right ureteral jet. However, the distal right ureter itself could not be visualized and apparently is not dilated. Thus the obstruction in the right ureter is not total and the obstruction likely lies in the region of the junction of the mid and distal portions of the ureter.   Electronically Signed   By: David  [place] M.D.   On: 06/21/2015 15:17   06/21/2015   CLINICAL DATA:  Onset of right flank pain 2 days ago, history of urinary tract stones  EXAM: RENAL / URINARY TRACT ULTRASOUND COMPLETE  COMPARISON:  Renal ultrasound of June 01, 2015  FINDINGS: Right Kidney:  Length: 13.9 cm .There is moderate right-sided hydronephrosis similar to that observed on the previous study. There is dilation of the proximal right ureter to 1 point 4 cm no right ureteral jet is demonstrated.  Left Kidney:  Length: 12.2 cm. Echogenicity within normal limits. No mass or hydronephrosis visualized.  Bladder:  Appears normal for degree of bladder distention. A left ureteral jet is demonstrated.  IMPRESSION: 1. Moderate right-sided hydronephrosis and hydroureter of the proximal and mid right ureter. The distal ureter is not visible. There is no right ureteral jet. 2. The left kidney and urinary bladder are unremarkable.  Electronically Signed: By: David  [place] M.D. On: 06/21/2015 15:04    Review of Systems  Constitutional: Negative for fever.  Gastrointestinal: Positive for nausea and vomiting.  Genitourinary: Positive for urgency and hematuria. Negative for dysuria and frequency.   Physical Exam   Blood pressure 131/72, pulse 97, temperature 98.7 F (37.1 C), temperature source Oral, resp. rate 20, last menstrual period 12/21/2014.  Physical Exam  Constitutional: She is oriented to person, place, and time. She appears well-developed and well-nourished.  Respiratory: Effort normal.  Musculoskeletal: Normal range of motion.   Neurological: She is alert and oriented to person, place, and time.  Skin: Skin is warm.  Psychiatric: Her behavior is normal.   Results for orders placed or performed during the hospital encounter of 06/22/15 (from the past 24 hour(s))  Urinalysis, Routine w reflex microscopic (not at [facility])     Status: Abnormal   Collection Time: 06/22/15  3:55 PM  Result Value Ref Range   Color, Urine YELLOW YELLOW   APPearance CLEAR CLEAR   Specific Gravity, Urine <1.005 (L) 1.005 - 1.030   pH 6.5 5.0 - 8.0   Glucose, UA NEGATIVE NEGATIVE mg/dL   Hgb urine dipstick SMALL (A) NEGATIVE   Bilirubin Urine NEGATIVE NEGATIVE   Ketones, ur NEGATIVE  NEGATIVE mg/dL   Protein, ur NEGATIVE NEGATIVE mg/dL   Urobilinogen, UA 0.2 0.0 - 1.0 mg/dL   Nitrite NEGATIVE NEGATIVE   Leukocytes, UA SMALL (A) NEGATIVE  Urine microscopic-add on     Status: Abnormal   Collection Time: 06/22/15  3:55 PM  Result Value Ref Range   Squamous Epithelial / LPF FEW (A) RARE   WBC, UA 0-2 <3 WBC/hpf   RBC / HPF 3-6 <3 RBC/hpf   Bacteria, UA FEW (A) RARE  Basic metabolic panel     Status: Abnormal   Collection Time: 06/22/15  7:52 PM  Result Value Ref Range   Sodium 136 135 - 145 mmol/L   Potassium 3.6 3.5 - 5.1 mmol/L   Chloride 108 101 - 111 mmol/L   CO2 22 22 - 32 mmol/L   Glucose, Bld 125 (H) 65 - 99 mg/dL   BUN 8 6 - 20 mg/dL   Creatinine, Ser 1.61 0.44 - 1.00 mg/dL   Calcium 8.9 8.9 - 09.6 mg/dL   GFR calc non Af Amer >60 >60 mL/min   GFR calc Af Amer >60 >60 mL/min   Anion gap 6 5 - 15     MAU Course  Procedures  None  MDM  Discussed patient with Dr. Chestine Spore including out patient [place] from 7/5; Dr .Chestine Spore reviewed  Dilaudid 1 mg IV LR bolus  BMP If pain medication is effective and the patient feels ok to go home Dr. Chestine Spore will plan to set patient up with out patient urology. If pain medication is not managed here in MAU we will admit patient for pain management with PCA and consult with Urology.  Patient  and family agreeable with plan.  Report given to Thressa Sheller CNM who resumes care of the patient.   Duane Lope, NP  2136: Patient reports some improvement with dilaudid. However, pain has returned at this time. She would like another dose of dilaudid now. She also feels like she cannot go home at this time.  2139: D/W Dr. Chestine Spore, she will put in admission orders and PCA orders at this time. Patient will need NST once per day.   2150: House coverage working to send someone to the unit to start PCA. Will give 1mg  of dilaudid now while the patient is waiting.   Assessment and Plan   Kidney Stone Admit to inpatient Once daily NST Urology consult tomorrow Dilaudid PCA for pain management

## 2015-06-22 NOTE — H&P (Signed)
29 y.o. G3P1011 @ 6823w1d  presenting to MAU with right lower back pain that radiates to abdomen. She has a h/o nephrolithiasis that started with her last pregnancy.  She has been seen multiple time this pregnancy w c/o kidney stones and one previous admission for pyelonephritis.  On 6/15, she had a renal US that showedmoderate right hydroureteronephrosis without definitive calculus.   On Saturday she passed a kidney stone and after that pain was much improved. On Sunday morning she started feeling worse; chills, shaking, and she could not get comfortable. She started flomax per recommendation of urology 1 week ago.  She had an outpatient renal US yesterday which showed persistent moderate right-sided hydronephrosis and hydroureter (mid and proximal).  She is able to urinate with no problem.  She is currently taking flomax, percocet and macrobid. She took medication this morning; and it helped some. She currently rates her pain 8/10.  Received 1 mg IV dilaudid in MAU w some improvement.    + fetal movement. No contraction Past Medical History  Diagnosis Date  . Depression   . Anxiety   . Psoriatic arthritis   . Arthritis   . Kidney stones     Past Surgical History  Procedure Laterality Date  . Tonsillectomy  2004  . Cholecystectomy  11/12/2011    Procedure: LAPAROSCOPIC CHOLECYSTECTOMY;  Surgeon: Atilano InaEric M Wilson, MD;  Location: [facility];  Service: General;  Laterality: N/A;    OB History  Gravida Para Term Preterm AB SAB TAB Ectopic Multiple Living  3 1 1  0 1 1 0 0 0 1    # Outcome Date GA Lbr Len/2nd Weight Sex Delivery Anes PTL Lv  3 Current           2 SAB           1 Term               History   Social History   Social History Main Topics  . Smoking status: Former Games developermoker  . Smokeless tobacco: Never Used  . Alcohol Use: No  . Drug Use: No  . Sexual Activity: Yes        Other Topics Concern  . Not on file   Social History Narrative   Shrimp    Prenatal Transfer Tool   Maternal Diabetes: No Genetic Screening: Declined Maternal Ultrasounds/Referrals: Normal Fetal Ultrasounds or other Referrals:  None Maternal Substance Abuse:  No Significant Maternal Medications:  Meds include: Other:  Percocet, Flomax Significant Maternal Lab Results: None  Other PNC: uncomplicated.    Filed Vitals:   06/22/15 1552  BP: 131/72  Pulse: 97  Temp: 98.7 F (37.1 C)  Resp: 20      Physical Exam per J Rasch NP Constitutional: She is oriented to person, place, and time. She appears well-developed and well-nourished.  Respiratory: Effort normal.  Musculoskeletal: Normal range of motion.  Neurological: She is alert and oriented to person, place, and time.  Skin: Skin is warm.  Back: significant right flank/back pain Psychiatric: Her behavior is normal.   SVE:  deferred FHTs:  130s, mod var, AGA Toco:  quiet   A/P   29 y.o. 2023w1d  G3P1011 presents with kidney stones Recurrent kidney stones: received IVF/ IV dilaudid in MAU w minimal improvement in pain.  Will admit to AP for PCA.  Given multiple recurrent episodes this pregnancy, will consult urology in AM for further recommendations re: management.  Cr 0.7 on admit.    Daily NST/FSR  currently reassuring    [facility] GEFFEL [employer]

## 2015-06-23 ENCOUNTER — Observation Stay (HOSPITAL_COMMUNITY): Payer: 59

## 2015-06-23 NOTE — Progress Notes (Signed)
NST done

## 2015-06-23 NOTE — Consult Note (Signed)
Urology Consult   Physician requesting consult: Marlow Baarsyanna Clark  Reason for consult: Right flank pain with hydroureteronephrosis  History of Present Illness: Brenda Webb is a 29 y.o. female G3P1011 at 26 weeks who was admitted for pain control due to right back pain that radiates to her flank and RLQ.  She states that her sx initially began on 06/01/15 and included N/V with chills.  U/S at the time revealed moderate right hydro with dilation of the distal right ureter without definitive calculus.  She was treated as an outpatient by her OB.  Per her report she has been on Macrobid for over a month and was started on Flomax and Percocet 2 weeks ago all of which were prescribed by OB.  This past Sat she passed a stone (she saw the stone and described it as small, jagged, and dark) and states that she immediately felt better.  However, Sunday all her sx returned and have persisted since then.  She describes the pain as severe and vomits when it is present.  She had a repeat U/S yesterday which revealed moderate right sided hydronephrosis with proximal ureteral dilation.  The distal right ureter was not visualized but bilateral ureteral jets were noted.  She was admitted and started on a Dilaudid PCA.  She has been voiding without difficulty.  She denies gross hematuria and dysuria.  UA was negative except for few bacteria and 3-6 RBCs.   She was also previously admitted on 04/23/15 for similar sx.  Renal U/S showed no hydro or stones. Per H/P the UA was clear.  Pt was admitted for pyelonephritis and started on IV Rocephin.  Urine culture was negative. She was inpatient for 2 days and then d/c'd home on 4 days of Ceftin and Dilaudid.  Pt did have a kidney stone with her first pregnancy in 2011 and was evaled by Dr. Vernie Ammonsttelin.  No intervention was necessary at that time.  She has also had stones since that time that she has been able to pass.  She has not been evaled by a urologist since 2011.  She states that when  she has issues she calls her PCP who starts her on an ABx and pain meds.    She is currently resting comfortably and states that her back pain is a dull ache.  She denies F/C, HA, SOB, N/V, and abdominal pain.      Past Medical History  Diagnosis Date  . Depression   . Anxiety   . Psoriatic arthritis   . Arthritis   . Kidney stones     Past Surgical History  Procedure Laterality Date  . Tonsillectomy  2004  . Cholecystectomy  11/12/2011    Procedure: LAPAROSCOPIC CHOLECYSTECTOMY;  Surgeon: Atilano InaEric M Wilson, MD;  Location: [facility];  Service: General;  Laterality: N/A;     Current Hospital Medications:  Home meds:    Medication List    ASK your doctor about these medications        acetaminophen 325 MG tablet  Commonly known as:  TYLENOL  Take 650 mg by mouth every 6 (six) hours as needed for moderate pain.     calcium carbonate 500 MG chewable tablet  Commonly known as:  TUMS - dosed in mg elemental calcium  Chew 2 tablets by mouth daily as needed for indigestion or heartburn.     nitrofurantoin 100 MG capsule  Commonly known as:  MACRODANTIN  Take 100 mg by mouth 2 (two) times daily.  ondansetron 8 MG disintegrating tablet  Commonly known as:  ZOFRAN ODT  Take 1 tablet (8 mg total) by mouth every 8 (eight) hours as needed for nausea or vomiting.     oxyCODONE-acetaminophen 5-325 MG per tablet  Commonly known as:  PERCOCET/ROXICET  Take 1 tablet by mouth every 4 (four) hours as needed for severe pain.     prenatal multivitamin Tabs tablet  Take 1 tablet by mouth daily at 12 noon.     tamsulosin 0.4 MG Caps capsule  Commonly known as:  FLOMAX  Take 0.4 mg by mouth daily.        Scheduled Meds: . docusate sodium  100 mg Oral Daily  . HYDROmorphone PCA 0.3 mg/mL   Intravenous 6 times per day  . nitrofurantoin (macrocrystal-monohydrate)  100 mg Oral QHS  . prenatal multivitamin  1 tablet Oral Q1200   Continuous Infusions: . lactated ringers 125 mL/hr at  06/23/15 0541   PRN Meds:.acetaminophen, calcium carbonate, promethazine  Allergies:  Allergies  Allergen Reactions  . Shrimp [Shellfish Allergy] Swelling    Family History  Problem Relation Age of Onset  . Cancer Paternal Grandfather     bladder    Social History:  reports that she has quit smoking. She has never used smokeless tobacco. She reports that she does not drink alcohol or use illicit drugs.  ROS: A complete review of systems was performed.  All systems are negative except for pertinent findings as noted.  Physical Exam:  Vital signs in last 24 hours: Temp:  [98 F (36.7 C)-98.9 F (37.2 C)] 98.9 F (37.2 C) (07/07 1000) Pulse Rate:  [68-115] 77 (07/07 1000) Resp:  [14-21] 16 (07/07 1000) BP: (123-135)/(65-80) 125/65 mmHg (07/07 1000) SpO2:  [95 %-100 %] 99 % (07/07 1000) Weight:  [85.288 kg (188 lb 0.4 oz)] 85.288 kg (188 lb 0.4 oz) (07/07 0544) Constitutional:  Alert and oriented, No acute distress Cardiovascular: Regular rate and rhythm Respiratory: Normal respiratory effort GI: Pregnant; no flank tenderness to palp GU: Right CVA tenderness; no left CVA tenderness Lymphatic: No lymphadenopathy Neurologic: Grossly intact, no focal deficits Psychiatric: Normal mood and affect  Laboratory Data:  Lab Results  Component Value Date   WBC 14.6* 05/27/2015   HGB 13.1 05/27/2015   HCT 38.1 05/27/2015   MCV 92.3 05/27/2015   PLT 213 05/27/2015     Recent Labs  06/22/15 1952  NA 136  K 3.6  CL 108  GLUCOSE 125*  BUN 8  CALCIUM 8.9  CREATININE 0.74     Results for orders placed or performed during the hospital encounter of 06/22/15 (from the past 24 hour(s))  Urinalysis, Routine w reflex microscopic (not at [facility])     Status: Abnormal   Collection Time: 06/22/15  3:55 PM  Result Value Ref Range   Color, Urine YELLOW YELLOW   APPearance CLEAR CLEAR   Specific Gravity, Urine <1.005 (L) 1.005 - 1.030   pH 6.5 5.0 - 8.0   Glucose, UA NEGATIVE  NEGATIVE mg/dL   Hgb urine dipstick SMALL (A) NEGATIVE   Bilirubin Urine NEGATIVE NEGATIVE   Ketones, ur NEGATIVE NEGATIVE mg/dL   Protein, ur NEGATIVE NEGATIVE mg/dL   Urobilinogen, UA 0.2 0.0 - 1.0 mg/dL   Nitrite NEGATIVE NEGATIVE   Leukocytes, UA SMALL (A) NEGATIVE  Urine microscopic-add on     Status: Abnormal   Collection Time: 06/22/15  3:55 PM  Result Value Ref Range   Squamous Epithelial / LPF FEW (A) RARE  WBC, UA 0-2 <3 WBC/hpf   RBC / HPF 3-6 <3 RBC/hpf   Bacteria, UA FEW (A) RARE  Basic metabolic panel     Status: Abnormal   Collection Time: 06/22/15  7:52 PM  Result Value Ref Range   Sodium 136 135 - 145 mmol/L   Potassium 3.6 3.5 - 5.1 mmol/L   Chloride 108 101 - 111 mmol/L   CO2 22 22 - 32 mmol/L   Glucose, Bld 125 (H) 65 - 99 mg/dL   BUN 8 6 - 20 mg/dL   Creatinine, Ser 1.61 0.44 - 1.00 mg/dL   Calcium 8.9 8.9 - 09.6 mg/dL   GFR calc non Af Amer >60 >60 mL/min   GFR calc Af Amer >60 >60 mL/min   Anion gap 6 5 - 15   No results found for this or any previous visit (from the past 240 hour(s)).  Renal Function:  Recent Labs  06/22/15 1952  CREATININE 0.74   Estimated Creatinine Clearance: 120.9 mL/min (by C-G formula based on Cr of 0.74).  Radiologic Imaging: US Renal  06/21/2015   ADDENDUM REPORT: 06/21/2015 15:17  ADDENDUM: The technologist performed additional images which did reveal a right ureteral jet. However, the distal right ureter itself could not be visualized and apparently is not dilated. Thus the obstruction in the right ureter is not total and the obstruction likely lies in the region of the junction of the mid and distal portions of the ureter.   Electronically Signed   By: Shikira Folino  [place] M.D.   On: 06/21/2015 15:17   06/21/2015   CLINICAL DATA:  Onset of right flank pain 2 days ago, history of urinary tract stones  EXAM: RENAL / URINARY TRACT ULTRASOUND COMPLETE  COMPARISON:  Renal ultrasound of June 01, 2015  FINDINGS: Right Kidney:  Length:  13.9 cm .There is moderate right-sided hydronephrosis similar to that observed on the previous study. There is dilation of the proximal right ureter to 1 point 4 cm no right ureteral jet is demonstrated.  Left Kidney:  Length: 12.2 cm. Echogenicity within normal limits. No mass or hydronephrosis visualized.  Bladder:  Appears normal for degree of bladder distention. A left ureteral jet is demonstrated.  IMPRESSION: 1. Moderate right-sided hydronephrosis and hydroureter of the proximal and mid right ureter. The distal ureter is not visible. There is no right ureteral jet. 2. The left kidney and urinary bladder are unremarkable.  Electronically Signed: By: Seirra Kos  [place] M.D. On: 06/21/2015 15:04    Impression/Recommendation: Right sided hydronephrosis with persistent pain and vomiting--It is unclear if the pt has a stone and if she does where it is located or whether or not she could pass it.  It would be in the pt's best interest to have a limited CT scan to answer these questions which would guide her care.  If she does have a small, passable stone lower in the track the pt could continue current care until stone is passed or potentially have a ureteroscopic removal.  If, however, the stone is high in the track or to large to pass then would need to consider cysto with stent placement vs perc nephrostomy tube.  I spoke with the radiologist who states it is appropriate for the pt to have a CT A/P without contrast to assess her.  He does not recommend MRI as they do not find stones well.  Will order CT and Dr. Isabel Caprice will review. He will then speak with the pt to make recommendations.  Recommended pt f/u with urology as an outpatient for acute stone issues as well as for eval to determine stone type and causes.    Addendum:  Stone protocol CT was performed. Moderate to severe right hydronephrosis noted. The dilated ureter extends to the gravid uterus. Her hydronephrosis appears to be based on extrinsic  compression. A ureteral calculus is not appreciated. There are no renal stones noted. The patient is feeling significantly better this afternoon. We discussed some positional changes no indication at this point for ureteral stent or nephrostomy tube.  DANCY, AMANDA 06/23/2015, 12:31 PM

## 2015-06-24 DIAGNOSIS — Z87442 Personal history of urinary calculi: Secondary | ICD-10-CM | POA: Diagnosis not present

## 2015-06-24 DIAGNOSIS — M545 Low back pain: Secondary | ICD-10-CM | POA: Diagnosis present

## 2015-06-24 DIAGNOSIS — N1330 Unspecified hydronephrosis: Secondary | ICD-10-CM | POA: Diagnosis present

## 2015-06-24 DIAGNOSIS — Z87891 Personal history of nicotine dependence: Secondary | ICD-10-CM | POA: Diagnosis not present

## 2015-06-24 DIAGNOSIS — O2302 Infections of kidney in pregnancy, second trimester: Secondary | ICD-10-CM | POA: Diagnosis present

## 2015-06-24 DIAGNOSIS — Z3A26 26 weeks gestation of pregnancy: Secondary | ICD-10-CM | POA: Diagnosis present

## 2015-06-24 MED ORDER — HYDROMORPHONE HCL 2 MG PO TABS
2.0000 mg | ORAL_TABLET | ORAL | Status: DC | PRN
  Administered 2015-06-24 – 2015-06-25 (×5): 2 mg via ORAL
  Filled 2015-06-24 (×5): qty 1

## 2015-06-24 MED ORDER — TAMSULOSIN HCL 0.4 MG PO CAPS
0.4000 mg | ORAL_CAPSULE | Freq: Every day | ORAL | Status: DC
  Administered 2015-06-24: 0.4 mg via ORAL
  Filled 2015-06-24: qty 1

## 2015-06-24 NOTE — Progress Notes (Signed)
Patient ID: Brenda Webb, female   DOB: 05/12/1986, 29 y.o.   MRN: [id number]  S: Pt continues to have right flank pain. Notes swelling over skin today on right flank and area tender to touch. Continues to urinate sediment O: Filed Vitals:   06/24/15 0530 06/24/15 0604 06/24/15 1000 06/24/15 1400  BP: 123/73  129/65 131/75  Pulse: 72  78 78  Temp: 98 F (36.7 C)  98.7 F (37.1 C) 98.2 F (36.8 C)  TempSrc: Oral  Oral Oral  Resp: 16  18 18   Height:      Weight:  87.544 kg (193 lb)    SpO2: 100%  97% 100%   AOX3, uncomfortable Slight erythema over right flank w/ edema + flank tenderness on right  A/P 1) Moderate/severe hydronephrosis w/o evidence of obstructing stone on CT. Pt continues to pass sediment in urine with persistent pain. Urology does not wish to place ureteral stent at this time. Will plan to attempt to control pain with oral dilaudid. Pt felt that pain was better controlled when she was taking tamsulosin. A prolonged course of this medication is reasonable. Will restart. 2) If patient can stay comfortable with oral pain medication will attempt to d/c home later this evening

## 2015-06-25 MED ORDER — TAMSULOSIN HCL 0.4 MG PO CAPS: 0.4000 mg | ORAL_CAPSULE | Freq: Every day | ORAL | Status: DC

## 2015-06-25 MED ORDER — HYDROMORPHONE HCL 2 MG PO TABS: ORAL_TABLET | ORAL | Status: DC

## 2015-06-25 NOTE — Discharge Summary (Signed)
Obstetric Discharge Summary Reason for Admission: Kidney stone, flank pain Prenatal Procedures: ultrasound Intrapartum Procedures: Undelivered Postpartum Procedures: NA Complications-Operative and Postpartum: NA HEMOGLOBIN  Date Value Ref Range Status  05/27/2015 13.1 12.0 - 15.0 g/dL Final   HCT  Date Value Ref Range Status  05/27/2015 38.1 36.0 - 46.0 % Final    Physical Exam:  General: alert, cooperative and appears stated age  Discharge Diagnoses: 26 week pregnancy, nephrolithiosis  Discharge Information: Date: 06/25/2015 Activity: unrestricted Diet: routine Medications: Dilaudid, flomax Condition: improved Instructions: refer to practice specific booklet Discharge to: home Follow-up Information    Follow up with Almon HerculesOSS,Lyberti Thrush H., MD In 2 weeks.   Specialty:  Obstetrics and Gynecology   Why:  For a prenatal visit   Contact information:   [address] [place] [address] [phone]       Brenda Willemsen H. 06/25/2015, 11:06 AM

## 2015-06-25 NOTE — Progress Notes (Signed)
Discharge teaching complete. Pt understood all instructions and did not have any questions. Pt ambulated out of the hospital and discharged home to family.  

## 2015-07-08 ENCOUNTER — Encounter (HOSPITAL_COMMUNITY): Payer: Self-pay | Admitting: *Deleted

## 2015-07-08 ENCOUNTER — Inpatient Hospital Stay (HOSPITAL_COMMUNITY): Payer: 59

## 2015-07-08 ENCOUNTER — Inpatient Hospital Stay (HOSPITAL_COMMUNITY)
Admission: AD | Admit: 2015-07-08 | Discharge: 2015-07-08 | Disposition: A | Discharge status: Home / Self Care | Payer: 59 | Source: Ambulatory Visit | Attending: Obstetrics and Gynecology | Admitting: Obstetrics and Gynecology

## 2015-07-08 DIAGNOSIS — O9989 Other specified diseases and conditions complicating pregnancy, childbirth and the puerperium: Secondary | ICD-10-CM | POA: Diagnosis not present

## 2015-07-08 DIAGNOSIS — N139 Obstructive and reflux uropathy, unspecified: Secondary | ICD-10-CM | POA: Diagnosis not present

## 2015-07-08 DIAGNOSIS — Z87442 Personal history of urinary calculi: Secondary | ICD-10-CM | POA: Diagnosis not present

## 2015-07-08 DIAGNOSIS — Z87891 Personal history of nicotine dependence: Secondary | ICD-10-CM | POA: Insufficient documentation

## 2015-07-08 DIAGNOSIS — O2302 Infections of kidney in pregnancy, second trimester: Secondary | ICD-10-CM

## 2015-07-08 DIAGNOSIS — N2889 Other specified disorders of kidney and ureter: Secondary | ICD-10-CM | POA: Diagnosis not present

## 2015-07-08 DIAGNOSIS — N200 Calculus of kidney: Secondary | ICD-10-CM

## 2015-07-08 DIAGNOSIS — R109 Unspecified abdominal pain: Secondary | ICD-10-CM | POA: Diagnosis present

## 2015-07-08 DIAGNOSIS — Z3A28 28 weeks gestation of pregnancy: Secondary | ICD-10-CM | POA: Diagnosis not present

## 2015-07-08 LAB — URINALYSIS, ROUTINE W REFLEX MICROSCOPIC
Bilirubin Urine: NEGATIVE
GLUCOSE, UA: NEGATIVE mg/dL
Ketones, ur: NEGATIVE mg/dL
LEUKOCYTES UA: NEGATIVE
NITRITE: NEGATIVE
PH: 6 (ref 5.0–8.0)
Protein, ur: NEGATIVE mg/dL
UROBILINOGEN UA: 0.2 mg/dL (ref 0.0–1.0)

## 2015-07-08 LAB — URINE MICROSCOPIC-ADD ON

## 2015-07-08 LAB — BASIC METABOLIC PANEL
Anion gap: 6 (ref 5–15)
BUN: 9 mg/dL (ref 6–20)
CO2: 21 mmol/L — ABNORMAL LOW (ref 22–32)
Calcium: 8.8 mg/dL — ABNORMAL LOW (ref 8.9–10.3)
Chloride: 108 mmol/L (ref 101–111)
Creatinine, Ser: 0.57 mg/dL (ref 0.44–1.00)
GFR calc Af Amer: >60 mL/min (ref >60)
GFR calc non Af Amer: >60 mL/min (ref >60)
Glucose, Bld: 77 mg/dL (ref 65–99)
Potassium: 3.7 mmol/L (ref 3.5–5.1)
Sodium: 135 mmol/L (ref 135–145)

## 2015-07-08 MED ORDER — OXYCODONE-ACETAMINOPHEN 7.5-325 MG PO TABS: 1.0000 | ORAL_TABLET | ORAL | Status: DC | PRN

## 2015-07-08 MED ORDER — OXYCODONE-ACETAMINOPHEN 5-325 MG PO TABS
2.0000 | ORAL_TABLET | Freq: Once | ORAL | Status: AC
  Administered 2015-07-08: 2 via ORAL
  Filled 2015-07-08: qty 2

## 2015-07-08 MED ORDER — SODIUM CHLORIDE 0.9 % IV BOLUS (SEPSIS)
1000.0000 mL | Freq: Once | INTRAVENOUS | Status: AC
  Administered 2015-07-08: 1000 mL via INTRAVENOUS

## 2015-07-08 NOTE — Discharge Instructions (Signed)

## 2015-07-08 NOTE — MAU Note (Signed)
Patient presents at [redacted] weeks gestation with c/o abdominal pain, vaginal bleeding and kidney issues. Fetus active. Denies discharge.

## 2015-07-08 NOTE — MAU Provider Note (Signed)
History     CSN: [id number]  Arrival date and time: 07/08/15 1740   None     Chief Complaint  Patient presents with  . Abdominal Pain  . Vaginal Bleeding  . kidney issues    HPI  Brenda Webb 29 y.o. [id number]  presents to the MAU with lower right and left back pain that has worsened over the course of the day. She noticed small amt of blood in the toilet earlier today. She has a history in this pregnancy of possible obstruction and has had kidney stones. States she stopped taking her flomax earlier this week and then started feeling worse. She states she is taking Procardia now. Pt was admitted for same complaint on 06/22/15  Past Medical History  Diagnosis Date  . Depression   . Anxiety   . Psoriatic arthritis   . Arthritis   . Kidney stones     Past Surgical History  Procedure Laterality Date  . Tonsillectomy  2004  . Cholecystectomy  11/12/2011    Procedure: LAPAROSCOPIC CHOLECYSTECTOMY;  Surgeon: Atilano Ina, MD;  Location: [facility];  Service: General;  Laterality: N/A;    Family History  Problem Relation Age of Onset  . Cancer Paternal Grandfather     bladder    History  Substance Use Topics  . Smoking status: Former Games developer  . Smokeless tobacco: Never Used  . Alcohol Use: No    Allergies:  Allergies  Allergen Reactions  . Shrimp [Shellfish Allergy] Swelling    Prescriptions prior to admission  Medication Sig Dispense Refill Last Dose  . acetaminophen (TYLENOL) 325 MG tablet Take 650 mg by mouth every 6 (six) hours as needed for moderate pain.   07/08/2015 at Unknown time  . NIFEdipine (PROCARDIA) 10 MG capsule Take 10 mg by mouth 3 (three) times daily.   07/08/2015 at Unknown time  . oxyCODONE-acetaminophen (PERCOCET/ROXICET) 5-325 MG per tablet Take 1 tablet by mouth every 6 (six) hours as needed for moderate pain.   0 07/08/2015 at Unknown time  . Prenatal Vit-Fe Fumarate-FA (PRENATAL MULTIVITAMIN) TABS tablet Take 1 tablet by mouth daily at 12  noon.   07/08/2015 at Unknown time  . promethazine (PHENERGAN) 25 MG tablet Take 1 tablet by mouth every 6 (six) hours as needed for nausea or vomiting.   0 Past Month at Unknown time  . tamsulosin (FLOMAX) 0.4 MG CAPS capsule Take 1 capsule (0.4 mg total) by mouth daily after supper. 30 capsule 1 Past Month at Unknown time  . HYDROmorphone (DILAUDID) 2 MG tablet Take 1-2 tablets every 4 hours as needed for pain (Patient taking differently: Take 2-4 mg by mouth every 4 (four) hours as needed for severe pain. ) 40 tablet 0 Not Taking  . ondansetron (ZOFRAN ODT) 8 MG disintegrating tablet Take 1 tablet (8 mg total) by mouth every 8 (eight) hours as needed for nausea or vomiting. (Patient not taking: Reported on 07/08/2015) 12 tablet 0 Not Taking at Unknown time   CLINICAL DATA: Right flank pain with vomiting. Right-sided hydronephrosis.  EXAM: CT ABDOMEN AND PELVIS WITHOUT CONTRAST  TECHNIQUE: Multidetector CT imaging of the abdomen and pelvis was performed following the standard protocol without oral or intravenous contrast material. Patient known to have third trimester intrauterine gestation ; Dr. Eppie Gibson spoke directly with the patient who signed consent for this study.  COMPARISON: Renal ultrasound June 21, 2015 ; CT abdomen and pelvis December 01, 2014 ; abdominal MRI January 31, 2015  FINDINGS: There is  slight atelectasis in the anterior left base. Lung bases are otherwise clear.  No focal liver lesions are identified on this noncontrast enhanced study. Gallbladder is absent. There is no biliary duct dilatation.  Spleen, pancreas, and adrenals appear normal. Left kidney shows no mass or hydronephrosis. There is no left-sided renal or ureteral calculus. The right kidney shows no evidence of mass. There is severe hydronephrosis on the right. There is diffuse dilatation of the right ureter to the level of the enlarged gravid uterus. The ureter narrows distally. There is no  renal or ureteral calculus apparent on the right.  Within the pelvis, there is a single gestation in vertex position with the fetal spine toward the maternal left. The placenta is posterior in location without demonstrable previa. No placental abruption. The urinary bladder is midline with normal wall thickness peer there is no pelvic mass or pelvic fluid collection. The appendix appears normal.  No bowel obstruction. No free air or portal venous air. There is no ascites, adenopathy, or abscess in the abdomen or pelvis. There is no abdominal aortic aneurysm. There are no blastic or lytic bone lesions.  IMPRESSION: No renal or ureteral calculi on either side. Severe hydronephrosis and ureterectasis on the right. Ureterectasis extends to the level of the gravid uterus for the uterus compresses upon the ureter. The distal most aspect of the right ureter does not appear distended. No evidence of perinephric stranding or evidence of renal inflammation/abscess.  Intrauterine gestation as discussed above. Placenta is posterior in location.  Gallbladder absent.  No bowel obstruction. No abscess. Appendix appears normal. Left kidney appears normal.   Electronically Signed  By: Bretta Bang III M.D.  On: 06/23/2015 14:42  CLINICAL DATA: Onset of right flank pain 2 days ago, history of urinary tract stones  EXAM: RENAL / URINARY TRACT ULTRASOUND COMPLETE  COMPARISON: Renal ultrasound of June 01, 2015  FINDINGS: Right Kidney:  Length: 13.9 cm .There is moderate right-sided hydronephrosis similar to that observed on the previous study. There is dilation of the proximal right ureter to 1 point 4 cm no right ureteral jet is demonstrated.  Left Kidney:  Length: 12.2 cm. Echogenicity within normal limits. No mass or hydronephrosis visualized.  Bladder:  Appears normal for degree of bladder distention. A left ureteral jet is  demonstrated.  IMPRESSION: 1. Moderate right-sided hydronephrosis and hydroureter of the proximal and mid right ureter. The distal ureter is not visible. There is no right ureteral jet. 2. The left kidney and urinary bladder are unremarkable.  Electronically Signed: By: David Swaziland M.D. On: 06/21/2015 15:04 Review of Systems  Constitutional: Negative for fever.  Genitourinary: Positive for hematuria and flank pain.  Musculoskeletal: Positive for back pain.  All other systems reviewed and are negative.  Physical Exam   Blood pressure 116/83, pulse 91, temperature 98.4 F (36.9 C), temperature source Oral, resp. rate 18, height 5\' 7"  (1.702 m), weight 85.503 kg (188 lb 8 oz), last menstrual period 12/21/2014.  Physical Exam  Nursing note and vitals reviewed. Constitutional: She is oriented to person, place, and time. She appears well-developed and well-nourished. No distress.  HENT:  Head: Normocephalic.  Neck: Normal range of motion.  Cardiovascular: Normal rate.   Respiratory: Effort normal. No respiratory distress.  GI: Soft. There is no tenderness.  Musculoskeletal: Normal range of motion.  Neurological: She is alert and oriented to person, place, and time.  Skin: Skin is warm and dry.  Psychiatric: She has a normal mood and affect. Her behavior is  normal. Judgment and thought content normal.  FHR- Cat 1 Uc's- none US Renal  07/08/2015   CLINICAL DATA:  29 year old G3 P1 SAB 1, currently approximately [redacted] weeks pregnant, with intermittent right lower quadrant abdominal pain and right flank pain over the past month, worsened in the past day or so. Known hydronephrosis. Prior history of urinary tract calculi.  EXAM: RENAL / URINARY TRACT ULTRASOUND COMPLETE  COMPARISON:  Urinary tract ultrasound 06/21/2015, 06/01/2015, 04/17/2015. Unenhanced CT abdomen and pelvis 06/23/2015. MRI pelvis 01/31/2015.  FINDINGS: Right Kidney:  Length: Approximately 14.5 cm. Marked hydronephrosis,  unchanged since the prior ultrasounds 06/21/2015 and 06/01/2015. No visible shadowing calculi. No focal parenchymal abnormality. Well-preserved cortex. Normal parenchymal echotexture.  Left Kidney:  Length: Approximately 12.4 cm. No hydronephrosis. Well-preserved cortex. No shadowing calculi. Normal parenchymal echotexture. No focal parenchymal abnormality.  Bladder:  Normal in appearance. Left ureteral jet visualized at color Doppler evaluation. No right ureteral jet visualized.  IMPRESSION: 1. Stable marked hydronephrosis involving the right kidney dating back to 06/01/2015. 2. No visible urinary tract calculi. 3. Normal-appearing left kidney. 4. Absent right ureteral jet in the bladder is consistent with obstruction.   Electronically Signed   By: Hulan Saas M.D.   On: 07/08/2015 21:56   Results for orders placed or performed during the hospital encounter of 07/08/15 (from the past 24 hour(s))  Urinalysis, Routine w reflex microscopic (not at [facility])     Status: Abnormal   Collection Time: 07/08/15  5:57 PM  Result Value Ref Range   Color, Urine YELLOW YELLOW   APPearance CLEAR CLEAR   Specific Gravity, Urine <1.005 (L) 1.005 - 1.030   pH 6.0 5.0 - 8.0   Glucose, UA NEGATIVE NEGATIVE mg/dL   Hgb urine dipstick MODERATE (A) NEGATIVE   Bilirubin Urine NEGATIVE NEGATIVE   Ketones, ur NEGATIVE NEGATIVE mg/dL   Protein, ur NEGATIVE NEGATIVE mg/dL   Urobilinogen, UA 0.2 0.0 - 1.0 mg/dL   Nitrite NEGATIVE NEGATIVE   Leukocytes, UA NEGATIVE NEGATIVE  Urine microscopic-add on     Status: None   Collection Time: 07/08/15  5:57 PM  Result Value Ref Range   Squamous Epithelial / LPF RARE RARE   WBC, UA 0-2 <3 WBC/hpf   RBC / HPF 3-6 <3 RBC/hpf   Bacteria, UA RARE RARE  Basic metabolic panel     Status: Abnormal   Collection Time: 07/08/15  8:15 PM  Result Value Ref Range   Sodium 135 135 - 145 mmol/L   Potassium 3.7 3.5 - 5.1 mmol/L   Chloride 108 101 - 111 mmol/L   CO2 21 (L) 22 - 32 mmol/L    Glucose, Bld 77 65 - 99 mg/dL   BUN 9 6 - 20 mg/dL   Creatinine, Ser 1.61 0.44 - 1.00 mg/dL   Calcium 8.8 (L) 8.9 - 10.3 mg/dL   GFR calc non Af Amer >60 >60 mL/min   GFR calc Af Amer >60 >60 mL/min   Anion gap 6 5 - 15   MAU Course  Procedures  MDM BMP, renal u/s ordered. Dr Claiborne Billings called and reviewed U/S results. Pt will be discharged to home with RX for Flomax and Percocet and is to follow up next week in the office. Pt States she has Flomax at home she can take  Assessment and Plan  Urinary Obstruction  RX:         Percocet        F/U next week in office  Clemmons,Lori Grissett 07/08/2015, 8:21  PM  

## 2015-08-22 ENCOUNTER — Inpatient Hospital Stay (HOSPITAL_COMMUNITY)
Admission: AD | Admit: 2015-08-22 | Discharge: 2015-08-24 | DRG: 782 | Disposition: A | Discharge status: Home / Self Care | Payer: 59 | Source: Ambulatory Visit | Attending: Obstetrics & Gynecology | Admitting: Obstetrics & Gynecology

## 2015-08-22 ENCOUNTER — Encounter (HOSPITAL_COMMUNITY): Payer: Self-pay | Admitting: *Deleted

## 2015-08-22 DIAGNOSIS — R109 Unspecified abdominal pain: Secondary | ICD-10-CM | POA: Diagnosis present

## 2015-08-22 DIAGNOSIS — O133 Gestational [pregnancy-induced] hypertension without significant proteinuria, third trimester: Secondary | ICD-10-CM

## 2015-08-22 DIAGNOSIS — Z3A35 35 weeks gestation of pregnancy: Secondary | ICD-10-CM | POA: Diagnosis present

## 2015-08-22 DIAGNOSIS — Z87442 Personal history of urinary calculi: Secondary | ICD-10-CM | POA: Diagnosis not present

## 2015-08-22 DIAGNOSIS — O139 Gestational [pregnancy-induced] hypertension without significant proteinuria, unspecified trimester: Secondary | ICD-10-CM | POA: Diagnosis present

## 2015-08-22 DIAGNOSIS — Z87891 Personal history of nicotine dependence: Secondary | ICD-10-CM

## 2015-08-22 LAB — URINE MICROSCOPIC-ADD ON

## 2015-08-22 LAB — URINALYSIS, ROUTINE W REFLEX MICROSCOPIC
Bilirubin Urine: NEGATIVE
GLUCOSE, UA: NEGATIVE mg/dL
Ketones, ur: NEGATIVE mg/dL
LEUKOCYTES UA: NEGATIVE
Nitrite: NEGATIVE
PH: 6.5 (ref 5.0–8.0)
Protein, ur: NEGATIVE mg/dL
Specific Gravity, Urine: <1.005 — ABNORMAL LOW (ref 1.005–1.030)
Urobilinogen, UA: 0.2 mg/dL (ref 0.0–1.0)

## 2015-08-22 LAB — CBC
HCT: 33.1 % — ABNORMAL LOW (ref 36.0–46.0)
HEMOGLOBIN: 11 g/dL — AB (ref 12.0–15.0)
MCH: 30.5 pg (ref 26.0–34.0)
MCHC: 33.2 g/dL (ref 30.0–36.0)
MCV: 91.7 fL (ref 78.0–100.0)
PLATELETS: 191 10*3/uL (ref 150–400)
RBC: 3.61 MIL/uL — AB (ref 3.87–5.11)
RDW: 13.8 % (ref 11.5–15.5)
WBC: 17.8 10*3/uL — ABNORMAL HIGH (ref 4.0–10.5)

## 2015-08-22 LAB — URIC ACID: Uric Acid, Serum: 5 mg/dL (ref 2.3–6.6)

## 2015-08-22 LAB — COMPREHENSIVE METABOLIC PANEL
ALBUMIN: 2.9 g/dL — AB (ref 3.5–5.0)
ALT: 12 U/L — AB (ref 14–54)
ANION GAP: 12 (ref 5–15)
AST: 20 U/L (ref 15–41)
Alkaline Phosphatase: 132 U/L — ABNORMAL HIGH (ref 38–126)
BUN: 9 mg/dL (ref 6–20)
CHLORIDE: 103 mmol/L (ref 101–111)
CO2: 20 mmol/L — AB (ref 22–32)
Calcium: 9 mg/dL (ref 8.9–10.3)
Creatinine, Ser: 0.6 mg/dL (ref 0.44–1.00)
GFR calc non Af Amer: >60 mL/min (ref >60)
GLUCOSE: 94 mg/dL (ref 65–99)
Potassium: 3.7 mmol/L (ref 3.5–5.1)
SODIUM: 135 mmol/L (ref 135–145)
Total Bilirubin: 0.5 mg/dL (ref 0.3–1.2)
Total Protein: 6.1 g/dL — ABNORMAL LOW (ref 6.5–8.1)

## 2015-08-22 LAB — LACTATE DEHYDROGENASE: LDH: 141 U/L (ref 98–192)

## 2015-08-22 LAB — OB RESULTS CONSOLE GBS: GBS: NEGATIVE

## 2015-08-22 LAB — PROTEIN / CREATININE RATIO, URINE
CREATININE, URINE: 27 mg/dL
Total Protein, Urine: <6 mg/dL

## 2015-08-22 MED ORDER — LABETALOL HCL 100 MG PO TABS
100.0000 mg | ORAL_TABLET | Freq: Once | ORAL | Status: AC
  Administered 2015-08-22: 100 mg via ORAL
  Filled 2015-08-22: qty 1

## 2015-08-22 MED ORDER — BETAMETHASONE SOD PHOS & ACET 6 (3-3) MG/ML IJ SUSP
12.0000 mg | Freq: Once | INTRAMUSCULAR | Status: AC
  Administered 2015-08-23: 12 mg via INTRAMUSCULAR
  Filled 2015-08-22: qty 2

## 2015-08-22 MED ORDER — HYDROCODONE-ACETAMINOPHEN 5-325 MG PO TABS
1.0000 | ORAL_TABLET | Freq: Once | ORAL | Status: AC
  Administered 2015-08-22: 1 via ORAL
  Filled 2015-08-22: qty 1

## 2015-08-22 MED ORDER — HYDRALAZINE HCL 20 MG/ML IJ SOLN: 10.0000 mg | Freq: Once | INTRAMUSCULAR | Status: DC | PRN

## 2015-08-22 MED ORDER — NIFEDIPINE 10 MG PO CAPS
10.0000 mg | ORAL_CAPSULE | Freq: Once | ORAL | Status: AC
  Administered 2015-08-22: 10 mg via ORAL
  Filled 2015-08-22: qty 1

## 2015-08-22 MED ORDER — LABETALOL HCL 5 MG/ML IV SOLN: 20.0000 mg | INTRAVENOUS | Status: DC | PRN

## 2015-08-22 MED ORDER — BUTORPHANOL TARTRATE 1 MG/ML IJ SOLN
1.0000 mg | INTRAMUSCULAR | Status: DC | PRN
  Administered 2015-08-22 – 2015-08-24 (×9): 1 mg via INTRAVENOUS
  Filled 2015-08-22 (×9): qty 1

## 2015-08-22 MED ORDER — ZOLPIDEM TARTRATE 5 MG PO TABS
5.0000 mg | ORAL_TABLET | Freq: Every evening | ORAL | Status: DC | PRN
  Administered 2015-08-23 (×2): 5 mg via ORAL
  Filled 2015-08-22 (×2): qty 1

## 2015-08-22 NOTE — MAU Note (Signed)
Low back pain and pressure started on Saturday.  Started cramping yesterday, feeling pelvic pressure.  Has continued off and on .

## 2015-08-22 NOTE — H&P (Signed)
29 y.o. [redacted]w[redacted]d G4P1011 comes in c/o cramping pain but found to have elevated blood pressures.  Otherwise has good fetal movement and no bleeding.  Denied HA or other signs of preeclampsia initially- now has mild HA but only since she was given Procardia. She has some flank pain but feels that the pain that is cramping is different.  Pt states she was delivered at 37 weeks last pregnancy for PIH.  Past Medical History  Diagnosis Date  . Depression   . Anxiety   . Psoriatic arthritis   . Arthritis   . Kidney stones     Past Surgical History  Procedure Laterality Date  . Tonsillectomy  2004  . Cholecystectomy  11/12/2011    Procedure: LAPAROSCOPIC CHOLECYSTECTOMY;  Surgeon: EGayland Curry MD;  Location: [facility];  Service: General;  Laterality: N/A;    OB History  Gravida Para Term Preterm AB SAB TAB Ectopic Multiple Living  4 1 1  0 1 1 0 0 0 1    # Outcome Date GA Lbr Len/2nd Weight Sex Delivery Anes PTL Lv  4 Current           3 Gravida           2 SAB           1 Term               Social History   Social History  . Marital Status: Married    Spouse Name: N/A  . Number of Children: N/A  . Years of Education: N/A   Occupational History  . Not on file.   Social History Main Topics  . Smoking status: Former SResearch scientist (life sciences) . Smokeless tobacco: Never Used  . Alcohol Use: No  . Drug Use: No  . Sexual Activity: Yes    Birth Control/ Protection: None     Comment: mirena iud   Other Topics Concern  . Not on file   Social History Narrative   Shrimp    Prenatal Transfer Tool  Maternal Diabetes: No Genetic Screening: Normal Maternal Ultrasounds/Referrals: Normal Fetal Ultrasounds or other Referrals:  None Maternal Substance Abuse:  No Significant Maternal Medications:  Meds include: Other: Flomax for recurrrent kidney stones.  Pt (not fetus) has had mild R hydronephrosis. Significant Maternal Lab Results: None  Other PNC: uncomplicated.    Filed Vitals:   08/22/15 1808  08/22/15 1820 08/22/15 1950 08/22/15 2010  BP: 153/100 136/76 140/98   Pulse:   98   Temp:   97.8 F (36.6 C)   TempSrc:   Axillary   Resp:   20   Height:    5' 7"  (1.702 m)  Weight:    92.534 kg (204 lb)     Lungs/Cor:  NAD Abdomen:  soft, gravid Ex:  no cords, erythema SVE:  0/thick/high per NP. FHTs:  120, good STV, NST R Toco:  q irritability  Results for orders placed or performed during the hospital encounter of 08/22/15 (from the past 24 hour(s))  Protein / creatinine ratio, urine     Status: None   Collection Time: 08/22/15  2:50 PM  Result Value Ref Range   Creatinine, Urine 27.00 mg/dL   Total Protein, Urine <6 mg/dL   Protein Creatinine Ratio        0.00 - 0.15 mg/mg[Cre]  Urinalysis, [facility] reflex microscopic [facility]     Status: Abnormal   Collection Time: 08/22/15  2:58 PM  Result Value Ref Range  Color, Urine YELLOW YELLOW   APPearance CLEAR CLEAR   Specific Gravity, Urine <1.005 (L) 1.005 - 1.030   pH 6.5 5.0 - 8.0   Glucose, UA NEGATIVE NEGATIVE mg/dL   Hgb urine dipstick LARGE (A) NEGATIVE   Bilirubin Urine NEGATIVE NEGATIVE   Ketones, ur NEGATIVE NEGATIVE mg/dL   Protein, ur NEGATIVE NEGATIVE mg/dL   Urobilinogen, UA 0.2 0.0 - 1.0 mg/dL   Nitrite NEGATIVE NEGATIVE   Leukocytes, UA NEGATIVE NEGATIVE  Urine microscopic-add on     Status: None   Collection Time: 08/22/15  2:58 PM  Result Value Ref Range   Squamous Epithelial / LPF RARE RARE   WBC, UA 0-2 <3 WBC/hpf   RBC / HPF 3-6 <3 RBC/hpf   Bacteria, UA RARE RARE  CBC     Status: Abnormal   Collection Time: 08/22/15  4:32 PM  Result Value Ref Range   WBC 17.8 (H) 4.0 - 10.5 K/uL   RBC 3.61 (L) 3.87 - 5.11 MIL/uL   Hemoglobin 11.0 (L) 12.0 - 15.0 g/dL   HCT 33.1 (L) 36.0 - 46.0 %   MCV 91.7 78.0 - 100.0 fL   MCH 30.5 26.0 - 34.0 pg   MCHC 33.2 30.0 - 36.0 g/dL   RDW 13.8 11.5 - 15.5 %   Platelets 191 150 - 400 K/uL  Comprehensive metabolic panel     Status: Abnormal   Collection  Time: 08/22/15  4:32 PM  Result Value Ref Range   Sodium 135 135 - 145 mmol/L   Potassium 3.7 3.5 - 5.1 mmol/L   Chloride 103 101 - 111 mmol/L   CO2 20 (L) 22 - 32 mmol/L   Glucose, Bld 94 65 - 99 mg/dL   BUN 9 6 - 20 mg/dL   Creatinine, Ser 0.60 0.44 - 1.00 mg/dL   Calcium 9.0 8.9 - 10.3 mg/dL   Total Protein 6.1 (L) 6.5 - 8.1 g/dL   Albumin 2.9 (L) 3.5 - 5.0 g/dL   AST 20 15 - 41 U/L   ALT 12 (L) 14 - 54 U/L   Alkaline Phosphatase 132 (H) 38 - 126 U/L   Total Bilirubin 0.5 0.3 - 1.2 mg/dL   GFR calc non Af Amer >60 >60 mL/min   GFR calc Af Amer >60 >60 mL/min   Anion gap 12 5 - 15  Uric acid     Status: None   Collection Time: 08/22/15  4:32 PM  Result Value Ref Range   Uric Acid, Serum 5.0 2.3 - 6.6 mg/dL  Lactate dehydrogenase     Status: None   Collection Time: 08/22/15  4:32 PM  Result Value Ref Range   LDH 141 98 - 192 U/L     A/P   Preterm PIH with no current signs of preeclampsia (except for HA but may be related to medication.)  Here for observation for BPs-she has not met criteria for Hypertensive Protocol.  24 hour urine started.  BMZ and GBS ordered.  VTX by bedside u/s.   Shalane Florendo A

## 2015-08-22 NOTE — MAU Provider Note (Signed)
History     CSN: [id number]  Arrival date and time: 08/22/15 1409   First Provider Initiated Contact with Patient 08/22/15 1550      Chief Complaint  Patient presents with  . Back Pain  . Contractions   HPI   Ms.Brenda Webb is a 29 y.o. female G4P1011 at 48w1dpresenting to MAU with back pain/ pressure, contraction like pain and vaginal pressure.   She contraction like pain is described as tightening in her upper abdomen like a strong menstrual cramp. Symptoms started a few days ago. She has not taken anything for the pain and currently rates her pain 6/10.   + fetal movements Denies vaginal bleeding Denies leaking of fluid.   No intercourse in the last 24 hours.    OB History    Gravida Para Term Preterm AB TAB SAB Ectopic Multiple Living   _0 0 1 0 1 0 0 1      Past Medical History  Diagnosis Date  . Depression   . Anxiety   . Psoriatic arthritis   . Arthritis   . Kidney stones     Past Surgical History  Procedure Laterality Date  . Tonsillectomy  2004  . Cholecystectomy  11/12/2011    Procedure: LAPAROSCOPIC CHOLECYSTECTOMY;  Surgeon: EGayland Curry MD;  Location: [facility];  Service: General;  Laterality: N/A;    Family History  Problem Relation Age of Onset  . Cancer Paternal Grandfather     bladder    Social History  Substance Use Topics  . Smoking status: Former SResearch scientist (life sciences) . Smokeless tobacco: Never Used  . Alcohol Use: No    Allergies:  Allergies  Allergen Reactions  . Shrimp [Shellfish Allergy] Swelling    Prescriptions prior to admission  Medication Sig Dispense Refill Last Dose  . NIFEdipine (PROCARDIA) 10 MG capsule Take 10 mg by mouth 3 (three) times daily.   Past Month at Unknown time  . nitrofurantoin, macrocrystal-monohydrate, (MACROBID) 100 MG capsule Take 100 mg by mouth 2 (two) times daily.   08/22/2015 at Unknown time  . oxyCODONE-acetaminophen (PERCOCET) 7.5-325 MG per tablet Take 1 tablet by mouth every 4 (four) hours as  needed for severe pain. 30 tablet 0 Past Month at Unknown time  . tamsulosin (FLOMAX) 0.4 MG CAPS capsule Take 0.4 mg by mouth.   08/22/2015 at Unknown time  . acetaminophen (TYLENOL) 325 MG tablet Take 650 mg by mouth every 6 (six) hours as needed for moderate pain.   More than a month at Unknown time  . Prenatal Vit-Fe Fumarate-FA (PRENATAL MULTIVITAMIN) TABS tablet Take 1 tablet by mouth daily at 12 noon.   07/08/2015 at Unknown time  . promethazine (PHENERGAN) 25 MG tablet Take 1 tablet by mouth every 6 (six) hours as needed for nausea or vomiting.   0 Past Month at Unknown time   Results for orders placed or performed during the hospital encounter of 08/22/15 (from the past 48 hour(s))  Protein / creatinine ratio, urine     Status: None   Collection Time: 08/22/15  2:50 PM  Result Value Ref Range   Creatinine, Urine 27.00 mg/dL   Total Protein, Urine <6 mg/dL    Comment: NO NORMAL RANGE ESTABLISHED FOR THIS TEST REPEATED TO VERIFY    Protein Creatinine Ratio        0.00 - 0.15 mg/mg[Cre]    Comment: RESULT BELOW REPORTABLE RANGE, UNABLE TO CALCULATE.   Urinal[facility], [facility] reflex [facility]  Status: Abnormal   Collection Time: 08/22/15  2:58 PM  Result Value Ref Range   Color, Urine YELLOW YELLOW   APPearance CLEAR CLEAR   Specific Gravity, Urine <1.005 (L) 1.005 - 1.030   pH 6.5 5.0 - 8.0   Glucose, UA NEGATIVE NEGATIVE mg/dL   Hgb urine dipstick LARGE (A) NEGATIVE   Bilirubin Urine NEGATIVE NEGATIVE   Ketones, ur NEGATIVE NEGATIVE mg/dL   Protein, ur NEGATIVE NEGATIVE mg/dL   Urobilinogen, UA 0.2 0.0 - 1.0 mg/dL   Nitrite NEGATIVE NEGATIVE   Leukocytes, UA NEGATIVE NEGATIVE  Urine microscopic-add on     Status: None   Collection Time: 08/22/15  2:58 PM  Result Value Ref Range   Squamous Epithelial / LPF RARE RARE   WBC, UA 0-2 <3 WBC/hpf   RBC / HPF 3-6 <3 RBC/hpf   Bacteria, UA RARE RARE  CBC     Status: Abnormal   Collection Time: 08/22/15  4:32 PM   Result Value Ref Range   WBC 17.8 (H) 4.0 - 10.5 K/uL   RBC 3.61 (L) 3.87 - 5.11 MIL/uL   Hemoglobin 11.0 (L) 12.0 - 15.0 g/dL   HCT 33.1 (L) 36.0 - 46.0 %   MCV 91.7 78.0 - 100.0 fL   MCH 30.5 26.0 - 34.0 pg   MCHC 33.2 30.0 - 36.0 g/dL   RDW 13.8 11.5 - 15.5 %   Platelets 191 150 - 400 K/uL  Comprehensive metabolic panel     Status: Abnormal   Collection Time: 08/22/15  4:32 PM  Result Value Ref Range   Sodium 135 135 - 145 mmol/L   Potassium 3.7 3.5 - 5.1 mmol/L   Chloride 103 101 - 111 mmol/L   CO2 20 (L) 22 - 32 mmol/L   Glucose, Bld 94 65 - 99 mg/dL   BUN 9 6 - 20 mg/dL   Creatinine, Ser 0.60 0.44 - 1.00 mg/dL   Calcium 9.0 8.9 - 10.3 mg/dL   Total Protein 6.1 (L) 6.5 - 8.1 g/dL   Albumin 2.9 (L) 3.5 - 5.0 g/dL   AST 20 15 - 41 U/L   ALT 12 (L) 14 - 54 U/L   Alkaline Phosphatase 132 (H) 38 - 126 U/L   Total Bilirubin 0.5 0.3 - 1.2 mg/dL   GFR calc non Af Amer >60 >60 mL/min   GFR calc Af Amer >60 >60 mL/min    Comment: (NOTE) The eGFR has been calculated using the CKD EPI equation. This calculation has not been validated in all clinical situations. eGFR's persistently <60 mL/min signify possible Chronic Kidney Disease.    Anion gap 12 5 - 15  Uric acid     Status: None   Collection Time: 08/22/15  4:32 PM  Result Value Ref Range   Uric Acid, Serum 5.0 2.3 - 6.6 mg/dL  Lactate dehydrogenase     Status: None   Collection Time: 08/22/15  4:32 PM  Result Value Ref Range   LDH 141 98 - 192 U/L     Review of Systems  Constitutional: Negative for fever.  Eyes: Negative for blurred vision.  Respiratory: Negative for shortness of breath.   Gastrointestinal: Positive for nausea and abdominal pain. Negative for vomiting.  Genitourinary: Negative for dysuria.  Neurological: Positive for headaches.   Physical Exam   Blood pressure 153/94, pulse 97, temperature 98.4 F (36.9 C), temperature source Oral, resp. rate 20, last menstrual period 12/21/2014, unknown if  currently breastfeeding.   Patient Vitals for  the past 24 hrs:  BP Temp Temp src Pulse Resp  08/22/15 1820 136/76 mmHg - - - -  08/22/15 1808 153/100 mmHg - - - -  08/22/15 1736 (!) 152/103 mmHg - - 100 -  08/22/15 1633 139/88 mmHg - - - -  08/22/15 1600 144/77 mmHg - - - -  08/22/15 1539 153/94 mmHg - - 97 -  08/22/15 1516 142/91 mmHg - - - -  08/22/15 1511 158/88 mmHg - - - -  08/22/15 1447 142/81 mmHg 98.4 F (36.9 C) Oral 103 20    Physical Exam  Constitutional: She is oriented to person, place, and time. She appears well-developed and well-nourished. No distress.  HENT:  Head: Normocephalic.  Eyes: Pupils are equal, round, and reactive to light.  Neck: Neck supple.  Respiratory: Effort normal.  GI: Soft. There is tenderness in the right upper quadrant. There is no rigidity, no rebound and no guarding.  Genitourinary:  Dilation: Closed Effacement (%): Thick Cervical Position: Anterior Exam by:: Lenna Sciara Ibrahim Mcpheeters NP  Musculoskeletal: Normal range of motion. She exhibits no edema.  Neurological: She is alert and oriented to person, place, and time. She has normal reflexes. She displays normal reflexes.  Negative clonus   Skin: She is not diaphoretic.  Psychiatric: Her behavior is normal.     Fetal Tracing: Baseline: 125 bpm  Variability: moderate  Accelerations: 15x15 Decelerations: none Toco: none  MAU Course  Procedures  None  MDM  Procardia Vicodin X1 PIH labs  Discussed labs and vitals with Dr. Philis Pique will admit for observation   Assessment and Plan   A:  1. Pregnancy induced hypertension, third trimester    P:  Admit to [facility] per Dr. Dorita Sciara I Jacob Chamblee, NP 08/22/2015 7:09 PM

## 2015-08-23 DIAGNOSIS — O139 Gestational [pregnancy-induced] hypertension without significant proteinuria, unspecified trimester: Secondary | ICD-10-CM | POA: Diagnosis present

## 2015-08-23 LAB — GROUP B STREP BY PCR: GROUP B STREP BY PCR: NEGATIVE

## 2015-08-23 LAB — PROTEIN, URINE, 24 HOUR
COLLECTION INTERVAL-UPROT: 24 h
Urine Total Volume-UPROT: 4500 mL

## 2015-08-23 MED ORDER — LACTATED RINGERS IV SOLN
INTRAVENOUS | Status: DC
  Administered 2015-08-23 – 2015-08-24 (×4): via INTRAVENOUS

## 2015-08-23 MED ORDER — ACETAMINOPHEN 325 MG PO TABS
650.0000 mg | ORAL_TABLET | ORAL | Status: DC | PRN
  Administered 2015-08-23 – 2015-08-24 (×2): 650 mg via ORAL
  Filled 2015-08-23 (×2): qty 2

## 2015-08-23 NOTE — Progress Notes (Signed)
Name: Brenda Webb Medical Record Number:  [id number] Date of Birth: 1985/12/28 Date of Service: 08/23/2015  29 y.o. G4P1011 [redacted]w[redacted]d HD#1 admitted for gestational hypertension. She also c/o contractions.  Pt denies ctx, she has some right flank pain. She denies vaginal bleeding, no leaking of fluid. Reports good FM.  She denies HA today (had a HA yesterday), no blurry vision, no RUQ pain.   The patient's past medical history and prenatal records were reviewed.  Additional issues addressed and updated today: Patient Active Problem List   Diagnosis Date Noted  . Gestational hypertension, antepartum 08/23/2015  . Pregnancy with nephrolithiasis in second trimester 06/22/2015  . Pyelonephritis affecting pregnancy in second trimester, antepartum 04/21/2015   Family History  Problem Relation Age of Onset  . Cancer Paternal Grandfather     bladder   Social History   Social History  . Marital Status: Married    Spouse Name: N/A  . Number of Children: N/A  . Years of Education: N/A   Social History Main Topics  . Smoking status: Former Games developer  . Smokeless tobacco: Never Used  . Alcohol Use: No  . Drug Use: No  . Sexual Activity: Yes    Birth Control/ Protection: None     Comment: mirena iud   Other Topics Concern  . None   Social History Narrative   Filed Vitals:   08/23/15 0834  BP: 124/79  Pulse: 100  Temp: 97.7 F (36.5 C)  Resp: 18     Physical Examination:   Filed Vitals:   08/23/15 0834  BP: 124/79  Pulse: 100  Temp: 97.7 F (36.5 C)  Resp: 18   General appearance - alert, well appearing, and in no distress Mental status - alert, oriented to person, place, and time Abd  Soft, gravid, nontender Ex SCDs FHTs  130s, moderate variability accels no decels Toco  irritability  Cervix: not evaluated  Results for orders placed or performed during the hospital encounter of 08/22/15 (from the past 24 hour(s))  Protein / creatinine ratio, urine     Status: None    Collection Time: 08/22/15  2:50 PM  Result Value Ref Range   Creatinine, Urine 27.00 mg/dL   Total Protein, Urine <6 mg/dL   Protein Creatinine Ratio        0.00 - 0.15 mg/mg[Cre]  Urinalysis, Routine w reflex microscopic (not at [facility])     Status: Abnormal   Collection Time: 08/22/15  2:58 PM  Result Value Ref Range   Color, Urine YELLOW YELLOW   APPearance CLEAR CLEAR   Specific Gravity, Urine <1.005 (L) 1.005 - 1.030   pH 6.5 5.0 - 8.0   Glucose, UA NEGATIVE NEGATIVE mg/dL   Hgb urine dipstick LARGE (A) NEGATIVE   Bilirubin Urine NEGATIVE NEGATIVE   Ketones, ur NEGATIVE NEGATIVE mg/dL   Protein, ur NEGATIVE NEGATIVE mg/dL   Urobilinogen, UA 0.2 0.0 - 1.0 mg/dL   Nitrite NEGATIVE NEGATIVE   Leukocytes, UA NEGATIVE NEGATIVE  Urine microscopic-add on     Status: None   Collection Time: 08/22/15  2:58 PM  Result Value Ref Range   Squamous Epithelial / LPF RARE RARE   WBC, UA 0-2 <3 WBC/hpf   RBC / HPF 3-6 <3 RBC/hpf   Bacteria, UA RARE RARE  CBC     Status: Abnormal   Collection Time: 08/22/15  4:32 PM  Result Value Ref Range   WBC 17.8 (H) 4.0 - 10.5 K/uL   RBC 3.61 (L) 3.87 - 5.11  MIL/uL   Hemoglobin 11.0 (L) 12.0 - 15.0 g/dL   HCT 16.1 (L) 09.6 - 04.5 %   MCV 91.7 78.0 - 100.0 fL   MCH 30.5 26.0 - 34.0 pg   MCHC 33.2 30.0 - 36.0 g/dL   RDW 40.9 81.1 - 91.4 %   Platelets 191 150 - 400 K/uL  Comprehensive metabolic panel     Status: Abnormal   Collection Time: 08/22/15  4:32 PM  Result Value Ref Range   Sodium 135 135 - 145 mmol/L   Potassium 3.7 3.5 - 5.1 mmol/L   Chloride 103 101 - 111 mmol/L   CO2 20 (L) 22 - 32 mmol/L   Glucose, Bld 94 65 - 99 mg/dL   BUN 9 6 - 20 mg/dL   Creatinine, Ser 7.82 0.44 - 1.00 mg/dL   Calcium 9.0 8.9 - 95.6 mg/dL   Total Protein 6.1 (L) 6.5 - 8.1 g/dL   Albumin 2.9 (L) 3.5 - 5.0 g/dL   AST 20 15 - 41 U/L   ALT 12 (L) 14 - 54 U/L   Alkaline Phosphatase 132 (H) 38 - 126 U/L   Total Bilirubin 0.5 0.3 - 1.2 mg/dL   GFR calc non Af  Amer >60 >60 mL/min   GFR calc Af Amer >60 >60 mL/min   Anion gap 12 5 - 15  Uric acid     Status: None   Collection Time: 08/22/15  4:32 PM  Result Value Ref Range   Uric Acid, Serum 5.0 2.3 - 6.6 mg/dL  Lactate dehydrogenase     Status: None   Collection Time: 08/22/15  4:32 PM  Result Value Ref Range   LDH 141 98 - 192 U/L  Group B strep by PCR     Status: None   Collection Time: 08/22/15 11:45 PM  Result Value Ref Range   Group B strep by PCR NEGATIVE NEGATIVE    A:  HD#1  [redacted]w[redacted]d with gestational hypertension. Blood pressures are labile SBP 153 overnight, currently normal range.  Pre E labs were within normal limits.   P: Continue to watch patient in antepartum, second dose of BMTZ tonight at 2200.  She also is collecting a 24 hr urine protein.  Will consider delivery if patient shows neurologic symptoms and BPs are severe range.  Otherwise will discharge home with close follow up in office, deliver once term.   Keimon Basaldua STACIA

## 2015-08-23 NOTE — Plan of Care (Signed)
Problem: Consults Goal: Birthing Suites Patient Information Press F2 to bring up selections list Outcome: Completed/Met Date Met:  08/23/15  Pt < [redacted] weeks EGA and PIH (Pregnancy induced hypertension)

## 2015-08-24 LAB — COMPREHENSIVE METABOLIC PANEL
ALT: 13 U/L — ABNORMAL LOW (ref 14–54)
ANION GAP: 10 (ref 5–15)
AST: 21 U/L (ref 15–41)
Albumin: 2.7 g/dL — ABNORMAL LOW (ref 3.5–5.0)
Alkaline Phosphatase: 128 U/L — ABNORMAL HIGH (ref 38–126)
BILIRUBIN TOTAL: 0.3 mg/dL (ref 0.3–1.2)
BUN: 6 mg/dL (ref 6–20)
CHLORIDE: 108 mmol/L (ref 101–111)
CO2: 19 mmol/L — ABNORMAL LOW (ref 22–32)
Calcium: 8.6 mg/dL — ABNORMAL LOW (ref 8.9–10.3)
Creatinine, Ser: 0.61 mg/dL (ref 0.44–1.00)
Glucose, Bld: 87 mg/dL (ref 65–99)
POTASSIUM: 3.2 mmol/L — AB (ref 3.5–5.1)
Sodium: 137 mmol/L (ref 135–145)
TOTAL PROTEIN: 5.9 g/dL — AB (ref 6.5–8.1)

## 2015-08-24 LAB — CBC
HEMATOCRIT: 30.4 % — AB (ref 36.0–46.0)
Hemoglobin: 9.8 g/dL — ABNORMAL LOW (ref 12.0–15.0)
MCH: 30.2 pg (ref 26.0–34.0)
MCHC: 32.2 g/dL (ref 30.0–36.0)
MCV: 93.8 fL (ref 78.0–100.0)
PLATELETS: 197 10*3/uL (ref 150–400)
RBC: 3.24 MIL/uL — ABNORMAL LOW (ref 3.87–5.11)
RDW: 14.2 % (ref 11.5–15.5)
WBC: 21.1 10*3/uL — ABNORMAL HIGH (ref 4.0–10.5)

## 2015-08-24 MED ORDER — BETAMETHASONE SOD PHOS & ACET 6 (3-3) MG/ML IJ SUSP
12.0000 mg | Freq: Once | INTRAMUSCULAR | Status: AC
  Administered 2015-08-24: 12 mg via INTRAMUSCULAR
  Filled 2015-08-24: qty 2

## 2015-08-24 MED ORDER — BUTALBITAL-APAP-CAFFEINE 50-325-40 MG PO TABS
2.0000 | ORAL_TABLET | Freq: Four times a day (QID) | ORAL | Status: DC | PRN
  Administered 2015-08-24: 2 via ORAL
  Filled 2015-08-24: qty 2

## 2015-08-24 MED ORDER — OXYCODONE-ACETAMINOPHEN 5-325 MG PO TABS: 1.0000 | ORAL_TABLET | ORAL | Status: DC | PRN

## 2015-08-24 NOTE — Discharge Summary (Signed)
  Pt admitted for evaluation of contractions and elevated BP.  BPs remained normal and mild, labs and 24hr urine were normal.  Pt received beta x2.  She was discharged with percocet #10 for known kidney stone use to be used sparingly.  Pt continues abx and flomax prescribed 08/22/15.

## 2015-08-24 NOTE — OB Triage Note (Signed)
Patient discharged in stable condition with headache resolved. Educated about s/s of preeclampsia, PTL, ROM, decreased fetal movement. Patient verbalized understanding. Patient has scheduled appointment on Friday at 1330 for follow up care.Patient has no concerns or questions at this time. Patient home with Husband.

## 2015-08-24 NOTE — Discharge Instructions (Signed)

## 2015-08-29 ENCOUNTER — Other Ambulatory Visit: Payer: Self-pay

## 2015-08-29 LAB — OB RESULTS CONSOLE GBS: STREP GROUP B AG: POSITIVE

## 2015-09-02 ENCOUNTER — Encounter (HOSPITAL_COMMUNITY): Payer: Self-pay | Admitting: *Deleted

## 2015-09-02 ENCOUNTER — Inpatient Hospital Stay (HOSPITAL_COMMUNITY)
Admission: AD | Admit: 2015-09-02 | Discharge: 2015-09-02 | Disposition: A | Discharge status: Home / Self Care | Payer: 59 | Source: Ambulatory Visit | Attending: Obstetrics and Gynecology | Admitting: Obstetrics and Gynecology

## 2015-09-02 DIAGNOSIS — N200 Calculus of kidney: Secondary | ICD-10-CM | POA: Diagnosis not present

## 2015-09-02 DIAGNOSIS — Z87442 Personal history of urinary calculi: Secondary | ICD-10-CM | POA: Diagnosis not present

## 2015-09-02 DIAGNOSIS — O139 Gestational [pregnancy-induced] hypertension without significant proteinuria, unspecified trimester: Secondary | ICD-10-CM

## 2015-09-02 DIAGNOSIS — R030 Elevated blood-pressure reading, without diagnosis of hypertension: Secondary | ICD-10-CM | POA: Diagnosis present

## 2015-09-02 DIAGNOSIS — Z3A36 36 weeks gestation of pregnancy: Secondary | ICD-10-CM | POA: Diagnosis not present

## 2015-09-02 DIAGNOSIS — O133 Gestational [pregnancy-induced] hypertension without significant proteinuria, third trimester: Secondary | ICD-10-CM | POA: Insufficient documentation

## 2015-09-02 DIAGNOSIS — O26833 Pregnancy related renal disease, third trimester: Secondary | ICD-10-CM

## 2015-09-02 DIAGNOSIS — Z87891 Personal history of nicotine dependence: Secondary | ICD-10-CM | POA: Insufficient documentation

## 2015-09-02 LAB — URINALYSIS, ROUTINE W REFLEX MICROSCOPIC
BILIRUBIN URINE: NEGATIVE
Glucose, UA: NEGATIVE mg/dL
Ketones, ur: NEGATIVE mg/dL
LEUKOCYTES UA: NEGATIVE
NITRITE: NEGATIVE
Protein, ur: NEGATIVE mg/dL
UROBILINOGEN UA: 0.2 mg/dL (ref 0.0–1.0)
pH: 6.5 (ref 5.0–8.0)

## 2015-09-02 LAB — PROTEIN / CREATININE RATIO, URINE: Creatinine, Urine: 24 mg/dL

## 2015-09-02 LAB — URINE MICROSCOPIC-ADD ON

## 2015-09-02 LAB — CBC
HCT: 36.4 % (ref 36.0–46.0)
Hemoglobin: 12 g/dL (ref 12.0–15.0)
MCH: 30.5 pg (ref 26.0–34.0)
MCHC: 33 g/dL (ref 30.0–36.0)
MCV: 92.6 fL (ref 78.0–100.0)
PLATELETS: 224 10*3/uL (ref 150–400)
RBC: 3.93 MIL/uL (ref 3.87–5.11)
RDW: 14.5 % (ref 11.5–15.5)
WBC: 20.8 10*3/uL — AB (ref 4.0–10.5)

## 2015-09-02 LAB — COMPREHENSIVE METABOLIC PANEL
ALT: 13 U/L — ABNORMAL LOW (ref 14–54)
AST: 21 U/L (ref 15–41)
Albumin: 3.3 g/dL — ABNORMAL LOW (ref 3.5–5.0)
Alkaline Phosphatase: 159 U/L — ABNORMAL HIGH (ref 38–126)
Anion gap: 12 (ref 5–15)
BUN: 12 mg/dL (ref 6–20)
CHLORIDE: 102 mmol/L (ref 101–111)
CO2: 20 mmol/L — ABNORMAL LOW (ref 22–32)
Calcium: 9 mg/dL (ref 8.9–10.3)
Creatinine, Ser: 0.64 mg/dL (ref 0.44–1.00)
Glucose, Bld: 85 mg/dL (ref 65–99)
POTASSIUM: 3.8 mmol/L (ref 3.5–5.1)
SODIUM: 134 mmol/L — AB (ref 135–145)
Total Bilirubin: 0.4 mg/dL (ref 0.3–1.2)
Total Protein: 6.8 g/dL (ref 6.5–8.1)

## 2015-09-02 LAB — DIFFERENTIAL
BASOS PCT: 0 %
Basophils Absolute: 0 10*3/uL (ref 0.0–0.1)
EOS ABS: 0.1 10*3/uL (ref 0.0–0.7)
EOS PCT: 1 %
Lymphocytes Relative: 13 %
Lymphs Abs: 2.6 10*3/uL (ref 0.7–4.0)
MONO ABS: 1.9 10*3/uL — AB (ref 0.1–1.0)
MONOS PCT: 9 %
Neutro Abs: 16.1 10*3/uL — ABNORMAL HIGH (ref 1.7–7.7)
Neutrophils Relative %: 78 %

## 2015-09-02 LAB — LACTATE DEHYDROGENASE: LDH: 167 U/L (ref 98–192)

## 2015-09-02 LAB — URIC ACID: URIC ACID, SERUM: 7 mg/dL — AB (ref 2.3–6.6)

## 2015-09-02 NOTE — Discharge Instructions (Signed)

## 2015-09-02 NOTE — MAU Provider Note (Signed)
Chief Complaint:  Hypertension   First Provider Initiated Contact with Patient 09/02/15 1625      HPI: Brenda Webb is a 29 y.o. G3P1011 at 38w5dwho presents to maternity admissions sent from the office for elevated BP. She reports RUQ abdominal pain, off and on x several weeks but worsening today, and h/a off and on x 1 week. She denies visual disturbances. Tylenol relieves the h/a temporarily but it returns.  She also reports cramping/contractions that are irregular and uncomfortable. She reports good fetal movement, denies LOF, vaginal bleeding, vaginal itching/burning, urinary symptoms, h/a, dizziness, n/v, or fever/chills.    Hypertension This is a new problem. The current episode started 1 to 4 weeks ago. The problem is unchanged. The problem is controlled. Associated symptoms include headaches. Pertinent negatives include no blurred vision, chest pain, neck pain or shortness of breath. Past treatments include nothing. There are no compliance problems.  pregnancy.  Headache  This is a new problem. The current episode started in the past 7 days. The problem occurs intermittently. The problem has been waxing and waning. The pain is located in the frontal region. The pain does not radiate. The pain quality is not similar to prior headaches. The quality of the pain is described as aching. The pain is moderate. Associated symptoms include abdominal pain. Pertinent negatives include no blurred vision, dizziness, fever, nausea, neck pain, photophobia, sinus pressure, tinnitus, visual change, vomiting or weakness. Nothing aggravates the symptoms. She has tried acetaminophen for the symptoms. The treatment provided mild relief. Her past medical history is significant for hypertension.  Abdominal Pain This is a recurrent problem. The current episode started more than 1 month ago. The onset quality is gradual. The problem occurs intermittently. The problem has been waxing and waning. The pain is located in  the RUQ. The pain is moderate. The quality of the pain is colicky and sharp. The abdominal pain radiates to the back. Associated symptoms include headaches. Pertinent negatives include no constipation, diarrhea, dysuria, fever, frequency, nausea or vomiting. The pain is aggravated by deep breathing, movement and palpation. The pain is relieved by certain positions. She has tried acetaminophen for the symptoms. The treatment provided mild relief. Prior diagnostic workup includes ultrasound.    Past Medical History: Past Medical History  Diagnosis Date  . Depression   . Anxiety   . Psoriatic arthritis   . Arthritis   . Kidney stones     Past obstetric history: OB History  Gravida Para Term Preterm AB SAB TAB Ectopic Multiple Living  3 1 1  0 1 1 0 0 0 1    # Outcome Date GA Lbr Len/2nd Weight Sex Delivery Anes PTL Lv  3 Current           2 SAB           1 Term               Past Surgical History: Past Surgical History  Procedure Laterality Date  . Tonsillectomy  2004  . Cholecystectomy  11/12/2011    Procedure: LAPAROSCOPIC CHOLECYSTECTOMY;  Surgeon: Atilano Ina, MD;  Location: [facility];  Service: General;  Laterality: N/A;    Family History: Family History  Problem Relation Age of Onset  . Cancer Paternal Grandfather     bladder    Social History: Social History  Substance Use Topics  . Smoking status: Former Games developer  . Smokeless tobacco: Never Used  . Alcohol Use: No    Allergies:  Allergies  Allergen Reactions  . Shrimp [Shellfish Allergy] Swelling    Meds:  Prescriptions prior to admission  Medication Sig Dispense Refill Last Dose  . Multiple Vitamin (MULTIVITAMIN) tablet Take 2 tablets by mouth daily.   08/21/2015 at Unknown time  . nitrofurantoin, macrocrystal-monohydrate, (MACROBID) 100 MG capsule Take 100 mg by mouth 2 (two) times daily.   08/22/2015 at 0900  . oxyCODONE-acetaminophen (ROXICET) 5-325 MG per tablet Take 1-2 tablets by mouth every 4 (four)  hours as needed for severe pain. 10 tablet 0   . tamsulosin (FLOMAX) 0.4 MG CAPS capsule Take 0.4 mg by mouth daily.    08/22/2015 at 0900  . zolpidem (AMBIEN) 5 MG tablet Take 2.5 mg by mouth at bedtime as needed for sleep.   08/21/2015 at Unknown time    ROS:  Review of Systems  Constitutional: Negative for fever, chills and fatigue.  HENT: Negative for sinus pressure and tinnitus.   Eyes: Negative for blurred vision and photophobia.  Respiratory: Negative for shortness of breath.   Cardiovascular: Negative for chest pain.  Gastrointestinal: Positive for abdominal pain. Negative for nausea, vomiting, diarrhea and constipation.  Genitourinary: Negative for dysuria, frequency, flank pain, vaginal bleeding, vaginal discharge, difficulty urinating, vaginal pain and pelvic pain.  Musculoskeletal: Negative for neck pain.  Neurological: Positive for headaches. Negative for dizziness and weakness.  Psychiatric/Behavioral: Negative.      I have reviewed patient's Past Medical Hx, Surgical Hx, Family Hx, Social Hx, medications and allergies.   Physical Exam  Patient Vitals for the past 24 hrs:  BP Temp Pulse Resp  09/02/15 1800 137/84 mmHg - 104 -  09/02/15 1745 150/99 mmHg - 89 -  09/02/15 1730 (!) 145/102 mmHg - 98 -  09/02/15 1715 138/96 mmHg - 101 -  09/02/15 1700 129/91 mmHg - 101 -  09/02/15 1645 126/96 mmHg - 97 -  09/02/15 1630 126/91 mmHg - 97 -  09/02/15 1615 140/96 mmHg - 99 -  09/02/15 1600 149/99 mmHg - 113 -  09/02/15 1545 (!) 148/106 mmHg - 98 -  09/02/15 1530 144/89 mmHg - 96 -  09/02/15 1515 152/96 mmHg 98.3 F (36.8 C) 114 18  09/02/15 1514 138/99 mmHg - 118 -   Constitutional: Well-developed, well-nourished female in no acute distress.  Cardiovascular: normal rate Respiratory: normal effort GI: Abd soft, non-tender, gravid appropriate for gestational age.  MS: Extremities nontender, no edema, normal ROM Neurologic: Alert and oriented x 4.  GU: Neg  CVAT.   Dilation: 1 Effacement (%): 50 Presentation: Vertex Exam by:: L.Leftwich-Kirby  FHT:  Baseline 140 , moderate variability, accelerations present, no decelerations Contractions: rare, mild to palpation   Labs: Results for orders placed or performed during the hospital encounter of 09/02/15 (from the past 24 hour(s))  Urinalysis, Routine w reflex microscopic (not at [facility])     Status: Abnormal   Collection Time: 09/02/15  3:10 PM  Result Value Ref Range   Color, Urine STRAW (A) YELLOW   APPearance CLEAR CLEAR   Specific Gravity, Urine <1.005 (L) 1.005 - 1.030   pH 6.5 5.0 - 8.0   Glucose, UA NEGATIVE NEGATIVE mg/dL   Hgb urine dipstick TRACE (A) NEGATIVE   Bilirubin Urine NEGATIVE NEGATIVE   Ketones, ur NEGATIVE NEGATIVE mg/dL   Protein, ur NEGATIVE NEGATIVE mg/dL   Urobilinogen, UA 0.2 0.0 - 1.0 mg/dL   Nitrite NEGATIVE NEGATIVE   Leukocytes, UA NEGATIVE NEGATIVE  Urine microscopic-add on     Status: Abnormal   Collection Time:  09/02/15  3:10 PM  Result Value Ref Range   Squamous Epithelial / LPF FEW (A) RARE   WBC, UA 0-2 <3 WBC/hpf   RBC / HPF 0-2 <3 RBC/hpf   Bacteria, UA RARE RARE  Protein / creatinine ratio, urine     Status: None   Collection Time: 09/02/15  3:10 PM  Result Value Ref Range   Creatinine, Urine 24.00 mg/dL   Total Protein, Urine <6 mg/dL   Protein Creatinine Ratio        0.00 - 0.15 mg/mg[Cre]  CBC     Status: Abnormal   Collection Time: 09/02/15  3:55 PM  Result Value Ref Range   WBC 20.8 (H) 4.0 - 10.5 K/uL   RBC 3.93 3.87 - 5.11 MIL/uL   Hemoglobin 12.0 12.0 - 15.0 g/dL   HCT 16.1 09.6 - 04.5 %   MCV 92.6 78.0 - 100.0 fL   MCH 30.5 26.0 - 34.0 pg   MCHC 33.0 30.0 - 36.0 g/dL   RDW 40.9 81.1 - 91.4 %   Platelets 224 150 - 400 K/uL  Comprehensive metabolic panel     Status: Abnormal   Collection Time: 09/02/15  3:55 PM  Result Value Ref Range   Sodium 134 (L) 135 - 145 mmol/L   Potassium 3.8 3.5 - 5.1 mmol/L   Chloride 102 101 -  111 mmol/L   CO2 20 (L) 22 - 32 mmol/L   Glucose, Bld 85 65 - 99 mg/dL   BUN 12 6 - 20 mg/dL   Creatinine, Ser 7.82 0.44 - 1.00 mg/dL   Calcium 9.0 8.9 - 95.6 mg/dL   Total Protein 6.8 6.5 - 8.1 g/dL   Albumin 3.3 (L) 3.5 - 5.0 g/dL   AST 21 15 - 41 U/L   ALT 13 (L) 14 - 54 U/L   Alkaline Phosphatase 159 (H) 38 - 126 U/L   Total Bilirubin 0.4 0.3 - 1.2 mg/dL   GFR calc non Af Amer >60 >60 mL/min   GFR calc Af Amer >60 >60 mL/min   Anion gap 12 5 - 15  Uric acid     Status: Abnormal   Collection Time: 09/02/15  3:55 PM  Result Value Ref Range   Uric Acid, Serum 7.0 (H) 2.3 - 6.6 mg/dL  Lactate dehydrogenase     Status: None   Collection Time: 09/02/15  3:55 PM  Result Value Ref Range   LDH 167 98 - 192 U/L  Differential     Status: Abnormal   Collection Time: 09/02/15  3:55 PM  Result Value Ref Range   Neutrophils Relative % 78 %   Neutro Abs 16.1 (H) 1.7 - 7.7 K/uL   Lymphocytes Relative 13 %   Lymphs Abs 2.6 0.7 - 4.0 K/uL   Monocytes Relative 9 %   Monocytes Absolute 1.9 (H) 0.1 - 1.0 K/uL   Eosinophils Relative 1 %   Eosinophils Absolute 0.1 0.0 - 0.7 K/uL   Basophils Relative 0 %   Basophils Absolute 0.0 0.0 - 0.1 K/uL   WBC Morphology MILD LEFT SHIFT (1-5% METAS, OCC MYELO, OCC BANDS)    O/Negative/-- (03/23 0000)  Imaging:  No results found.  MAU Course/MDM: I have ordered labs and reviewed results.  Consult DrCallahan, reviewed assessment and labs.  Pt stable at time of discharge.  Assessment: 1. Gestational hypertension, antepartum   2. Pregnancy with nephrolithiasis in third trimester     Plan: Discharge home with preeclampsia precautions Labor precautions and fetal  kick counts F/U in office next week Return to MAU as needed for s/sx of labor or for emergencies  Follow-up Information    Follow up with CALLAHAN, SIDNEY, DO.   Specialty:  Obstetrics and Gynecology   Why:  As scheduled   Contact information:   [address] [place] [address] [phone]        Medication List    TAKE these medications        multivitamin tablet  Take 2 tablets by mouth daily.     nitrofurantoin (macrocrystal-monohydrate) 100 MG capsule  Commonly known as:  MACROBID  Take 100 mg by mouth 2 (two) times daily.     oxyCODONE-acetaminophen 5-325 MG per tablet  Commonly known as:  ROXICET  Take 1-2 tablets by mouth every 4 (four) hours as needed for severe pain.     tamsulosin 0.4 MG Caps capsule  Commonly known as:  FLOMAX  Take 0.4 mg by mouth daily.     zolpidem 5 MG tablet  Commonly known as:  AMBIEN  Take 2.5 mg by mouth at bedtime as needed for sleep.        Sharen Counter Certified Nurse-Midwife 09/02/2015 6:14 PM

## 2015-09-02 NOTE — MAU Note (Signed)
Pt sent from  Office with elevated b/ps. C/o mild headache and some white floaters. Good fetal movement reported

## 2015-09-09 ENCOUNTER — Inpatient Hospital Stay (HOSPITAL_COMMUNITY)
Admission: AD | Admit: 2015-09-09 | Discharge: 2015-09-11 | DRG: 767 | Disposition: A | Discharge status: Home / Self Care | Payer: 59 | Source: Ambulatory Visit | Attending: Obstetrics and Gynecology | Admitting: Obstetrics and Gynecology

## 2015-09-09 ENCOUNTER — Encounter (HOSPITAL_COMMUNITY): Payer: Self-pay

## 2015-09-09 ENCOUNTER — Inpatient Hospital Stay (HOSPITAL_COMMUNITY): Payer: 59 | Admitting: Anesthesiology

## 2015-09-09 ENCOUNTER — Encounter (HOSPITAL_COMMUNITY)
Admission: AD | Disposition: A | Discharge status: Home / Self Care | Payer: Self-pay | Source: Ambulatory Visit | Attending: Obstetrics and Gynecology

## 2015-09-09 DIAGNOSIS — O9902 Anemia complicating childbirth: Secondary | ICD-10-CM | POA: Diagnosis present

## 2015-09-09 DIAGNOSIS — Z302 Encounter for sterilization: Secondary | ICD-10-CM | POA: Diagnosis not present

## 2015-09-09 DIAGNOSIS — O4292 Full-term premature rupture of membranes, unspecified as to length of time between rupture and onset of labor: Secondary | ICD-10-CM | POA: Diagnosis present

## 2015-09-09 DIAGNOSIS — F329 Major depressive disorder, single episode, unspecified: Secondary | ICD-10-CM | POA: Diagnosis present

## 2015-09-09 DIAGNOSIS — L4050 Arthropathic psoriasis, unspecified: Secondary | ICD-10-CM | POA: Diagnosis present

## 2015-09-09 DIAGNOSIS — F419 Anxiety disorder, unspecified: Secondary | ICD-10-CM | POA: Diagnosis present

## 2015-09-09 DIAGNOSIS — O133 Gestational [pregnancy-induced] hypertension without significant proteinuria, third trimester: Secondary | ICD-10-CM | POA: Diagnosis present

## 2015-09-09 DIAGNOSIS — N200 Calculus of kidney: Secondary | ICD-10-CM | POA: Diagnosis present

## 2015-09-09 DIAGNOSIS — Z3A37 37 weeks gestation of pregnancy: Secondary | ICD-10-CM | POA: Diagnosis present

## 2015-09-09 DIAGNOSIS — Z87891 Personal history of nicotine dependence: Secondary | ICD-10-CM | POA: Diagnosis not present

## 2015-09-09 DIAGNOSIS — IMO0001 Reserved for inherently not codable concepts without codable children: Secondary | ICD-10-CM

## 2015-09-09 DIAGNOSIS — O99824 Streptococcus B carrier state complicating childbirth: Secondary | ICD-10-CM | POA: Diagnosis present

## 2015-09-09 DIAGNOSIS — B9789 Other viral agents as the cause of diseases classified elsewhere: Secondary | ICD-10-CM | POA: Diagnosis present

## 2015-09-09 DIAGNOSIS — O99344 Other mental disorders complicating childbirth: Secondary | ICD-10-CM | POA: Diagnosis present

## 2015-09-09 LAB — URINALYSIS, DIPSTICK ONLY
BILIRUBIN URINE: NEGATIVE
GLUCOSE, UA: NEGATIVE mg/dL
Hgb urine dipstick: NEGATIVE
KETONES UR: NEGATIVE mg/dL
LEUKOCYTES UA: NEGATIVE
Nitrite: NEGATIVE
PH: 6 (ref 5.0–8.0)
PROTEIN: NEGATIVE mg/dL
Specific Gravity, Urine: 1.01 (ref 1.005–1.030)
Urobilinogen, UA: 0.2 mg/dL (ref 0.0–1.0)

## 2015-09-09 LAB — CBC
HCT: 35.6 % — ABNORMAL LOW (ref 36.0–46.0)
HCT: 35.5 % — ABNORMAL LOW (ref 36.0–46.0)
HEMOGLOBIN: 11.6 g/dL — AB (ref 12.0–15.0)
Hemoglobin: 11.8 g/dL — ABNORMAL LOW (ref 12.0–15.0)
MCH: 30.6 pg (ref 26.0–34.0)
MCH: 30.3 pg (ref 26.0–34.0)
MCHC: 33.1 g/dL (ref 30.0–36.0)
MCHC: 32.7 g/dL (ref 30.0–36.0)
MCV: 92.2 fL (ref 78.0–100.0)
MCV: 92.7 fL (ref 78.0–100.0)
PLATELETS: 212 10*3/uL (ref 150–400)
PLATELETS: 188 10*3/uL (ref 150–400)
RBC: 3.86 MIL/uL — ABNORMAL LOW (ref 3.87–5.11)
RBC: 3.83 MIL/uL — ABNORMAL LOW (ref 3.87–5.11)
RDW: 15.3 % (ref 11.5–15.5)
RDW: 15.3 % (ref 11.5–15.5)
WBC: 19.4 10*3/uL — ABNORMAL HIGH (ref 4.0–10.5)
WBC: 17.6 10*3/uL — ABNORMAL HIGH (ref 4.0–10.5)

## 2015-09-09 LAB — RPR: RPR: NONREACTIVE

## 2015-09-09 SURGERY — Surgical Case
Anesthesia: Regional

## 2015-09-09 MED ORDER — ONDANSETRON HCL 4 MG/2ML IJ SOLN
4.0000 mg | Freq: Four times a day (QID) | INTRAMUSCULAR | Status: DC | PRN
  Administered 2015-09-09: 4 mg via INTRAVENOUS
  Filled 2015-09-09: qty 2

## 2015-09-09 MED ORDER — LACTATED RINGERS IV SOLN: 500.0000 mL | INTRAVENOUS | Status: DC | PRN

## 2015-09-09 MED ORDER — LIDOCAINE HCL (PF) 1 % IJ SOLN
INTRAMUSCULAR | Status: DC | PRN
  Administered 2015-09-09: 3 mL
  Administered 2015-09-09: 2 mL via EPIDURAL
  Administered 2015-09-09: 5 mL via EPIDURAL

## 2015-09-09 MED ORDER — METHYLERGONOVINE MALEATE 0.2 MG PO TABS: 0.2000 mg | ORAL_TABLET | ORAL | Status: DC | PRN

## 2015-09-09 MED ORDER — SODIUM BICARBONATE 8.4 % IV SOLN
INTRAVENOUS | Status: AC
  Filled 2015-09-09: qty 50

## 2015-09-09 MED ORDER — PENICILLIN G POTASSIUM 5000000 UNITS IJ SOLR
2.5000 10*6.[IU] | INTRAVENOUS | Status: DC
  Administered 2015-09-09 (×2): 2.5 10*6.[IU] via INTRAVENOUS
  Filled 2015-09-09 (×4): qty 2.5

## 2015-09-09 MED ORDER — OXYTOCIN 40 UNITS IN LACTATED RINGERS INFUSION - SIMPLE MED
1.0000 m[IU]/min | INTRAVENOUS | Status: DC
  Administered 2015-09-09: 2 m[IU]/min via INTRAVENOUS

## 2015-09-09 MED ORDER — MISOPROSTOL 25 MCG QUARTER TABLET
25.0000 ug | ORAL_TABLET | ORAL | Status: DC | PRN
  Administered 2015-09-09 (×2): 25 ug via VAGINAL
  Filled 2015-09-09 (×2): qty 0.25
  Filled 2015-09-09: qty 1

## 2015-09-09 MED ORDER — MEPERIDINE HCL 25 MG/ML IJ SOLN
INTRAMUSCULAR | Status: AC
  Filled 2015-09-09: qty 1

## 2015-09-09 MED ORDER — BENZOCAINE-MENTHOL 20-0.5 % EX AERO
1.0000 "application " | INHALATION_SPRAY | CUTANEOUS | Status: DC | PRN
  Administered 2015-09-09: 1 via TOPICAL
  Filled 2015-09-09: qty 56

## 2015-09-09 MED ORDER — SODIUM CHLORIDE 0.9 % IV SOLN: 250.0000 mL | INTRAVENOUS | Status: DC | PRN

## 2015-09-09 MED ORDER — OXYTOCIN 10 UNIT/ML IJ SOLN
INTRAMUSCULAR | Status: AC
  Filled 2015-09-09: qty 4

## 2015-09-09 MED ORDER — ONDANSETRON HCL 4 MG/2ML IJ SOLN
INTRAMUSCULAR | Status: AC
  Filled 2015-09-09: qty 2

## 2015-09-09 MED ORDER — PHENYLEPHRINE 40 MCG/ML (10ML) SYRINGE FOR IV PUSH (FOR BLOOD PRESSURE SUPPORT)
80.0000 ug | PREFILLED_SYRINGE | INTRAVENOUS | Status: DC | PRN
  Filled 2015-09-09: qty 20
  Filled 2015-09-09: qty 2

## 2015-09-09 MED ORDER — TERBUTALINE SULFATE 1 MG/ML IJ SOLN
0.2500 mg | Freq: Once | INTRAMUSCULAR | Status: DC | PRN
  Filled 2015-09-09: qty 1

## 2015-09-09 MED ORDER — DIPHENHYDRAMINE HCL 25 MG PO CAPS: 25.0000 mg | ORAL_CAPSULE | Freq: Four times a day (QID) | ORAL | Status: DC | PRN

## 2015-09-09 MED ORDER — ONDANSETRON HCL 4 MG PO TABS: 4.0000 mg | ORAL_TABLET | ORAL | Status: DC | PRN

## 2015-09-09 MED ORDER — MEASLES, MUMPS & RUBELLA VAC ~~LOC~~ INJ: 0.5000 mL | INJECTION | Freq: Once | SUBCUTANEOUS | Status: DC

## 2015-09-09 MED ORDER — LACTATED RINGERS IV SOLN
INTRAVENOUS | Status: DC
  Administered 2015-09-09 (×3): via INTRAVENOUS

## 2015-09-09 MED ORDER — METHYLERGONOVINE MALEATE 0.2 MG/ML IJ SOLN: 0.2000 mg | INTRAMUSCULAR | Status: DC | PRN

## 2015-09-09 MED ORDER — OXYCODONE-ACETAMINOPHEN 5-325 MG PO TABS: 1.0000 | ORAL_TABLET | ORAL | Status: DC | PRN

## 2015-09-09 MED ORDER — NITROFURANTOIN MONOHYD MACRO 100 MG PO CAPS
100.0000 mg | ORAL_CAPSULE | Freq: Two times a day (BID) | ORAL | Status: DC
  Administered 2015-09-10: 100 mg via ORAL
  Filled 2015-09-09 (×2): qty 1

## 2015-09-09 MED ORDER — ONDANSETRON HCL 4 MG/2ML IJ SOLN
4.0000 mg | INTRAMUSCULAR | Status: DC | PRN
  Administered 2015-09-10: 4 mg via INTRAVENOUS

## 2015-09-09 MED ORDER — DIPHENHYDRAMINE HCL 50 MG/ML IJ SOLN: 12.5000 mg | INTRAMUSCULAR | Status: DC | PRN

## 2015-09-09 MED ORDER — TAMSULOSIN HCL 0.4 MG PO CAPS
0.4000 mg | ORAL_CAPSULE | Freq: Every day | ORAL | Status: DC
  Filled 2015-09-09: qty 1

## 2015-09-09 MED ORDER — ZOLPIDEM TARTRATE 5 MG PO TABS: 5.0000 mg | ORAL_TABLET | Freq: Every evening | ORAL | Status: DC | PRN

## 2015-09-09 MED ORDER — ACETAMINOPHEN 325 MG PO TABS: 650.0000 mg | ORAL_TABLET | ORAL | Status: DC | PRN

## 2015-09-09 MED ORDER — SODIUM CHLORIDE 0.9 % IJ SOLN: 3.0000 mL | INTRAMUSCULAR | Status: DC | PRN

## 2015-09-09 MED ORDER — MORPHINE SULFATE (PF) 0.5 MG/ML IJ SOLN
INTRAMUSCULAR | Status: AC
  Filled 2015-09-09: qty 100

## 2015-09-09 MED ORDER — SODIUM CHLORIDE 0.9 % IJ SOLN: 3.0000 mL | Freq: Two times a day (BID) | INTRAMUSCULAR | Status: DC

## 2015-09-09 MED ORDER — PENICILLIN G POTASSIUM 5000000 UNITS IJ SOLR
5.0000 10*6.[IU] | Freq: Once | INTRAVENOUS | Status: AC
  Administered 2015-09-09: 5 10*6.[IU] via INTRAVENOUS
  Filled 2015-09-09: qty 5

## 2015-09-09 MED ORDER — LANOLIN HYDROUS EX OINT: TOPICAL_OINTMENT | CUTANEOUS | Status: DC | PRN

## 2015-09-09 MED ORDER — FERROUS SULFATE 325 (65 FE) MG PO TABS: 325.0000 mg | ORAL_TABLET | Freq: Two times a day (BID) | ORAL | Status: DC

## 2015-09-09 MED ORDER — SIMETHICONE 80 MG PO CHEW: 80.0000 mg | CHEWABLE_TABLET | ORAL | Status: DC | PRN

## 2015-09-09 MED ORDER — LIDOCAINE-EPINEPHRINE (PF) 2 %-1:200000 IJ SOLN
INTRAMUSCULAR | Status: AC
  Filled 2015-09-09: qty 20

## 2015-09-09 MED ORDER — TETANUS-DIPHTH-ACELL PERTUSSIS 5-2.5-18.5 LF-MCG/0.5 IM SUSP: 0.5000 mL | Freq: Once | INTRAMUSCULAR | Status: DC

## 2015-09-09 MED ORDER — LIDOCAINE HCL (PF) 1 % IJ SOLN
30.0000 mL | INTRAMUSCULAR | Status: DC | PRN
  Filled 2015-09-09 (×2): qty 30

## 2015-09-09 MED ORDER — MAGNESIUM HYDROXIDE 400 MG/5ML PO SUSP: 30.0000 mL | ORAL | Status: DC | PRN

## 2015-09-09 MED ORDER — BUTORPHANOL TARTRATE 1 MG/ML IJ SOLN: 1.0000 mg | Freq: Once | INTRAMUSCULAR | Status: DC

## 2015-09-09 MED ORDER — FENTANYL 2.5 MCG/ML BUPIVACAINE 1/10 % EPIDURAL INFUSION (WH - ANES)
14.0000 mL/h | INTRAMUSCULAR | Status: DC | PRN
  Administered 2015-09-09 (×2): 14 mL/h via EPIDURAL
  Filled 2015-09-09 (×2): qty 125

## 2015-09-09 MED ORDER — OXYCODONE-ACETAMINOPHEN 5-325 MG PO TABS
2.0000 | ORAL_TABLET | ORAL | Status: DC | PRN
  Administered 2015-09-10 (×2): 2 via ORAL
  Filled 2015-09-09 (×2): qty 2

## 2015-09-09 MED ORDER — SENNOSIDES-DOCUSATE SODIUM 8.6-50 MG PO TABS
2.0000 | ORAL_TABLET | ORAL | Status: DC
Start: 2015-09-10 — End: 2015-09-10
  Administered 2015-09-09: 2 via ORAL
  Filled 2015-09-09: qty 2

## 2015-09-09 MED ORDER — IBUPROFEN 800 MG PO TABS
800.0000 mg | ORAL_TABLET | Freq: Three times a day (TID) | ORAL | Status: DC
  Administered 2015-09-09 – 2015-09-10 (×2): 800 mg via ORAL
  Filled 2015-09-09 (×2): qty 1

## 2015-09-09 MED ORDER — OXYTOCIN 40 UNITS IN LACTATED RINGERS INFUSION - SIMPLE MED
INTRAVENOUS | Status: AC
  Administered 2015-09-09: 62.5 mL/h via INTRAVENOUS
  Filled 2015-09-09: qty 1000

## 2015-09-09 MED ORDER — OXYTOCIN 40 UNITS IN LACTATED RINGERS INFUSION - SIMPLE MED
62.5000 mL/h | INTRAVENOUS | Status: DC
  Administered 2015-09-09: 62.5 mL/h via INTRAVENOUS

## 2015-09-09 MED ORDER — DIBUCAINE 1 % RE OINT: 1.0000 "application " | TOPICAL_OINTMENT | RECTAL | Status: DC | PRN

## 2015-09-09 MED ORDER — CITRIC ACID-SODIUM CITRATE 334-500 MG/5ML PO SOLN
30.0000 mL | ORAL | Status: DC | PRN
  Filled 2015-09-09: qty 15

## 2015-09-09 MED ORDER — WITCH HAZEL-GLYCERIN EX PADS: 1.0000 "application " | MEDICATED_PAD | CUTANEOUS | Status: DC | PRN

## 2015-09-09 MED ORDER — ACETAMINOPHEN 325 MG PO TABS
650.0000 mg | ORAL_TABLET | ORAL | Status: DC | PRN
  Administered 2015-09-09: 650 mg via ORAL
  Filled 2015-09-09: qty 2

## 2015-09-09 MED ORDER — OXYTOCIN BOLUS FROM INFUSION
500.0000 mL | INTRAVENOUS | Status: DC
Start: 2015-09-09 — End: 2015-09-09
  Administered 2015-09-09: 500 mL via INTRAVENOUS

## 2015-09-09 MED ORDER — OXYCODONE-ACETAMINOPHEN 5-325 MG PO TABS
2.0000 | ORAL_TABLET | ORAL | Status: DC | PRN
Start: 2015-09-09 — End: 2015-09-09

## 2015-09-09 MED ORDER — OXYCODONE-ACETAMINOPHEN 5-325 MG PO TABS
1.0000 | ORAL_TABLET | ORAL | Status: DC | PRN
  Administered 2015-09-09: 1 via ORAL
  Filled 2015-09-09: qty 1

## 2015-09-09 MED ORDER — PRENATAL MULTIVITAMIN CH: 1.0000 | ORAL_TABLET | Freq: Every day | ORAL | Status: DC

## 2015-09-09 MED ORDER — EPHEDRINE 5 MG/ML INJ
10.0000 mg | INTRAVENOUS | Status: DC | PRN
Start: 2015-09-09 — End: 2015-09-09
  Filled 2015-09-09: qty 2

## 2015-09-09 NOTE — Anesthesia Preprocedure Evaluation (Addendum)
Anesthesia Evaluation  Patient identified by MRN, date of birth, ID band Patient awake    Reviewed: Allergy & Precautions, NPO status , Patient's Chart, lab work & pertinent test results  History of Anesthesia Complications Negative for: history of anesthetic complications  Airway Mallampati: II  TM Distance: >3 FB Neck ROM: Full    Dental  (+) Teeth Intact, Dental Advisory Given   Pulmonary former smoker,    Pulmonary exam normal breath sounds clear to auscultation       Cardiovascular Exercise Tolerance: Good hypertension (gestational hypertension), (-) angina(-) Past MI Normal cardiovascular exam Rhythm:Regular Rate:Normal     Neuro/Psych PSYCHIATRIC DISORDERS Anxiety Depression negative neurological ROS     GI/Hepatic negative GI ROS, Neg liver ROS,   Endo/Other  negative endocrine ROS  Renal/GU negative Renal ROS     Musculoskeletal  (+) Arthritis ,   Abdominal   Peds  Hematology  (+) Blood dyscrasia, anemia ,   Anesthesia Other Findings Day of surgery medications reviewed with the patient.  Reproductive/Obstetrics (+) Pregnancy                            Anesthesia Physical Anesthesia Plan  ASA: II  Anesthesia Plan: Epidural   Post-op Pain Management:    Induction:   Airway Management Planned:   Additional Equipment:   Intra-op Plan:   Post-operative Plan:   Informed Consent: I have reviewed the patients History and Physical, chart, labs and discussed the procedure including the risks, benefits and alternatives for the proposed anesthesia with the patient or authorized representative who has indicated his/her understanding and acceptance.   Dental advisory given  Plan Discussed with:   Anesthesia Plan Comments: (Patient identified. Risks/Benefits/Options discussed with patient including but not limited to bleeding, infection, nerve damage, paralysis, failed block,  incomplete pain control, headache, blood pressure changes, nausea, vomiting, reactions to medication both or allergic, itching and postpartum back pain. Confirmed with bedside nurse the patient's most recent platelet count. Confirmed with patient that they are not currently taking any anticoagulation, have any bleeding history or any family history of bleeding disorders. Patient expressed understanding and wished to proceed. All questions were answered. )        Anesthesia Quick Evaluation

## 2015-09-09 NOTE — Anesthesia Procedure Notes (Signed)

## 2015-09-09 NOTE — H&P (Signed)
29 y.o. [redacted]w[redacted]d  G3P1011 comes in for induction for PIH at term.  Otherwise has good fetal movement and no bleeding.  Past Medical History  Diagnosis Date  . Depression   . Anxiety   . Psoriatic arthritis   . Arthritis   . Kidney stones     Past Surgical History  Procedure Laterality Date  . Tonsillectomy  2004  . Cholecystectomy  11/12/2011    Procedure: LAPAROSCOPIC CHOLECYSTECTOMY;  Surgeon: Atilano Ina, MD;  Location: [facility];  Service: General;  Laterality: N/A;    OB History  Gravida Para Term Preterm AB SAB TAB Ectopic Multiple Living  0 1 1 0 0 0 1    # Outcome Date GA Lbr Len/2nd Weight Sex Delivery Anes PTL Lv  3 Current           2 SAB           1 Term               Social History   Social History  . Marital Status: Married    Spouse Name: N/A  . Number of Children: N/A  . Years of Education: N/A   Occupational History  . Not on file.   Social History Main Topics  . Smoking status: Former Games developer  . Smokeless tobacco: Never Used  . Alcohol Use: No  . Drug Use: No  . Sexual Activity: Yes    Birth Control/ Protection: None     Comment: mirena iud   Other Topics Concern  . Not on file   Social History Narrative   Shrimp    Prenatal Transfer Tool  Maternal Diabetes: No Genetic Screening: Declined; AFP was normal Maternal Ultrasounds/Referrals: Normal Fetal Ultrasounds or other Referrals:  None Maternal Substance Abuse:  No Significant Maternal Medications:  Meds include: Other: Flomax Significant Maternal Lab Results: None  Other PNC: Complicated by early pyelo and kidney stones.  Pt has been on Flomax and prophylactic antibiotics and has had mild ureteral dilation.    Filed Vitals:   09/09/15 0225  BP: 138/85  Pulse: 91  Temp:   Resp:      Lungs/Cor:  NAD Abdomen:  soft, gravid Ex:  no cords, erythema SVE:  2/60/-2, AROM clear FHTs:  150s, good STV, NST R Toco:  qocc  Results for orders placed or performed during the  hospital encounter of 09/09/15 (from the past 24 hour(s))  CBC     Status: Abnormal   Collection Time: 09/09/15  2:15 AM  Result Value Ref Range   WBC 19.4 (H) 4.0 - 10.5 K/uL   RBC 3.86 (L) 3.87 - 5.11 MIL/uL   Hemoglobin 11.8 (L) 12.0 - 15.0 g/dL   HCT 16.1 (L) 09.6 - 04.5 %   MCV 92.2 78.0 - 100.0 fL   MCH 30.6 26.0 - 34.0 pg   MCHC 33.1 30.0 - 36.0 g/dL   RDW 40.9 81.1 - 91.4 %   Platelets 212 150 - 400 K/uL  Type and screen     Status: None (Preliminary result)   Collection Time: 09/09/15  2:15 AM  Result Value Ref Range   ABO/RH(D) O NEG    Antibody Screen POS    Sample Expiration 09/12/2015    DAT, IgG PENDING     A/P   [id number] [redacted]w[redacted]d with PIH for induction at term.  BPs stable at this time.  Will check UA for protein.  GBS pos- PCN.  HORVATH,MICHELLE A

## 2015-09-09 NOTE — Progress Notes (Signed)
Pt doing well.  FHTs 130s, gSTV,NST R Tocoq 4-5 SVE 4/80/-2  Continue.

## 2015-09-10 ENCOUNTER — Inpatient Hospital Stay (HOSPITAL_COMMUNITY): Payer: 59 | Admitting: Anesthesiology

## 2015-09-10 ENCOUNTER — Encounter (HOSPITAL_COMMUNITY)
Admission: AD | Disposition: A | Discharge status: Home / Self Care | Payer: Self-pay | Source: Ambulatory Visit | Attending: Obstetrics and Gynecology

## 2015-09-10 HISTORY — PX: TUBAL LIGATION: SHX77

## 2015-09-10 LAB — CBC
HEMATOCRIT: 34.1 % — AB (ref 36.0–46.0)
Hemoglobin: 11.2 g/dL — ABNORMAL LOW (ref 12.0–15.0)
MCH: 30.5 pg (ref 26.0–34.0)
MCHC: 32.8 g/dL (ref 30.0–36.0)
MCV: 92.9 fL (ref 78.0–100.0)
Platelets: 174 10*3/uL (ref 150–400)
RBC: 3.67 MIL/uL — ABNORMAL LOW (ref 3.87–5.11)
RDW: 15.2 % (ref 11.5–15.5)
WBC: 19.2 10*3/uL — AB (ref 4.0–10.5)

## 2015-09-10 LAB — SURGICAL PCR SCREEN
MRSA, PCR: NEGATIVE
Staphylococcus aureus: POSITIVE — AB

## 2015-09-10 SURGERY — LIGATION, FALLOPIAN TUBE, POSTPARTUM
Anesthesia: Epidural | Site: Abdomen | Laterality: Bilateral

## 2015-09-10 MED ORDER — BUPIVACAINE HCL (PF) 0.25 % IJ SOLN
INTRAMUSCULAR | Status: AC
  Filled 2015-09-10: qty 30

## 2015-09-10 MED ORDER — IBUPROFEN 800 MG PO TABS
800.0000 mg | ORAL_TABLET | Freq: Three times a day (TID) | ORAL | Status: DC
  Administered 2015-09-10 – 2015-09-11 (×3): 800 mg via ORAL
  Filled 2015-09-10 (×3): qty 1

## 2015-09-10 MED ORDER — METHYLERGONOVINE MALEATE 0.2 MG/ML IJ SOLN: 0.2000 mg | INTRAMUSCULAR | Status: DC | PRN

## 2015-09-10 MED ORDER — LIDOCAINE-EPINEPHRINE (PF) 2 %-1:200000 IJ SOLN
INTRAMUSCULAR | Status: AC
  Filled 2015-09-10: qty 20

## 2015-09-10 MED ORDER — ONDANSETRON HCL 4 MG/2ML IJ SOLN
INTRAMUSCULAR | Status: AC
  Filled 2015-09-10: qty 2

## 2015-09-10 MED ORDER — SIMETHICONE 80 MG PO CHEW
80.0000 mg | CHEWABLE_TABLET | ORAL | Status: DC | PRN
  Administered 2015-09-10 (×3): 80 mg via ORAL
  Filled 2015-09-10 (×3): qty 1

## 2015-09-10 MED ORDER — FERROUS SULFATE 325 (65 FE) MG PO TABS
325.0000 mg | ORAL_TABLET | Freq: Two times a day (BID) | ORAL | Status: DC
  Administered 2015-09-10 – 2015-09-11 (×2): 325 mg via ORAL
  Filled 2015-09-10 (×2): qty 1

## 2015-09-10 MED ORDER — MEASLES, MUMPS & RUBELLA VAC ~~LOC~~ INJ: 0.5000 mL | INJECTION | Freq: Once | SUBCUTANEOUS | Status: DC

## 2015-09-10 MED ORDER — MAGNESIUM HYDROXIDE 400 MG/5ML PO SUSP
30.0000 mL | ORAL | Status: DC | PRN
  Filled 2015-09-10: qty 30

## 2015-09-10 MED ORDER — BENZOCAINE-MENTHOL 20-0.5 % EX AERO: 1.0000 "application " | INHALATION_SPRAY | CUTANEOUS | Status: DC | PRN

## 2015-09-10 MED ORDER — SODIUM CHLORIDE 0.9 % IJ SOLN: 3.0000 mL | Freq: Two times a day (BID) | INTRAMUSCULAR | Status: DC

## 2015-09-10 MED ORDER — LACTATED RINGERS IV SOLN
INTRAVENOUS | Status: DC
  Administered 2015-09-10: 09:00:00 via INTRAVENOUS

## 2015-09-10 MED ORDER — ZOLPIDEM TARTRATE 5 MG PO TABS: 5.0000 mg | ORAL_TABLET | Freq: Every evening | ORAL | Status: DC | PRN

## 2015-09-10 MED ORDER — FENTANYL CITRATE (PF) 100 MCG/2ML IJ SOLN
25.0000 ug | INTRAMUSCULAR | Status: DC | PRN
  Administered 2015-09-10 (×2): 50 ug via INTRAVENOUS

## 2015-09-10 MED ORDER — FENTANYL CITRATE (PF) 100 MCG/2ML IJ SOLN
INTRAMUSCULAR | Status: AC
  Administered 2015-09-10: 50 ug via INTRAVENOUS
  Filled 2015-09-10: qty 2

## 2015-09-10 MED ORDER — SODIUM BICARBONATE 8.4 % IV SOLN
INTRAVENOUS | Status: DC | PRN
  Administered 2015-09-10 (×2): 5 mL via EPIDURAL
  Administered 2015-09-10: 3 mL via EPIDURAL
  Administered 2015-09-10: 7 mL via EPIDURAL

## 2015-09-10 MED ORDER — SODIUM CHLORIDE 0.9 % IV SOLN
250.0000 mL | INTRAVENOUS | Status: DC | PRN
Start: 2015-09-10 — End: 2015-09-11

## 2015-09-10 MED ORDER — TETANUS-DIPHTH-ACELL PERTUSSIS 5-2.5-18.5 LF-MCG/0.5 IM SUSP: 0.5000 mL | Freq: Once | INTRAMUSCULAR | Status: DC

## 2015-09-10 MED ORDER — MIDAZOLAM HCL 2 MG/2ML IJ SOLN
INTRAMUSCULAR | Status: AC
  Filled 2015-09-10: qty 4

## 2015-09-10 MED ORDER — MIDAZOLAM HCL 2 MG/2ML IJ SOLN
INTRAMUSCULAR | Status: DC | PRN
  Administered 2015-09-10: 2 mg via INTRAVENOUS

## 2015-09-10 MED ORDER — FAMOTIDINE 20 MG PO TABS
40.0000 mg | ORAL_TABLET | Freq: Once | ORAL | Status: AC
  Administered 2015-09-10: 40 mg via ORAL
  Filled 2015-09-10: qty 2

## 2015-09-10 MED ORDER — OXYCODONE-ACETAMINOPHEN 5-325 MG PO TABS
2.0000 | ORAL_TABLET | ORAL | Status: DC | PRN
  Administered 2015-09-10 – 2015-09-11 (×5): 2 via ORAL
  Filled 2015-09-10 (×5): qty 2

## 2015-09-10 MED ORDER — BUTORPHANOL TARTRATE 1 MG/ML IJ SOLN
INTRAMUSCULAR | Status: AC
  Administered 2015-09-10: 2 mg via INTRAVENOUS
  Filled 2015-09-10: qty 2

## 2015-09-10 MED ORDER — SODIUM CHLORIDE 0.9 % IJ SOLN: 3.0000 mL | INTRAMUSCULAR | Status: DC | PRN

## 2015-09-10 MED ORDER — METHYLERGONOVINE MALEATE 0.2 MG PO TABS: 0.2000 mg | ORAL_TABLET | ORAL | Status: DC | PRN

## 2015-09-10 MED ORDER — ACETAMINOPHEN 325 MG PO TABS: 650.0000 mg | ORAL_TABLET | ORAL | Status: DC | PRN

## 2015-09-10 MED ORDER — METOCLOPRAMIDE HCL 10 MG PO TABS
10.0000 mg | ORAL_TABLET | Freq: Once | ORAL | Status: AC
  Administered 2015-09-10: 10 mg via ORAL
  Filled 2015-09-10: qty 1

## 2015-09-10 MED ORDER — BUPIVACAINE HCL (PF) 0.25 % IJ SOLN
INTRAMUSCULAR | Status: DC | PRN
  Administered 2015-09-10: 10 mL

## 2015-09-10 MED ORDER — PRENATAL MULTIVITAMIN CH
1.0000 | ORAL_TABLET | Freq: Every day | ORAL | Status: DC
  Administered 2015-09-10 – 2015-09-11 (×2): 1 via ORAL
  Filled 2015-09-10 (×2): qty 1

## 2015-09-10 MED ORDER — ONDANSETRON HCL 4 MG/2ML IJ SOLN: 4.0000 mg | INTRAMUSCULAR | Status: DC | PRN

## 2015-09-10 MED ORDER — SODIUM BICARBONATE 8.4 % IV SOLN
INTRAVENOUS | Status: AC
  Filled 2015-09-10: qty 50

## 2015-09-10 MED ORDER — SENNOSIDES-DOCUSATE SODIUM 8.6-50 MG PO TABS
2.0000 | ORAL_TABLET | ORAL | Status: DC
  Administered 2015-09-10: 2 via ORAL
  Filled 2015-09-10 (×3): qty 2

## 2015-09-10 MED ORDER — DIBUCAINE 1 % RE OINT: 1.0000 "application " | TOPICAL_OINTMENT | RECTAL | Status: DC | PRN

## 2015-09-10 MED ORDER — BUTORPHANOL TARTRATE 1 MG/ML IJ SOLN: 2.0000 mg | Freq: Once | INTRAMUSCULAR | Status: DC

## 2015-09-10 MED ORDER — DIPHENHYDRAMINE HCL 25 MG PO CAPS
25.0000 mg | ORAL_CAPSULE | Freq: Four times a day (QID) | ORAL | Status: DC | PRN
  Filled 2015-09-10: qty 1

## 2015-09-10 MED ORDER — WITCH HAZEL-GLYCERIN EX PADS: 1.0000 "application " | MEDICATED_PAD | CUTANEOUS | Status: DC | PRN

## 2015-09-10 MED ORDER — ONDANSETRON HCL 4 MG PO TABS: 4.0000 mg | ORAL_TABLET | ORAL | Status: DC | PRN

## 2015-09-10 MED ORDER — LANOLIN HYDROUS EX OINT: TOPICAL_OINTMENT | CUTANEOUS | Status: DC | PRN

## 2015-09-10 MED ORDER — BUTORPHANOL TARTRATE 2 MG/ML IJ SOLN
2.0000 mg | Freq: Once | INTRAMUSCULAR | Status: AC
  Administered 2015-09-10: 2 mg via INTRAVENOUS

## 2015-09-10 MED ORDER — OXYCODONE-ACETAMINOPHEN 5-325 MG PO TABS
1.0000 | ORAL_TABLET | ORAL | Status: DC | PRN
  Administered 2015-09-10 – 2015-09-11 (×2): 1 via ORAL
  Filled 2015-09-10 (×2): qty 1

## 2015-09-10 SURGICAL SUPPLY — 22 items
BLADE SURG 10 STRL SS (BLADE) ×2 IMPLANT
CHLORAPREP W/TINT 26ML (MISCELLANEOUS) ×2 IMPLANT
CLIP FILSHIE TUBAL LIGA STRL (Clip) ×2 IMPLANT
CLOTH BEACON ORANGE TIMEOUT ST (SAFETY) ×2 IMPLANT
DRSG OPSITE POSTOP 3X4 (GAUZE/BANDAGES/DRESSINGS) ×2 IMPLANT
ELECT REM PT RETURN 9FT ADLT (ELECTROSURGICAL) ×2
ELECTRODE REM PT RTRN 9FT ADLT (ELECTROSURGICAL) ×1 IMPLANT
GLOVE BIO SURGEON STRL SZ7 (GLOVE) ×2 IMPLANT
GOWN STRL REUS W/TWL LRG LVL3 (GOWN DISPOSABLE) ×4 IMPLANT
LIQUID BAND (GAUZE/BANDAGES/DRESSINGS) ×2 IMPLANT
NEEDLE HYPO 22GX1.5 SAFETY (NEEDLE) ×2 IMPLANT
NS IRRIG 1000ML POUR BTL (IV SOLUTION) ×2 IMPLANT
PACK ABDOMINAL MINOR (CUSTOM PROCEDURE TRAY) ×2 IMPLANT
PENCIL BUTTON HOLSTER BLD 10FT (ELECTRODE) ×2 IMPLANT
SPONGE LAP 4X18 X RAY DECT (DISPOSABLE) ×2 IMPLANT
SUT PLAIN 0 NONE (SUTURE) IMPLANT
SUT VIC AB 2-0 UR5 27 (SUTURE) ×2 IMPLANT
SUT VICRYL RAPIDE 4/0 PS 2 (SUTURE) ×2 IMPLANT
SYR CONTROL 10ML LL (SYRINGE) ×2 IMPLANT
TOWEL OR 17X24 6PK STRL BLUE (TOWEL DISPOSABLE) ×4 IMPLANT
TRAY FOLEY CATH SILVER 14FR (SET/KITS/TRAYS/PACK) ×2 IMPLANT
WATER STERILE IRR 1000ML POUR (IV SOLUTION) ×2 IMPLANT

## 2015-09-10 NOTE — Transfer of Care (Signed)
Immediate Anesthesia Transfer of Care Note  Patient: Brenda Webb  Procedure(s) Performed: Procedure(s): POST PARTUM TUBAL LIGATION (Bilateral)  Patient Location: PACU  Anesthesia Type:Epidural  Level of Consciousness: awake and alert   Airway & Oxygen Therapy: Patient Spontanous Breathing  Post-op Assessment: Report given to RN and Post -op Vital signs reviewed and stable  Post vital signs: Reviewed and stable  Last Vitals:  Filed Vitals:   09/10/15 0649  BP: 130/82  Pulse: 79  Temp: 37 C  Resp: 18    Complications: No apparent anesthesia complications

## 2015-09-10 NOTE — Lactation Note (Signed)
This note was copied from the chart of Brenda Webb. Lactation Consultation Note   Addendum- Infant with 5 voids and 7 stool in last 24 hours.  Patient Name: Brenda Mischele Detter [id number] Date: 09/10/2015 Reason for consult: Initial assessment   Maternal Data Formula Feeding for Exclusion: No Has patient been taught Hand Expression?: Yes Does the patient have breastfeeding experience prior to this delivery?: Yes  Feeding Feeding Type: Breast Fed Length of feed: 45 min  LATCH Score/Interventions                      Lactation Tools Discussed/Used     Consult Status Consult Status: PRN    Ed Blalock 09/10/2015, 7:54 PM

## 2015-09-10 NOTE — Op Note (Signed)
09/09/2015 - 09/10/2015  9:49 AM  PATIENT:  Brenda Webb  29 y.o. female  PRE-OPERATIVE DIAGNOSIS:  Desires Sterilization  POST-OPERATIVE DIAGNOSIS:  Desires Sterilization  PROCEDURE:  Procedure(s): POST PARTUM TUBAL LIGATION (Bilateral)  SURGEON:  Surgeon(s) and Role:    * Carrington Clamp, MD - Primary  ANESTHESIA:   epidural  EBL:  Total I/O In: 600 [I.V.:600] Out: 20 [Urine:20]   LOCAL MEDICATIONS USED:  MARCAINE     SPECIMEN:  No Specimen  DISPOSITION OF SPECIMEN:  PATHOLOGY  COUNTS:  YES  TOURNIQUET:  * No tourniquets in log *  DICTATION: .Note written in EPIC  PLAN OF CARE: Admit to inpatient   PATIENT DISPOSITION:  PACU - hemodynamically stable.   Delay start of Pharmacological VTE agent (>24hrs) due to surgical blood loss or risk of bleeding: not applicable  Findings: normal tubes bilaterally, ovaries palpated normal.  Complications: none.  Prior to the surgery the patient was counseled that the procedure was permanent and all risks, benefits, and alternatives had been discussed.  After adequate spinal anesthesia was achieved, 0.25% Marcaine was injected infraumbilically. A 2 cm skin incision was made with the scalpel and carried down layer by layer with the aid of two allises.  At the level of the peritoneum, it was tented up and incised carefully with the scalper. Then retractors were used to identify each tube in succession and each was tented up with two babcocks and followed to it's fimbriated end.  Two filschie clips were placed on the isthmic portion of each tube.  The clips were confirmed to occlude the entire circumference of each tube. Each tube was then placed back intra-abdominally and the ovaries palpated normal.  The fachial incision was closed with 2-0 vicryl in a running stitch with the aid of kochers.  The skin was closed with dermabond.  Findings: normal tubes bilaterally, ovaries palpated normal.  Complications: none.  Prior to the surgery  the patient was counseled that the procedure was permanent and all risks, benefits, and alternatives had been discussed.  After adequate spinal anesthesia was achieved,  A 2 cm skin incision was made with the scalpel and carried down layer by layer with the aid of two allises.  At the level of the peritoneum, it was tented up and incised carefully with the scalper. Then retractors were used to identify each tube in succession and each was tented up with two babcocks and followed to it's fimbriated end.  Two filschie clips were placed on the isthmic portion of each tube.  The clips were confirmed to occlude the entire circumference of each tube. 0.25% Marcaine was dribbled on each tube. Each tube was then placed back intra-abdominally and the ovaries palpated normal.  The fachial incision was closed with 2-0 vicryl in a running stitch with the aid of kochers.  The skin was closed with 3- vicryl R with one stitch and dermabond.

## 2015-09-10 NOTE — Discharge Summary (Signed)
Obstetric Discharge Summary Reason for Admission: onset of labor Prenatal Procedures: none Intrapartum Procedures: spontaneous vaginal delivery Postpartum Procedures: postpartum tubal Complications-Operative and Postpartum: none HEMOGLOBIN  Date Value Ref Range Status  09/10/2015 11.2* 12.0 - 15.0 g/dL Final   HCT  Date Value Ref Range Status  09/10/2015 34.1* 36.0 - 46.0 % Final    Discharge Diagnoses: Term Pregnancy-delivered  Discharge Information: Date: 09/10/2015 Activity: pelvic rest Diet: routine Medications: Ibuprofen and Percocet Condition: stable Instructions: refer to practice specific booklet Discharge to: home   Newborn Data: Live born female  Birth Weight: 7 lb 1.4 oz (3215 g) APGAR: 9, 9  Home with mother.  HORVATH,MICHELLE A 09/10/2015, 8:00 AM

## 2015-09-10 NOTE — Anesthesia Postprocedure Evaluation (Signed)
  Anesthesia Post-op Note  Patient: Brenda Webb  Procedure(s) Performed: Procedure(s): POST PARTUM TUBAL LIGATION (Bilateral)  Patient Location: Mother/Baby  Anesthesia Type:Epidural  Level of Consciousness: awake  Airway and Oxygen Therapy: Patient Spontanous Breathing  Post-op Pain: mild  Post-op Assessment: Post-op Vital signs reviewed, Patient's Cardiovascular Status Stable, Respiratory Function Stable, No signs of Nausea or vomiting, Pain level controlled, No headache, Spinal receding and Patient able to bend at knees  Post-op Vital Signs: Reviewed  Last Vitals:  Filed Vitals:   09/10/15 1452  BP: 142/86  Pulse: 84  Temp: 36.7 C  Resp: 22    Complications: No apparent anesthesia complications

## 2015-09-10 NOTE — Anesthesia Preprocedure Evaluation (Signed)
Anesthesia Evaluation  Patient identified by MRN, date of birth, ID band Patient awake    Reviewed: Allergy & Precautions, NPO status , Patient's Chart, lab work & pertinent test results  History of Anesthesia Complications Negative for: history of anesthetic complications  Airway Mallampati: II  TM Distance: >3 FB Neck ROM: Full    Dental  (+) Teeth Intact, Dental Advisory Given   Pulmonary former smoker,    Pulmonary exam normal breath sounds clear to auscultation       Cardiovascular Exercise Tolerance: Good hypertension (gestational hypertension), (-) angina(-) Past MI Normal cardiovascular exam Rhythm:Regular Rate:Normal     Neuro/Psych PSYCHIATRIC DISORDERS Anxiety Depression negative neurological ROS     GI/Hepatic negative GI ROS, Neg liver ROS,   Endo/Other  negative endocrine ROS  Renal/GU negative Renal ROS     Musculoskeletal  (+) Arthritis ,   Abdominal   Peds  Hematology  (+) Blood dyscrasia, anemia ,   Anesthesia Other Findings Day of surgery medications reviewed with the patient.  Reproductive/Obstetrics (+) Pregnancy                             Anesthesia Physical  Anesthesia Plan  ASA: II  Anesthesia Plan: Epidural   Post-op Pain Management:    Induction:   Airway Management Planned:   Additional Equipment:   Intra-op Plan:   Post-operative Plan:   Informed Consent: I have reviewed the patients History and Physical, chart, labs and discussed the procedure including the risks, benefits and alternatives for the proposed anesthesia with the patient or authorized representative who has indicated his/her understanding and acceptance.   Dental advisory given  Plan Discussed with:   Anesthesia Plan Comments: (Patient identified. Risks/Benefits/Options discussed with patient including but not limited to bleeding, infection, nerve damage, paralysis, failed  block, incomplete pain control, headache, blood pressure changes, nausea, vomiting, reactions to medication both or allergic, itching and postpartum back pain. Confirmed with bedside nurse the patient's most recent platelet count. Confirmed with patient that they are not currently taking any anticoagulation, have any bleeding history or any family history of bleeding disorders. Patient expressed understanding and wished to proceed. All questions were answered. )        Anesthesia Quick Evaluation                                  Anesthesia Evaluation  Patient identified by MRN, date of birth, ID band Patient awake    Reviewed: Allergy & Precautions, NPO status , Patient's Chart, lab work & pertinent test results  History of Anesthesia Complications Negative for: history of anesthetic complications  Airway Mallampati: II  TM Distance: >3 FB Neck ROM: Full    Dental  (+) Teeth Intact, Dental Advisory Given   Pulmonary former smoker,    Pulmonary exam normal breath sounds clear to auscultation       Cardiovascular Exercise Tolerance: Good hypertension (gestational hypertension), (-) angina(-) Past MI Normal cardiovascular exam Rhythm:Regular Rate:Normal     Neuro/Psych PSYCHIATRIC DISORDERS Anxiety Depression negative neurological ROS     GI/Hepatic negative GI ROS, Neg liver ROS,   Endo/Other  negative endocrine ROS  Renal/GU negative Renal ROS     Musculoskeletal  (+) Arthritis ,   Abdominal   Peds  Hematology  (+) Blood dyscrasia, anemia ,   Anesthesia Other Findings Day of surgery medications reviewed with the  patient.  Reproductive/Obstetrics (+) Pregnancy                            Anesthesia Physical Anesthesia Plan  ASA: II  Anesthesia Plan: Epidural   Post-op Pain Management:    Induction:   Airway Management Planned:   Additional Equipment:   Intra-op Plan:   Post-operative Plan:   Informed  Consent: I have reviewed the patients History and Physical, chart, labs and discussed the procedure including the risks, benefits and alternatives for the proposed anesthesia with the patient or authorized representative who has indicated his/her understanding and acceptance.   Dental advisory given  Plan Discussed with:   Anesthesia Plan Comments: (Patient identified. Risks/Benefits/Options discussed with patient including but not limited to bleeding, infection, nerve damage, paralysis, failed block, incomplete pain control, headache, blood pressure changes, nausea, vomiting, reactions to medication both or allergic, itching and postpartum back pain. Confirmed with bedside nurse the patient's most recent platelet count. Confirmed with patient that they are not currently taking any anticoagulation, have any bleeding history or any family history of bleeding disorders. Patient expressed understanding and wished to proceed. All questions were answered. )        Anesthesia Quick Evaluation

## 2015-09-10 NOTE — Progress Notes (Signed)
Patient is eating, ambulating, voiding.  Pain control is good.  Filed Vitals:   09/09/15 2210 09/09/15 2346 09/10/15 0510 09/10/15 0649  BP: 129/68 143/92 130/98 130/82  Pulse: 92 96 85 79  Temp: 99.2 F (37.3 C) 97.8 F (36.6 C) 98.1 F (36.7 C) 98.6 F (37 C)  TempSrc: Oral Oral Oral Oral  Resp: Height:      Weight:      SpO2: 98% 98% 98% 98%    Fundus firm Perineum without swelling.  Lab Results  Component Value Date   WBC 19.2* 09/10/2015   HGB 11.2* 09/10/2015   HCT 34.1* 09/10/2015   MCV 92.9 09/10/2015   PLT 174 09/10/2015    --/--/O NEG (09/23 0215)/RI  A/P Post partum day 1.  Routine care.  Expect d/c routine.    Parents desires circumsision.  All risks, benefits and alternatives discussed with the mother. Pt desires post partum tubal ligation - all risks, benefits, and alternatives d/w pt.  Baby RH NEG- no Rhogam. HORVATH,MICHELLE A

## 2015-09-10 NOTE — Brief Op Note (Addendum)
09/09/2015 - 09/10/2015  9:49 AM  PATIENT:  Brenda Webb  29 y.o. female  PRE-OPERATIVE DIAGNOSIS:  Desires Sterilization  POST-OPERATIVE DIAGNOSIS:  Desires Sterilization  PROCEDURE:  Procedure(s): POST PARTUM TUBAL LIGATION (Bilateral)  SURGEON:  Surgeon(s) and Role:    * Carrington Clamp, MD - Primary  ANESTHESIA:   epidural  EBL:  Total I/O In: 600 [I.V.:600] Out: 20 [Urine:20]   LOCAL MEDICATIONS USED:  MARCAINE     SPECIMEN:  No Specimen  DISPOSITION OF SPECIMEN:  PATHOLOGY  COUNTS:  YES  TOURNIQUET:  * No tourniquets in log *  DICTATION: .Note written in EPIC  PLAN OF CARE: Admit to inpatient   PATIENT DISPOSITION:  PACU - hemodynamically stable.   Delay start of Pharmacological VTE agent (>24hrs) due to surgical blood loss or risk of bleeding: not applicable  Findings: normal tubes bilaterally, ovaries palpated normal.  Complications: none.  Prior to the surgery the patient was counseled that the procedure was permanent and all risks, benefits, and alternatives had been discussed.  After adequate spinal anesthesia was achieved, 0.25% Marcaine was injected infraumbilically. A 2 cm skin incision was made with the scalpel and carried down layer by layer with the aid of two allises.  At the level of the peritoneum, it was tented up and incised carefully with the scalper. Then retractors were used to identify each tube in succession and each was tented up with two babcocks and followed to it's fimbriated end.  Two filschie clips were placed on the isthmic portion of each tube.  The clips were confirmed to occlude the entire circumference of each tube. Each tube was then placed back intra-abdominally and the ovaries palpated normal.  The fachial incision was closed with 2-0 vicryl in a running stitch with the aid of kochers.  The skin was closed with dermabond.  Findings: normal tubes bilaterally, ovaries palpated normal.  Complications: none.  Prior to the surgery  the patient was counseled that the procedure was permanent and all risks, benefits, and alternatives had been discussed.  After adequate spinal anesthesia was achieved,  A 2 cm skin incision was made with the scalpel and carried down layer by layer with the aid of two allises.  At the level of the peritoneum, it was tented up and incised carefully with the scalper. Then retractors were used to identify each tube in succession and each was tented up with two babcocks and followed to it's fimbriated end.  Two filschie clips were placed on the isthmic portion of each tube.  The clips were confirmed to occlude the entire circumference of each tube. 0.25% Marcaine was dribbled on each tube. Each tube was then placed back intra-abdominally and the ovaries palpated normal.  The fachial incision was closed with 2-0 vicryl in a running stitch with the aid of kochers.  The skin was closed with dermabond.

## 2015-09-10 NOTE — Lactation Note (Signed)
This note was copied from the chart of Brenda Webb. Lactation Consultation Note  Initial Consult on 61 hour old infant for 2nd time mom. Mom reports that she BF her 28 yo who was born at 37 weeks for 18 months. Baby is asleep in Grandfathers arms and did not see feed. Mom reports no nipple soreness. Mom reports that this baby is awakening to feed independently and she is feeding at first feeding cues. She feels like he is latching well. She says he favors left breast over right breast and she has used football, cradle and side lying position to get infant to feed. BF basics reviewed, enc 8-12 feeds in 24 hours upon first feeding cues. Mom reports she does have to use awakening techniques when infant is feeding. Enc. Mom to massage/compress breast with feeding to facilitate milk transfer. Discussed potential for more sleepiness and decreased interest in feeding in the coming few days due to early term GA. Enc mom to call for assistance as needed and to keep up the good work. [facility] Brochure given with phone #. Informed mom of OP LC services, Support Groups and BF Resources. Mom says she has a Medela DEBP at home and is able to hand express milk.   Patient Name: Brenda Webb [id number] Date: 09/10/2015 Reason for consult: Initial assessment   Maternal Data Formula Feeding for Exclusion: No Has patient been taught Hand Expression?: Yes Does the patient have breastfeeding experience prior to this delivery?: Yes  Feeding Feeding Type: Breast Fed Length of feed: 45 min  LATCH Score/Interventions                      Lactation Tools Discussed/Used     Consult Status Consult Status: PRN    Ed Blalock 09/10/2015, 7:44 PM

## 2015-09-11 LAB — CBC
HEMATOCRIT: 33.1 % — AB (ref 36.0–46.0)
Hemoglobin: 10.8 g/dL — ABNORMAL LOW (ref 12.0–15.0)
MCH: 30.8 pg (ref 26.0–34.0)
MCHC: 32.6 g/dL (ref 30.0–36.0)
MCV: 94.3 fL (ref 78.0–100.0)
Platelets: 198 10*3/uL (ref 150–400)
RBC: 3.51 MIL/uL — ABNORMAL LOW (ref 3.87–5.11)
RDW: 15.6 % — AB (ref 11.5–15.5)
WBC: 15.4 10*3/uL — ABNORMAL HIGH (ref 4.0–10.5)

## 2015-09-11 MED ORDER — OXYCODONE-ACETAMINOPHEN 5-325 MG PO TABS: 1.0000 | ORAL_TABLET | ORAL | Status: DC | PRN

## 2015-09-11 NOTE — Lactation Note (Signed)
This note was copied from the chart of Brenda Yuleni Burich. Lactation Consultation Note  Answered questions regarding history of yeast infections. Observed latch.  Rhythmical sucks and swallows observed. Mother's nipples tender.  Provided comfort gels and discussed apply ebm. Reviewed engorgement care and monitoring voids/stools.  Patient Name: Brenda Webb Date: 09/11/2015 Reason for consult: Follow-up assessment   Maternal Data    Feeding Feeding Type: Breast Fed  LATCH Score/Interventions Latch: Grasps breast easily, tongue down, lips flanged, rhythmical sucking.  Audible Swallowing: A few with stimulation  Type of Nipple: Everted at rest and after stimulation  Comfort (Breast/Nipple): Filling, red/small blisters or bruises, mild/mod discomfort  Problem noted: Mild/Moderate discomfort Interventions (Mild/moderate discomfort): Hand expression;Comfort gels  Hold (Positioning): No assistance needed to correctly position infant at breast.  LATCH Score: 8  Lactation Tools Discussed/Used     Consult Status Consult Status: Complete    Hardie Pulley 09/11/2015, 8:46 AM

## 2015-09-11 NOTE — Progress Notes (Signed)
Patient is eating, ambulating, voiding.  Pain control is good.  Filed Vitals:   09/10/15 1452 09/10/15 1901 09/10/15 2200 09/11/15 0155  BP: 142/86 143/78 134/76 132/70  Pulse: 84 87 78 70  Temp: 98 F (36.7 C) 98.2 F (36.8 C) 98.6 F (37 C) 98 F (36.7 C)  TempSrc:  Oral Oral Oral  Resp: Height:      Weight:      SpO2: 99% 96% 99% 98%    Fundus firm Perineum without swelling.  Lab Results  Component Value Date   WBC 19.2* 09/10/2015   HGB 11.2* 09/10/2015   HCT 34.1* 09/10/2015   MCV 92.9 09/10/2015   PLT 174 09/10/2015    --/--/O NEG (09/23 0215)/RI; no Rhogam- baby RHneg  A/P Post partum day 2 and POD 1 from BTL.  Routine care.  Expect d/c today.    Rondell Frick A

## 2015-09-11 NOTE — Anesthesia Postprocedure Evaluation (Signed)
  Anesthesia Post-op Note  Patient: Brenda Webb  Procedure(s) Performed: * No procedures listed *  Patient Location: Mother/Baby  Anesthesia Type:Epidural  Level of Consciousness: awake  Airway and Oxygen Therapy: Patient Spontanous Breathing  Post-op Pain: mild  Post-op Assessment: Post-op Vital signs reviewed, Patient's Cardiovascular Status Stable, Respiratory Function Stable, Patent Airway, No signs of Nausea or vomiting and Pain level controlled              Post-op Vital Signs: Reviewed and stable  Last Vitals: There were no vitals filed for this visit.  Complications: No apparent anesthesia complications

## 2015-09-12 ENCOUNTER — Encounter (HOSPITAL_COMMUNITY): Payer: Self-pay | Admitting: Obstetrics and Gynecology

## 2015-09-13 LAB — TYPE AND SCREEN
ABO/RH(D): O NEG
Antibody Screen: POSITIVE
DAT, IGG: NEGATIVE
UNIT DIVISION: 0
Unit division: 0

## 2016-02-24 ENCOUNTER — Other Ambulatory Visit: Payer: Self-pay | Admitting: Internal Medicine

## 2016-02-24 DIAGNOSIS — M25551 Pain in right hip: Secondary | ICD-10-CM

## 2016-02-28 ENCOUNTER — Other Ambulatory Visit: Payer: Self-pay | Admitting: Obstetrics and Gynecology

## 2016-03-01 LAB — CYTOLOGY - PAP

## 2016-03-06 ENCOUNTER — Other Ambulatory Visit: Payer: Self-pay | Admitting: Obstetrics and Gynecology

## 2016-03-06 ENCOUNTER — Encounter (HOSPITAL_COMMUNITY): Payer: Self-pay

## 2016-03-06 ENCOUNTER — Ambulatory Visit
Admission: RE | Admit: 2016-03-06 | Discharge: 2016-03-06 | Disposition: A | Discharge status: Home / Self Care | Payer: BLUE CROSS/BLUE SHIELD | Source: Ambulatory Visit | Attending: Internal Medicine | Admitting: Internal Medicine

## 2016-03-06 DIAGNOSIS — M25551 Pain in right hip: Secondary | ICD-10-CM

## 2016-03-20 ENCOUNTER — Ambulatory Visit (HOSPITAL_COMMUNITY)
Admission: RE | Admit: 2016-03-20 | Discharge: 2016-03-20 | Disposition: A | Discharge status: Home / Self Care | Payer: BLUE CROSS/BLUE SHIELD | Source: Ambulatory Visit | Attending: Obstetrics and Gynecology | Admitting: Obstetrics and Gynecology

## 2016-03-20 ENCOUNTER — Encounter (HOSPITAL_COMMUNITY): Payer: Self-pay | Admitting: Certified Registered Nurse Anesthetist

## 2016-03-20 ENCOUNTER — Ambulatory Visit (HOSPITAL_COMMUNITY): Payer: BLUE CROSS/BLUE SHIELD | Admitting: Certified Registered Nurse Anesthetist

## 2016-03-20 ENCOUNTER — Encounter (HOSPITAL_COMMUNITY)
Admission: RE | Disposition: A | Discharge status: Home / Self Care | Payer: Self-pay | Source: Ambulatory Visit | Attending: Obstetrics and Gynecology

## 2016-03-20 DIAGNOSIS — Z87442 Personal history of urinary calculi: Secondary | ICD-10-CM | POA: Diagnosis not present

## 2016-03-20 DIAGNOSIS — F418 Other specified anxiety disorders: Secondary | ICD-10-CM | POA: Insufficient documentation

## 2016-03-20 DIAGNOSIS — N920 Excessive and frequent menstruation with regular cycle: Secondary | ICD-10-CM | POA: Diagnosis present

## 2016-03-20 DIAGNOSIS — Z87891 Personal history of nicotine dependence: Secondary | ICD-10-CM | POA: Diagnosis not present

## 2016-03-20 DIAGNOSIS — N946 Dysmenorrhea, unspecified: Secondary | ICD-10-CM | POA: Diagnosis not present

## 2016-03-20 HISTORY — DX: Other complications of anesthesia, initial encounter: T88.59XA

## 2016-03-20 HISTORY — DX: Adverse effect of unspecified anesthetic, initial encounter: T41.45XA

## 2016-03-20 HISTORY — PX: HYSTEROSCOPY WITH NOVASURE: SHX5574

## 2016-03-20 LAB — CBC
HCT: 46.6 % — ABNORMAL HIGH (ref 36.0–46.0)
Hemoglobin: 16 g/dL — ABNORMAL HIGH (ref 12.0–15.0)
MCH: 32 pg (ref 26.0–34.0)
MCHC: 34.3 g/dL (ref 30.0–36.0)
MCV: 93.2 fL (ref 78.0–100.0)
PLATELETS: 240 10*3/uL (ref 150–400)
RBC: 5 MIL/uL (ref 3.87–5.11)
RDW: 13.2 % (ref 11.5–15.5)
WBC: 10.4 10*3/uL (ref 4.0–10.5)

## 2016-03-20 LAB — TYPE AND SCREEN
ABO/RH(D): O NEG
Antibody Screen: NEGATIVE

## 2016-03-20 SURGERY — HYSTEROSCOPY WITH NOVASURE
Anesthesia: General | Site: Vagina

## 2016-03-20 MED ORDER — LIDOCAINE HCL (CARDIAC) 20 MG/ML IV SOLN
INTRAVENOUS | Status: DC | PRN
  Administered 2016-03-20: 80 mg via INTRAVENOUS

## 2016-03-20 MED ORDER — DEXAMETHASONE SODIUM PHOSPHATE 10 MG/ML IJ SOLN
INTRAMUSCULAR | Status: DC | PRN
  Administered 2016-03-20: 4 mg via INTRAVENOUS

## 2016-03-20 MED ORDER — SCOPOLAMINE 1 MG/3DAYS TD PT72
1.0000 | MEDICATED_PATCH | Freq: Once | TRANSDERMAL | Status: DC
  Administered 2016-03-20: 1.5 mg via TRANSDERMAL

## 2016-03-20 MED ORDER — LACTATED RINGERS IR SOLN
Status: DC | PRN
  Administered 2016-03-20: 3000 mL

## 2016-03-20 MED ORDER — ONDANSETRON HCL 4 MG/2ML IJ SOLN
INTRAMUSCULAR | Status: DC | PRN
  Administered 2016-03-20: 4 mg via INTRAVENOUS

## 2016-03-20 MED ORDER — MIDAZOLAM HCL 2 MG/2ML IJ SOLN
INTRAMUSCULAR | Status: DC | PRN
  Administered 2016-03-20: 2 mg via INTRAVENOUS

## 2016-03-20 MED ORDER — SCOPOLAMINE 1 MG/3DAYS TD PT72
MEDICATED_PATCH | TRANSDERMAL | Status: AC
  Administered 2016-03-20: 1.5 mg via TRANSDERMAL
  Filled 2016-03-20: qty 1

## 2016-03-20 MED ORDER — LACTATED RINGERS IV SOLN
INTRAVENOUS | Status: DC
  Administered 2016-03-20 (×2): via INTRAVENOUS

## 2016-03-20 MED ORDER — KETOROLAC TROMETHAMINE 30 MG/ML IJ SOLN
INTRAMUSCULAR | Status: DC | PRN
  Administered 2016-03-20: 30 mg via INTRAVENOUS

## 2016-03-20 MED ORDER — LACTATED RINGERS IV SOLN: INTRAVENOUS | Status: DC

## 2016-03-20 MED ORDER — FENTANYL CITRATE (PF) 100 MCG/2ML IJ SOLN
INTRAMUSCULAR | Status: AC
  Administered 2016-03-20: 40 ug via INTRAVENOUS
  Filled 2016-03-20: qty 2

## 2016-03-20 MED ORDER — CEFAZOLIN SODIUM-DEXTROSE 2-4 GM/100ML-% IV SOLN
2.0000 g | INTRAVENOUS | Status: AC
  Administered 2016-03-20: 2 g via INTRAVENOUS

## 2016-03-20 MED ORDER — LIDOCAINE HCL 1 % IJ SOLN
INTRAMUSCULAR | Status: DC | PRN
  Administered 2016-03-20: 10 mL

## 2016-03-20 MED ORDER — FENTANYL CITRATE (PF) 100 MCG/2ML IJ SOLN
INTRAMUSCULAR | Status: AC
  Administered 2016-03-20: 50 ug via INTRAVENOUS
  Filled 2016-03-20: qty 2

## 2016-03-20 MED ORDER — CEFAZOLIN SODIUM-DEXTROSE 2-3 GM-% IV SOLR
INTRAVENOUS | Status: AC
  Filled 2016-03-20: qty 50

## 2016-03-20 MED ORDER — FENTANYL CITRATE (PF) 100 MCG/2ML IJ SOLN
25.0000 ug | INTRAMUSCULAR | Status: DC | PRN
Start: 2016-03-20 — End: 2016-03-20
  Administered 2016-03-20 (×2): 50 ug via INTRAVENOUS
  Administered 2016-03-20: 40 ug via INTRAVENOUS
  Administered 2016-03-20: 50 ug via INTRAVENOUS

## 2016-03-20 MED ORDER — LIDOCAINE HCL (CARDIAC) 20 MG/ML IV SOLN
INTRAVENOUS | Status: AC
  Filled 2016-03-20: qty 5

## 2016-03-20 MED ORDER — PROPOFOL 10 MG/ML IV BOLUS
INTRAVENOUS | Status: AC
  Filled 2016-03-20: qty 20

## 2016-03-20 MED ORDER — LIDOCAINE HCL 1 % IJ SOLN
INTRAMUSCULAR | Status: AC
  Filled 2016-03-20: qty 20

## 2016-03-20 MED ORDER — ONDANSETRON HCL 4 MG/2ML IJ SOLN
INTRAMUSCULAR | Status: AC
  Filled 2016-03-20: qty 2

## 2016-03-20 MED ORDER — FENTANYL CITRATE (PF) 100 MCG/2ML IJ SOLN
INTRAMUSCULAR | Status: AC
  Filled 2016-03-20: qty 2

## 2016-03-20 MED ORDER — FENTANYL CITRATE (PF) 100 MCG/2ML IJ SOLN
INTRAMUSCULAR | Status: DC | PRN
Start: 2016-03-20 — End: 2016-03-20
  Administered 2016-03-20 (×2): 50 ug via INTRAVENOUS

## 2016-03-20 MED ORDER — PROPOFOL 10 MG/ML IV BOLUS
INTRAVENOUS | Status: DC | PRN
  Administered 2016-03-20: 170 mg via INTRAVENOUS

## 2016-03-20 MED ORDER — DEXAMETHASONE SODIUM PHOSPHATE 4 MG/ML IJ SOLN
INTRAMUSCULAR | Status: AC
Start: 2016-03-20 — End: 2016-03-20
  Filled 2016-03-20: qty 1

## 2016-03-20 MED ORDER — KETOROLAC TROMETHAMINE 30 MG/ML IJ SOLN
INTRAMUSCULAR | Status: AC
  Filled 2016-03-20: qty 1

## 2016-03-20 MED ORDER — MIDAZOLAM HCL 2 MG/2ML IJ SOLN
INTRAMUSCULAR | Status: AC
  Filled 2016-03-20: qty 2

## 2016-03-20 SURGICAL SUPPLY — 13 items
ABLATOR ENDOMETRIAL BIPOLAR (ABLATOR) ×2 IMPLANT
CATH ROBINSON RED A/P 16FR (CATHETERS) ×2 IMPLANT
CLOTH BEACON ORANGE TIMEOUT ST (SAFETY) ×2 IMPLANT
CONTAINER PREFILL 10% NBF 60ML (FORM) ×2 IMPLANT
GLOVE BIOGEL PI IND STRL 7.0 (GLOVE) ×1 IMPLANT
GLOVE BIOGEL PI INDICATOR 7.0 (GLOVE) ×1
GLOVE ECLIPSE 7.0 STRL STRAW (GLOVE) ×4 IMPLANT
GOWN STRL REUS W/TWL LRG LVL3 (GOWN DISPOSABLE) ×4 IMPLANT
PACK VAGINAL MINOR WOMEN LF (CUSTOM PROCEDURE TRAY) ×2 IMPLANT
PAD OB MATERNITY 4.3X12.25 (PERSONAL CARE ITEMS) ×2 IMPLANT
TOWEL OR 17X24 6PK STRL BLUE (TOWEL DISPOSABLE) ×4 IMPLANT
TUBING AQUILEX INFLOW (TUBING) ×2 IMPLANT
WATER STERILE IRR 1000ML POUR (IV SOLUTION) ×2 IMPLANT

## 2016-03-20 NOTE — H&P (Signed)
Pt is a 30 y/o white female who presents to the OR for a hysteroscopy/ removal of polyp/ Ablation secondary to menorrhagia and a likely polyp.  Chief Complaint: HPI:  Past Medical History  Diagnosis Date  . Depression   . Anxiety   . Psoriatic arthritis (HCC)   . Arthritis   . Kidney stones   . Complication of anesthesia     "wakes up during anesthesia"    Past Surgical History  Procedure Laterality Date  . Tonsillectomy  2004  . Cholecystectomy  11/12/2011    Procedure: LAPAROSCOPIC CHOLECYSTECTOMY;  Surgeon: Atilano InaEric M Wilson, MD;  Location: [facility];  Service: General;  Laterality: N/A;  . Tubal ligation Bilateral 09/10/2015    Procedure: POST PARTUM TUBAL LIGATION;  Surgeon: Carrington ClampMichelle Horvath, MD;  Location: [facility];  Service: Gynecology;  Laterality: Bilateral;    Family History  Problem Relation Age of Onset  . Cancer Paternal Grandfather     bladder   Social History:  reports that she has quit smoking. She has never used smokeless tobacco. She reports that she does not drink alcohol or use illicit drugs.  Allergies:  Allergies  Allergen Reactions  . Shrimp [Shellfish Allergy] Swelling    Medications Prior to Admission  Medication Sig Dispense Refill  . Certolizumab Pegol (CIMZIA Ada) Inject 1 Dose into the skin daily. autoinject    . HYDROcodone-acetaminophen (NORCO/VICODIN) 5-325 MG tablet Take 1 tablet by mouth every 6 (six) hours as needed for moderate pain.    . predniSONE (DELTASONE) 20 MG tablet Take 20 mg by mouth daily with breakfast.    . oxyCODONE-acetaminophen (PERCOCET/ROXICET) 5-325 MG per tablet Take 1 tablet by mouth every 4 (four) hours as needed (for pain scale 4-7). (Patient not taking: Reported on 03/06/2016) 30 tablet 0  . oxyCODONE-acetaminophen (ROXICET) 5-325 MG per tablet Take 1-2 tablets by mouth every 4 (four) hours as needed for severe pain. (Patient not taking: Reported on 03/06/2016) 10 tablet 0       Blood pressure 124/91, pulse 88,  temperature 98.2 F (36.8 C), temperature source Oral, resp. rate 18, height 5\' 8"  (1.727 m), weight 170 lb (77.111 kg), SpO2 99 %, unknown if currently breastfeeding. General appearance: alert Lungs: clear to auscultation bilaterally Abdomen: soft, non-tender; bowel sounds normal; no masses,  no organomegaly Pelvic: cervix normal in appearance, external genitalia normal, no adnexal masses or tenderness, no cervical motion tenderness, rectovaginal septum normal, uterus normal size, shape, and consistency and vagina normal without discharge   Lab Results  Component Value Date   WBC 10.4 03/20/2016   HGB 16.0* 03/20/2016   HCT 46.6* 03/20/2016   MCV 93.2 03/20/2016   PLT 240 03/20/2016   Lab Results  Component Value Date   PREGTESTUR POSITIVE* 01/31/2015   PREGSERUM NEGATIVE 11/12/2011       Patient Active Problem List   Diagnosis Date Noted  . Active labor at term 09/09/2015  . Postpartum state 09/09/2015  . Gestational hypertension, antepartum 08/23/2015  . Pregnancy with nephrolithiasis in second trimester 06/22/2015  . Pyelonephritis affecting pregnancy in second trimester, antepartum 04/21/2015   IMP/ Menorrhgia/polyp Plan/ proceed with hysteroscopy/resection of polyp/ablation  ANDERSON,MARK E 03/20/2016, 11:50 AM

## 2016-03-20 NOTE — Anesthesia Procedure Notes (Signed)
Procedure Name: LMA Insertion Date/Time: 03/20/2016 12:03 PM Performed by: Yolonda KidaARVER, Stephens Shreve L Pre-anesthesia Checklist: Patient identified, Patient being monitored, Emergency Drugs available and Suction available Patient Re-evaluated:Patient Re-evaluated prior to inductionOxygen Delivery Method: Circle system utilized Preoxygenation: Pre-oxygenation with 100% oxygen Intubation Type: IV induction LMA: LMA inserted LMA Size: 4.0 Number of attempts: 1 Placement Confirmation: positive ETCO2,  CO2 detector and breath sounds checked- equal and bilateral Tube secured with: Tape Dental Injury: Teeth and Oropharynx as per pre-operative assessment

## 2016-03-20 NOTE — Transfer of Care (Signed)
Immediate Anesthesia Transfer of Care Note  Patient: Brenda Webb  Procedure(s) Performed: Procedure(s): HYSTEROSCOPY WITH NOVASURE (N/A)  Patient Location: PACU  Anesthesia Type:General  Level of Consciousness: awake, alert , oriented and patient cooperative  Airway & Oxygen Therapy: Patient Spontanous Breathing and Patient connected to nasal cannula oxygen  Post-op Assessment: Report given to RN and Post -op Vital signs reviewed and stable  Post vital signs: Reviewed and stable  Last Vitals:  Filed Vitals:   03/20/16 1024 03/20/16 1235  BP: 124/91 135/78  Pulse: 88 72  Temp: 36.8 C 36.7 C  Resp: 18 20    Complications: No apparent anesthesia complications

## 2016-03-20 NOTE — Anesthesia Postprocedure Evaluation (Signed)
Anesthesia Post Note  Patient: Brenda Webb  Procedure(s) Performed: Procedure(s) (LRB): Dilatation and Currettage, HYSTEROSCOPY WITH NOVASURE (N/A)  Patient location during evaluation: PACU Anesthesia Type: General Level of consciousness: awake and alert Pain management: pain level controlled Vital Signs Assessment: post-procedure vital signs reviewed and stable Respiratory status: spontaneous breathing, nonlabored ventilation, respiratory function stable and patient connected to nasal cannula oxygen Cardiovascular status: blood pressure returned to baseline and stable Postop Assessment: no signs of nausea or vomiting Anesthetic complications: no    Last Vitals:  Filed Vitals:   03/20/16 1300 03/20/16 1315  BP: 145/93 128/78  Pulse: 61 60  Temp:    Resp: 16 18    Last Pain:  Filed Vitals:   03/20/16 1327  PainSc: 5                  Jonatha Gagen L

## 2016-03-20 NOTE — Discharge Instructions (Signed)
DISCHARGE INSTRUCTIONS: HYSTEROSCOPY / ENDOMETRIAL ABLATION The following instructions have been prepared to help you care for yourself upon your return home.  May Remove Scop patch on or before  May take Ibuprofen after  May take stool softner while taking narcotic pain medication to prevent constipation.  Drink plenty of water.  Personal hygiene:  Use sanitary pads for vaginal drainage, not tampons.  Shower the day after your procedure.  NO tub baths, pools or Jacuzzis for 2-3 weeks.  Wipe front to back after using the bathroom.  Activity and limitations:  Do NOT drive or operate any equipment for 24 hours. The effects of anesthesia are still present and drowsiness may result.  Do NOT rest in bed all day.  Walking is encouraged.  Walk up and down stairs slowly.  You may resume your normal activity in one to two days or as indicated by your physician. Sexual activity: NO intercourse for at least 2 weeks after the procedure, or as indicated by your Doctor.  Diet: Eat a light meal as desired this evening. You may resume your usual diet tomorrow.  Return to Work: You may resume your work activities in one to two days or as indicated by Therapist, sportsyour Doctor.  What to expect after your surgery: Expect to have vaginal bleeding/discharge for 2-3 days and spotting for up to 10 days. It is not unusual to have soreness for up to 1-2 weeks. You may have a slight burning sensation when you urinate for the first day. Mild cramps may continue for a couple of days. You may have a regular period in 2-6 weeks.  Call your doctor for any of the following:  Excessive vaginal bleeding or clotting, saturating and changing one pad every hour.  Inability to urinate 6 hours after discharge from hospital.  Pain not relieved by pain medication.  Fever of 100.4 F or greater.  Unusual vaginal discharge or odor.  Can take ibuprofen/motrin/advil at  [facility]6PM Can use a heating pad to abdomen Increase  water intake next 48hours  Return to office _________________Call for an appointment ___________________ Patients signature: ______________________ Nurses signature ________________________  Post Anesthesia Care Unit [phone]

## 2016-03-20 NOTE — Anesthesia Preprocedure Evaluation (Addendum)
Anesthesia Evaluation  Patient identified by MRN, date of birth, ID band Patient awake    Reviewed: Allergy & Precautions, H&P , NPO status , Patient's Chart, lab work & pertinent test results  History of Anesthesia Complications (+) AWARENESS UNDER ANESTHESIA  Airway Mallampati: II  TM Distance: >3 FB Neck ROM: full    Dental no notable dental hx. (+) Dental Advisory Given, Teeth Intact   Pulmonary neg pulmonary ROS, former smoker,    Pulmonary exam normal breath sounds clear to auscultation       Cardiovascular Exercise Tolerance: Good negative cardio ROS Normal cardiovascular exam Rhythm:regular Rate:Normal     Neuro/Psych negative neurological ROS  negative psych ROS   GI/Hepatic negative GI ROS, Neg liver ROS,   Endo/Other  negative endocrine ROS  Renal/GU negative Renal ROS  negative genitourinary   Musculoskeletal   Abdominal   Peds  Hematology negative hematology ROS (+)   Anesthesia Other Findings   Reproductive/Obstetrics negative OB ROS                            Anesthesia Physical Anesthesia Plan  ASA: I  Anesthesia Plan: General   Post-op Pain Management:    Induction: Intravenous  Airway Management Planned: LMA  Additional Equipment:   Intra-op Plan:   Post-operative Plan:   Informed Consent: I have reviewed the patients History and Physical, chart, labs and discussed the procedure including the risks, benefits and alternatives for the proposed anesthesia with the patient or authorized representative who has indicated his/her understanding and acceptance.   Dental Advisory Given  Plan Discussed with: CRNA  Anesthesia Plan Comments:        Anesthesia Quick Evaluation

## 2016-03-21 ENCOUNTER — Encounter (HOSPITAL_COMMUNITY): Payer: Self-pay | Admitting: Obstetrics and Gynecology

## 2016-03-21 NOTE — Op Note (Signed)
Brenda Webb, Brenda                 ACCOUNT NO.:  [id number]  MEDICAL RECORD NO.:  [id number]  LOCATION:  [facility]                          FACILITY:  [facility]  PHYSICIAN:  Malva LimesMark Jaran Sainz, M.D.    DATE OF BIRTH:  11/10/1986  DATE OF PROCEDURE: DATE OF DISCHARGE:  03/20/2016                              OPERATIVE REPORT   PREOPERATIVE DIAGNOSES: 1. Menorrhagia. 2. Suspected endometrial polyp.  POSTOPERATIVE DIAGNOSES: 1. Menorrhagia. 2. Suspected endometrial polyp with no evidence of endometrial polyp.  SURGEON:  Malva LimesMark Steffany Schoenfelder, MD  ANESTHESIA:  General and local.  ANTIBIOTICS:  Ancef 2 g.  DRAINS:  Red rubber catheter bladder.  SPECIMENS:  Endometrial curettings sent to Pathology.  COMPLICATIONS:  None.  ESTIMATED BLOOD LOSS:  20 mL.  FINDINGS:  The patient had normal endocervical canal.  On entering the uterine cavity, there was no evidence of any polyps throughout the cavity.  Both ostia were easily visualized.  PROCEDURE IN DETAIL:  The patient was taken to the operating room, where general anesthetic was administered without difficulty.  She was then placed in dorsal lithotomy position.  She was prepped and draped in the usual fashion for this procedure.  Her bladder was drained with a red rubber catheter.  A sterile speculum was placed in the vagina.  10 mL of 1% lidocaine was used for paracervical block.  A single-tooth tenaculum applied to the anterior cervical lip.  The cervix was then serially dilated to a 27-French.  The hysteroscope was advanced through the endocervical canal and findings were as noted above.  At this point, the hysteroscope was removed.  Sharp curettage was performed and tissue sent to Pathology.  The uterus was sounded to 8 cm.  The cervical length was 2.5 cm giving a cavity length of 5.5 cm.  The NovaSure device was placed into the uterine cavity and opened.  The width was 4.4 cm.  A seal test as [facility] was performed and passed.  Once this  was done, the device was turned on for 1 minute and giving 133 watts of power.  Device was removed.  The hysteroscope was placed back into the abdominal cavity and it appeared that there was a very good burn.  At this point, procedure was concluded.  The patient was awoken and taken to recovery room.  She will be discharged home.  She will be sent home with Motrin to take as needed.  She will follow up in the office in 4 weeks.          ______________________________ Malva LimesMark Lazarius Rivkin, M.D.     MA/MEDQ  D:  03/20/2016  T:  03/21/2016  Job:  [id number]

## 2016-05-19 ENCOUNTER — Encounter (HOSPITAL_BASED_OUTPATIENT_CLINIC_OR_DEPARTMENT_OTHER): Payer: Self-pay | Admitting: Emergency Medicine

## 2016-05-19 ENCOUNTER — Emergency Department (HOSPITAL_BASED_OUTPATIENT_CLINIC_OR_DEPARTMENT_OTHER)
Admission: EM | Admit: 2016-05-19 | Discharge: 2016-05-20 | Disposition: A | Discharge status: Home / Self Care | Payer: BLUE CROSS/BLUE SHIELD | Attending: Emergency Medicine | Admitting: Emergency Medicine

## 2016-05-19 DIAGNOSIS — Z87891 Personal history of nicotine dependence: Secondary | ICD-10-CM | POA: Insufficient documentation

## 2016-05-19 DIAGNOSIS — M1990 Unspecified osteoarthritis, unspecified site: Secondary | ICD-10-CM | POA: Diagnosis not present

## 2016-05-19 DIAGNOSIS — M791 Myalgia: Secondary | ICD-10-CM | POA: Diagnosis not present

## 2016-05-19 DIAGNOSIS — M25551 Pain in right hip: Secondary | ICD-10-CM | POA: Diagnosis present

## 2016-05-19 DIAGNOSIS — F329 Major depressive disorder, single episode, unspecified: Secondary | ICD-10-CM | POA: Diagnosis not present

## 2016-05-19 DIAGNOSIS — M25552 Pain in left hip: Secondary | ICD-10-CM | POA: Diagnosis not present

## 2016-05-19 DIAGNOSIS — M25559 Pain in unspecified hip: Secondary | ICD-10-CM

## 2016-05-19 DIAGNOSIS — L4050 Arthropathic psoriasis, unspecified: Secondary | ICD-10-CM | POA: Insufficient documentation

## 2016-05-19 MED ORDER — OXYCODONE-ACETAMINOPHEN 5-325 MG PO TABS
1.0000 | ORAL_TABLET | Freq: Once | ORAL | Status: AC
  Administered 2016-05-19: 1 via ORAL
  Filled 2016-05-19: qty 1

## 2016-05-19 MED ORDER — KETOROLAC TROMETHAMINE 60 MG/2ML IM SOLN
60.0000 mg | Freq: Once | INTRAMUSCULAR | Status: AC
  Administered 2016-05-19: 60 mg via INTRAMUSCULAR
  Filled 2016-05-19: qty 2

## 2016-05-19 NOTE — ED Notes (Signed)
Pt has psoriatic arthritis and reports having a flare up. Pt c/o bilateral hip pain worse on left side.

## 2016-05-19 NOTE — ED Provider Notes (Signed)
CSN: [id number]     Arrival date & time 05/19/16  2048 History  By signing my name below, I, Hollace Hayward, attest that this documentation has been prepared under the direction and in the presence of Xylina Rhoads, MD.  Electronically Signed: Hollace Hayward, ED Scribe. 05/19/2016. 11:03 PM.  Chief Complaint  Patient presents with  . Hip Pain   Patient is a 30 y.o. female presenting with hip pain. The history is provided by the patient.  Hip Pain This is a chronic problem. The current episode started more than 1 week ago. The problem occurs constantly. The problem has not changed since onset.Pertinent negatives include no chest pain, no abdominal pain, no headaches and no shortness of breath. Nothing relieves the symptoms. Treatments tried: all her meds including lyrica, has been on chronic narcotics. The treatment provided no relief.   HPI Comments: Brenda Webb is a 30 y.o. female with a PMHx of Psoriatic Arthritis who presents to the Emergency Department complaining of gradually worsening, constant bilateral hip pain that began PTA.  Pt states "that she is currently having a flare up". Pt is currently between Rhuematolgists. Pt is currently taking  of Prednisone. She is also on methotrexate, Lyrica, and Cimzia, and Volteron BID. No alleviating factors noted. Pt currently is under a pain contract.  Past Medical History  Diagnosis Date  . Depression   . Anxiety   . Psoriatic arthritis (HCC)   . Arthritis   . Kidney stones   . Complication of anesthesia     "wakes up during anesthesia"   Past Surgical History  Procedure Laterality Date  . Tonsillectomy  2004  . Cholecystectomy  11/12/2011    Procedure: LAPAROSCOPIC CHOLECYSTECTOMY;  Surgeon: Atilano Ina, MD;  Location: [facility];  Service: General;  Laterality: N/A;  . Tubal ligation Bilateral 09/10/2015    Procedure: POST PARTUM TUBAL LIGATION;  Surgeon: Carrington Clamp, MD;  Location: [facility];  Service: Gynecology;  Laterality:  Bilateral;  . Hysteroscopy with novasure N/A 03/20/2016    Procedure: Dilatation and Currettage, HYSTEROSCOPY WITH NOVASURE;  Surgeon: Levi Aland, MD;  Location: [facility];  Service: Gynecology;  Laterality: N/A;   Family History  Problem Relation Age of Onset  . Cancer Paternal Grandfather     bladder   Social History  Substance Use Topics  . Smoking status: Former Games developer  . Smokeless tobacco: Never Used  . Alcohol Use: No   OB History    Gravida Para Term Preterm AB TAB SAB Ectopic Multiple Living   0 1 0 1 0 0 1     Review of Systems  Constitutional: Negative for fever.  Respiratory: Negative for shortness of breath.   Cardiovascular: Negative for chest pain.  Gastrointestinal: Negative for abdominal pain.  Musculoskeletal: Positive for myalgias and arthralgias.  Neurological: Negative for numbness and headaches.  All other systems reviewed and are negative.  Allergies  Shrimp  Home Medications   Prior to Admission medications   Medication Sig Start Date End Date Taking? Authorizing Provider  Certolizumab Pegol (CIMZIA [place]) Inject 1 Dose into the skin daily. autoinject    Historical Provider, MD  HYDROcodone-acetaminophen (NORCO/VICODIN) 5-325 MG tablet Take 1 tablet by mouth every 6 (six) hours as needed for moderate pain.    Historical Provider, MD  predniSONE (DELTASONE) 20 MG tablet Take 20 mg by mouth daily with breakfast.    Historical Provider, MD   BP 126/88 mmHg  Pulse 90  Temp(Src) 98.6 F (  37 C) (Oral)  Resp 18  Ht 5\' 8"  (1.727 m)  Wt 170 lb (77.111 kg)  BMI 25.85 kg/m2  SpO2 100%   Physical Exam  Constitutional: She is oriented to person, place, and time. She appears well-developed and well-nourished.  HENT:  Head: Normocephalic and atraumatic.  Mouth/Throat: Oropharynx is clear and moist and mucous membranes are normal. No oropharyngeal exudate.  Moist mucus membranes.   Eyes: Conjunctivae and EOM are normal. Pupils are equal, round, and  reactive to light.  Neck: Normal range of motion. Neck supple. No JVD present. No tracheal deviation present.  Cardiovascular: Normal rate, regular rhythm, normal heart sounds and intact distal pulses.  Exam reveals no gallop and no friction rub.   No murmur heard. RRR.   Pulmonary/Chest: Effort normal and breath sounds normal. No stridor. No respiratory distress. She has no wheezes. She has no rales.  Lungs CTA bilaterally.   Abdominal: Soft. Bowel sounds are normal. She exhibits no distension. There is no tenderness. There is no rebound and no guarding.  Musculoskeletal: Normal range of motion.  Lymphadenopathy:    She has no cervical adenopathy.  Neurological: She is alert and oriented to person, place, and time. She has normal reflexes.  Skin: Skin is warm and dry.  Psychiatric: She has a normal mood and affect.  Nursing note and vitals reviewed.   ED Course  Procedures (including critical care time) DIAGNOSTIC STUDIES: Oxygen Saturation is 100% on RA, normal by my interpretation.   COORDINATION OF CARE: 11:06 PM-Discussed next steps with pt including a dosage of Percocet and a shot of Toradol. Pt verbalized understanding and is agreeable with the plan.   Labs Review Labs Reviewed - No data to display  Imaging Review No results found. I have personally reviewed and evaluated these images and lab results as part of my medical decision-making.   EKG Interpretation None      MDM   Final diagnoses:  None   Filed Vitals:   05/20/16 0026 05/20/16 0027  BP:  114/79  Pulse:  73  Temp:    Resp: 16     Medications  ketorolac (TORADOL) injection 60 mg (60 mg Intramuscular Given 05/19/16 2324)  oxyCODONE-acetaminophen (PERCOCET/ROXICET) 5-325 MG per tablet 1 tablet (1 tablet Oral Given 05/19/16 2322)    Given this is chronic and patient sees a physician this with multiple recent RX for narcotics.  We do not refill narcotic pain meds.     I personally performed the  services described in this documentation, which was scribed in my presence. The recorded information has been reviewed and is accurate.       Cy BlamerApril Faithann Natal, MD 05/20/16 [phone]

## 2016-05-20 ENCOUNTER — Encounter (HOSPITAL_BASED_OUTPATIENT_CLINIC_OR_DEPARTMENT_OTHER): Payer: Self-pay | Admitting: Emergency Medicine

## 2016-05-20 MED ORDER — LIDOCAINE 5 % EX PTCH: 1.0000 | MEDICATED_PATCH | CUTANEOUS | Status: DC

## 2016-05-20 NOTE — ED Notes (Signed)
Pt made aware to return if symptoms worsen or if any life threatening symptoms occur.   

## 2016-07-03 IMAGING — US US RENAL
1 series · 14 of 25 positions shown · non-contrast
Comparison: 12/01/2014

CLINICAL DATA: Left flank pain.  Second trimester pregnancy.

EXAM:
RENAL / URINARY TRACT ULTRASOUND COMPLETE

[Series 1: us renal · 14 of 30 slices shown]
[im 1/30]
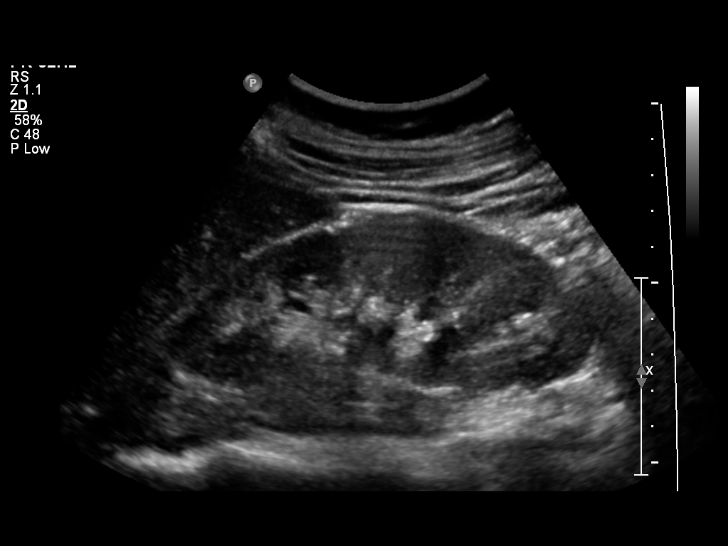
[im 3/30]
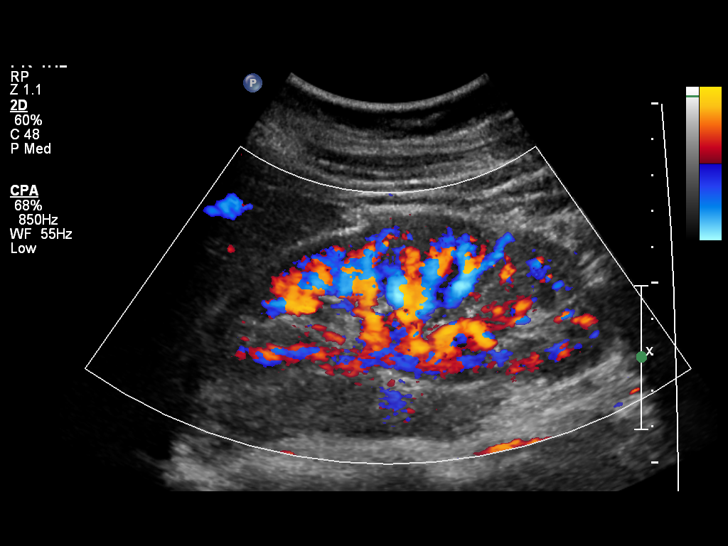
[im 5/30]
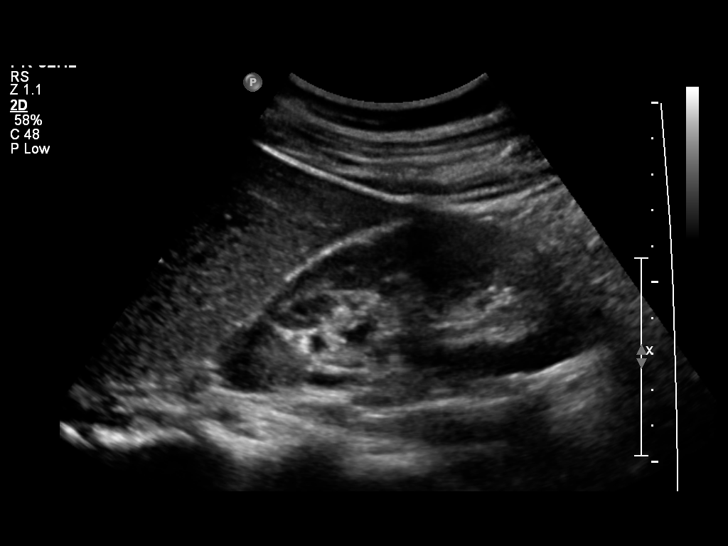
[im 8/30]
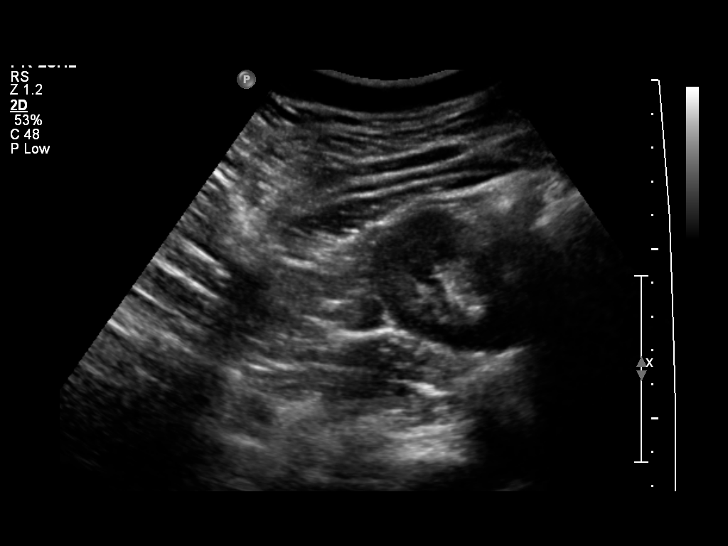
[im 10/30]
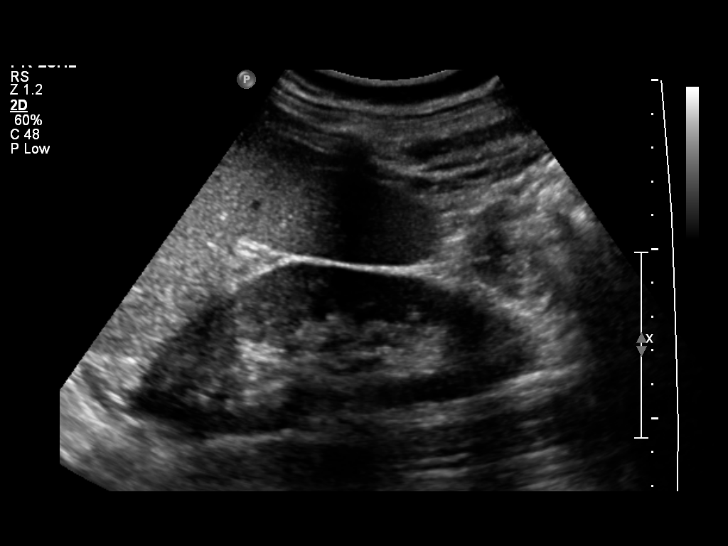
[im 11/30]
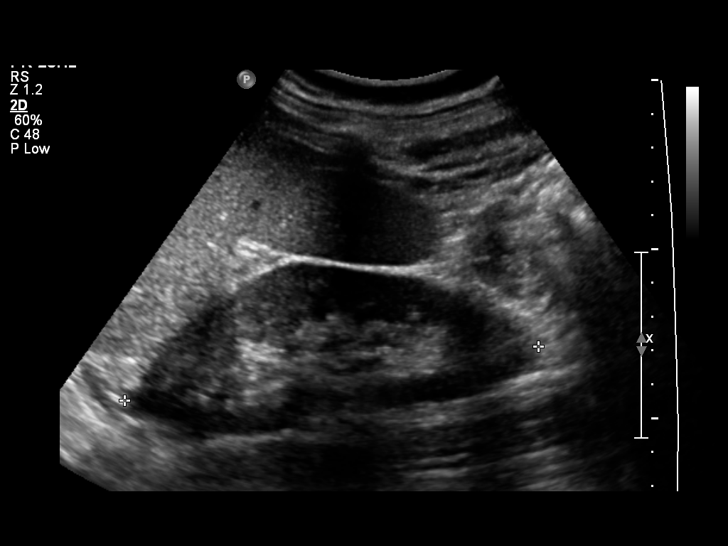
[im 14/30]
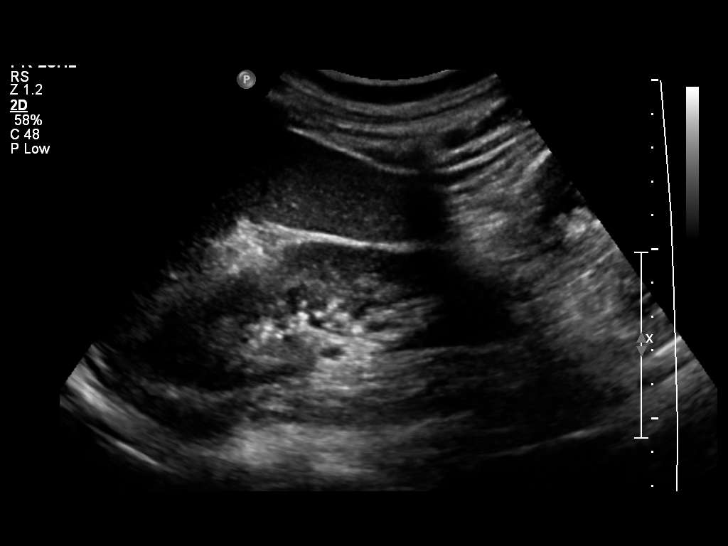
[im 16/30]
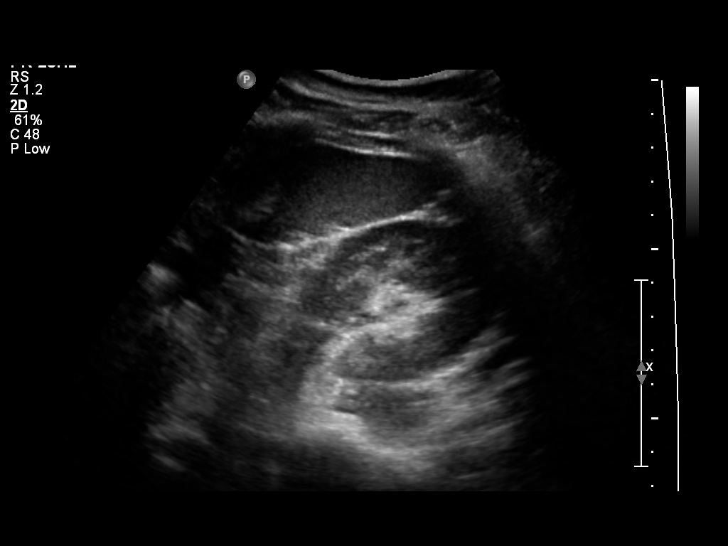
[im 19/30]
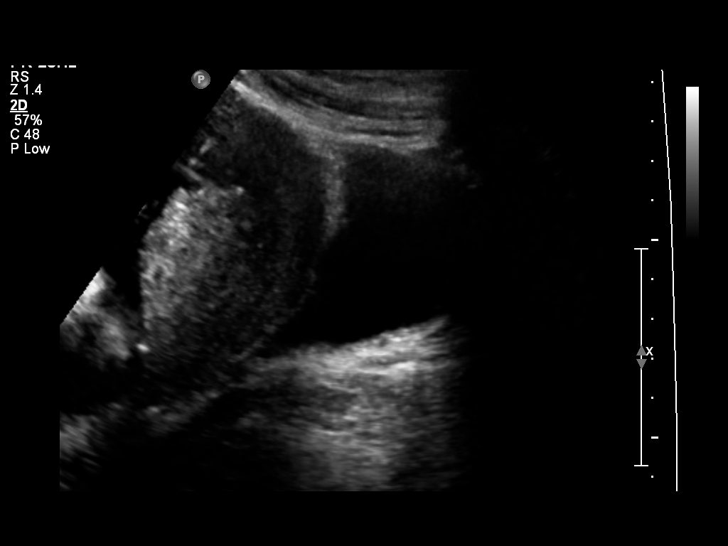
[im 20/30]
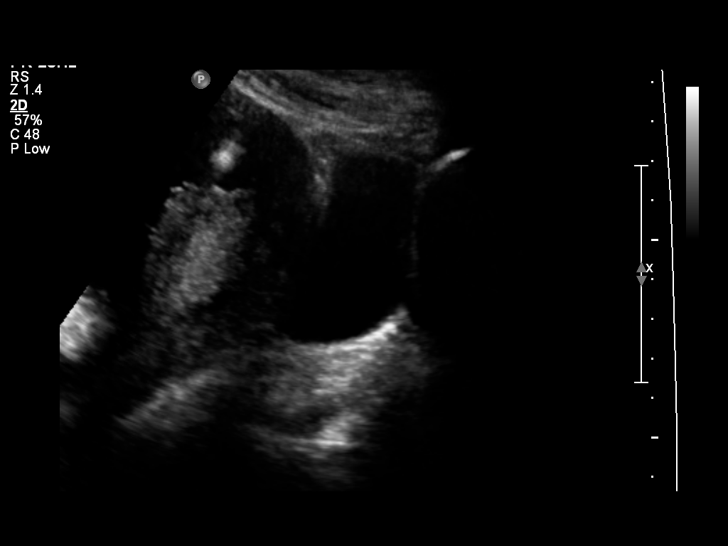
[im 22/30]
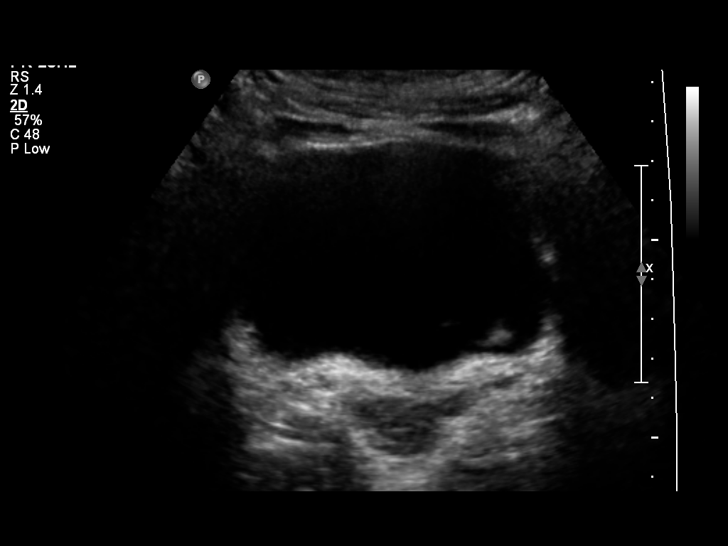
[im 25/30]
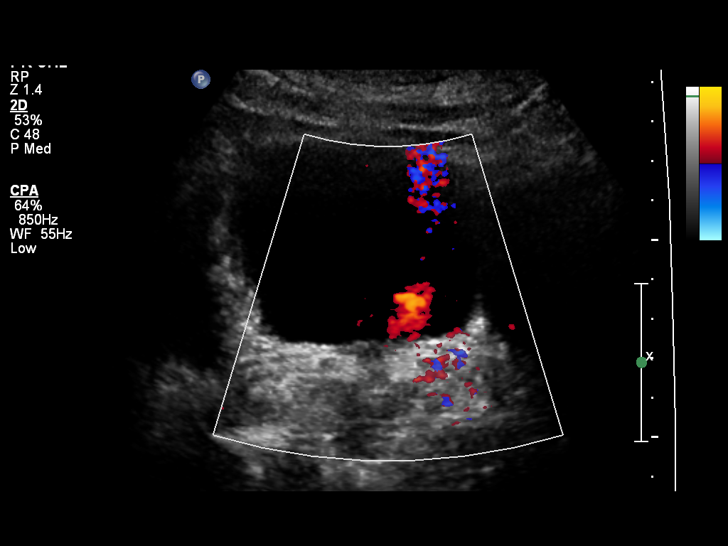
[im 27/30]
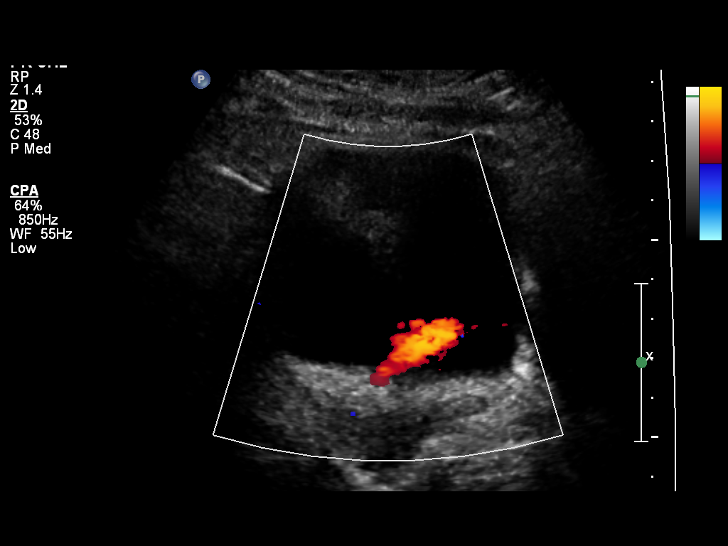
[im 30/30]
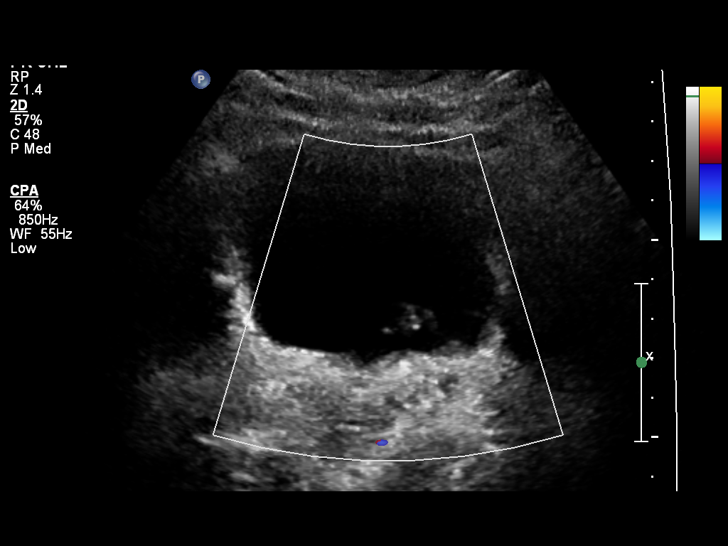

[14 of 25 positions shown; findings below may reference images not displayed]

FINDINGS: Right Kidney:

Length: 12.0 cm. Echogenicity within normal limits. No mass or
hydronephrosis visualized.

Left Kidney:

Length: 12.3 cm. Echogenicity within normal limits. No mass or
hydronephrosis visualized.

Bladder:

Bilateral ureteral jets noted. There is a small amount of debris in
the urinary bladder appreciated eccentric to the left side.
IMPRESSION: 1. Small amount of debris in the urinary bladder, potentially
sediment. Correlate with urine analysis.
2. No renal calculi identified.  No hydronephrosis.

## 2016-07-15 ENCOUNTER — Encounter (HOSPITAL_COMMUNITY): Payer: Self-pay | Admitting: Emergency Medicine

## 2016-07-15 ENCOUNTER — Emergency Department (HOSPITAL_COMMUNITY)
Admission: EM | Admit: 2016-07-15 | Discharge: 2016-07-15 | Disposition: A | Discharge status: Home / Self Care | Payer: BLUE CROSS/BLUE SHIELD | Attending: Emergency Medicine | Admitting: Emergency Medicine

## 2016-07-15 ENCOUNTER — Emergency Department (HOSPITAL_COMMUNITY): Payer: BLUE CROSS/BLUE SHIELD

## 2016-07-15 DIAGNOSIS — S70312A Abrasion, left thigh, initial encounter: Secondary | ICD-10-CM | POA: Diagnosis not present

## 2016-07-15 DIAGNOSIS — W01118A Fall on same level from slipping, tripping and stumbling with subsequent striking against other sharp object, initial encounter: Secondary | ICD-10-CM | POA: Insufficient documentation

## 2016-07-15 DIAGNOSIS — S3992XA Unspecified injury of lower back, initial encounter: Secondary | ICD-10-CM | POA: Diagnosis present

## 2016-07-15 DIAGNOSIS — Y929 Unspecified place or not applicable: Secondary | ICD-10-CM | POA: Insufficient documentation

## 2016-07-15 DIAGNOSIS — S70311A Abrasion, right thigh, initial encounter: Secondary | ICD-10-CM | POA: Insufficient documentation

## 2016-07-15 DIAGNOSIS — S70211A Abrasion, right hip, initial encounter: Secondary | ICD-10-CM | POA: Diagnosis not present

## 2016-07-15 DIAGNOSIS — T07XXXA Unspecified multiple injuries, initial encounter: Secondary | ICD-10-CM

## 2016-07-15 DIAGNOSIS — Z87891 Personal history of nicotine dependence: Secondary | ICD-10-CM | POA: Insufficient documentation

## 2016-07-15 DIAGNOSIS — Y9389 Activity, other specified: Secondary | ICD-10-CM | POA: Insufficient documentation

## 2016-07-15 DIAGNOSIS — S30810A Abrasion of lower back and pelvis, initial encounter: Secondary | ICD-10-CM | POA: Insufficient documentation

## 2016-07-15 DIAGNOSIS — Z23 Encounter for immunization: Secondary | ICD-10-CM | POA: Diagnosis not present

## 2016-07-15 DIAGNOSIS — Y999 Unspecified external cause status: Secondary | ICD-10-CM | POA: Diagnosis not present

## 2016-07-15 DIAGNOSIS — Z79899 Other long term (current) drug therapy: Secondary | ICD-10-CM | POA: Diagnosis not present

## 2016-07-15 MED ORDER — IBUPROFEN 400 MG PO TABS
600.0000 mg | ORAL_TABLET | Freq: Once | ORAL | Status: AC
  Administered 2016-07-15: 600 mg via ORAL
  Filled 2016-07-15: qty 2

## 2016-07-15 MED ORDER — ONDANSETRON 4 MG PO TBDP
4.0000 mg | ORAL_TABLET | Freq: Once | ORAL | Status: AC
  Administered 2016-07-15: 4 mg via ORAL
  Filled 2016-07-15: qty 1

## 2016-07-15 MED ORDER — LIDOCAINE HCL (PF) 1 % IJ SOLN
10.0000 mL | Freq: Once | INTRAMUSCULAR | Status: AC
  Administered 2016-07-15: 10 mL
  Filled 2016-07-15: qty 10

## 2016-07-15 MED ORDER — TETANUS-DIPHTH-ACELL PERTUSSIS 5-2.5-18.5 LF-MCG/0.5 IM SUSP
0.5000 mL | Freq: Once | INTRAMUSCULAR | Status: AC
  Administered 2016-07-15: 0.5 mL via INTRAMUSCULAR
  Filled 2016-07-15: qty 0.5

## 2016-07-15 NOTE — ED Triage Notes (Signed)
Pt reports that she slipped in the shower. Pt states she fell through a glass shower door. Pt c/o multiple lacerations and abrasions to RT torso and RT leg. EMS reports pt has two puncture wounds, one to LT foot, and one to RT hip. Bleeding controlled at this time. Denies neck or back pain. Denies LOC. Pt still has some glass on body.

## 2016-07-15 NOTE — ED Notes (Signed)
Pt noted to have large amounts of glass still on her body. Used tape to remove as much as possible. Pt has abrasions to RT hip, thigh, leg, and bilateral feet.

## 2016-07-15 NOTE — Discharge Instructions (Signed)
Suture removal in 7-10 days 

## 2016-07-15 NOTE — ED Provider Notes (Signed)
[facility] Provider Note   CSN: [id number] Arrival date & time: 07/15/16  1411  First Provider Contact:  None       History   Chief Complaint Chief Complaint  Patient presents with  . Fall    HPI Brenda Webb is a 30 y.o. female.  HPI   30 year old female presenting with multiple lacerations and abrasions. She slipped in her shower and a glass shower door broke. It shattered and she sustained multiple abrasions and lacerations from glass. Largest wound is to the right lower back/flank and a wound to her left foot. Bleeding controlled with pressure. No other acute complaints.  Past Medical History:  Diagnosis Date  . Anxiety   . Arthritis   . Complication of anesthesia    "wakes up during anesthesia"  . Depression   . Kidney stones   . Psoriatic arthritis [facility])     Patient Active Problem List   Diagnosis Date Noted  . Active labor at term 09/09/2015  . Postpartum state 09/09/2015  . Gestational hypertension, antepartum 08/23/2015  . Pregnancy with nephrolithiasis in second trimester 06/22/2015  . Pyelonephritis affecting pregnancy in second trimester, antepartum 04/21/2015    Past Surgical History:  Procedure Laterality Date  . CHOLECYSTECTOMY  11/12/2011   Procedure: LAPAROSCOPIC CHOLECYSTECTOMY;  Surgeon: Atilano Ina, MD;  Location: [facility];  Service: General;  Laterality: N/A;  . HYSTEROSCOPY WITH NOVASURE N/A 03/20/2016   Procedure: Dilatation and Currettage, HYSTEROSCOPY WITH NOVASURE;  Surgeon: Levi Aland, MD;  Location: [facility];  Service: Gynecology;  Laterality: N/A;  . TONSILLECTOMY  2004  . TUBAL LIGATION Bilateral 09/10/2015   Procedure: POST PARTUM TUBAL LIGATION;  Surgeon: Carrington Clamp, MD;  Location: [facility];  Service: Gynecology;  Laterality: Bilateral;    OB History    Gravida Para Term Preterm AB Living   3 2 2  0 1 1   SAB TAB Ectopic Multiple Live Births   1 0 0 0         Home Medications    Prior to Admission medications    Medication Sig Start Date End Date Taking? Authorizing Provider  Certolizumab Pegol (CIMZIA [place]) Inject 1 Dose into the skin daily. autoinject    Historical Provider, MD  HYDROcodone-acetaminophen (NORCO/VICODIN) 5-325 MG tablet Take 1 tablet by mouth every 6 (six) hours as needed for moderate pain.    Historical Provider, MD  lidocaine (LIDODERM) 5 % Place 1 patch onto the skin daily. Remove & Discard patch within 12 hours or as directed by MD 05/20/16   April Palumbo, MD  predniSONE (DELTASONE) 20 MG tablet Take 20 mg by mouth daily with breakfast.    Historical Provider, MD    Family History Family History  Problem Relation Age of Onset  . Cancer Paternal Grandfather     bladder    Social History Social History  Substance Use Topics  . Smoking status: Former Games developer  . Smokeless tobacco: Never Used  . Alcohol use No     Allergies   Shrimp [shellfish allergy]   Review of Systems Review of Systems  All systems reviewed and negative, other than as noted in HPI.   Physical Exam Updated Vital Signs BP 130/77 (BP Location: Right Arm)   Pulse 87   Temp 98.9 F (37.2 C) (Oral)   Resp 14   Ht 5\' 8"  (1.727 m)   Wt 165 lb (74.8 kg)   SpO2 99%   BMI 25.09 kg/m   Physical Exam  Constitutional:  She appears well-developed and well-nourished. No distress.  HENT:  Head: Normocephalic and atraumatic.  Eyes: Conjunctivae are normal.  Neck: Neck supple.  Cardiovascular: Normal rate and regular rhythm.   No murmur heard. Pulmonary/Chest: Effort normal and breath sounds normal. No respiratory distress.  Abdominal: Soft. There is no tenderness.  Musculoskeletal: She exhibits no edema.  Neurological: She is alert.  Skin: Skin is warm and dry.  Innumerable abrasions to lower back/right hip and thighs. Multiple very tiny pieces of glass embedded superficially in the skin. There is a 2 cm laceration to her right lower back/right hip. Minimal bleeding. She additionally has a  laceration to the dorsal aspect of her left foot which is mildly bleeding but controlled with pressure.  Psychiatric: She has a normal mood and affect.  Nursing note and vitals reviewed.    ED Treatments / Results  Labs (all labs ordered are listed, but only abnormal results are displayed) Labs Reviewed - No data to display  EKG  EKG Interpretation None       Radiology No results found.   Dg Foot Complete Left  Result Date: 07/15/2016 CLINICAL DATA:  Fall through shower door today. Laceration along the lateral aspect of the foot. Question foreign body. EXAM: LEFT FOOT - COMPLETE 3+ VIEW COMPARISON:  None available. FINDINGS: Soft tissue swelling is evident over the fifth MTP joint. There is no underlying fracture or radiopaque foreign body. The joints are located. IMPRESSION: 1. Soft tissue swelling without radiopaque foreign body. Electronically Signed   By: Marin Roberts M.D.   On: 07/15/2016 15:50  Procedures Procedures (including critical care time)  LACERATION REPAIR Performed by: Raeford Razor Authorized by: Raeford Razor Consent: Verbal consent obtained. Risks and benefits: risks, benefits and alternatives were discussed Consent given by: patient Patient identity confirmed: provided demographic data Prepped and Draped in normal sterile fashion Wound explored  Laceration Location: R lower back  Laceration Length: 2 cm  No Foreign Bodies seen or palpated  Anesthesia: local infiltration  Local anesthetic: lidocaine   Anesthetic total: 2 ml  Irrigation method: syringe Amount of cleaning: standard  Skin closure: 4-0 prolene  Number of sutures: 4  Technique: simple interupted  Patient tolerance: Patient tolerated the procedure well with no immediate complications.  LACERATION REPAIR Performed by: Raeford Razor Authorized by: Raeford Razor Consent: Verbal consent obtained. Risks and benefits: risks, benefits and alternatives were  discussed Consent given by: patient Patient identity confirmed: provided demographic data Prepped and Draped in normal sterile fashion Wound explored  Laceration Location: L foot  Laceration Length: 1.5 cm  No Foreign Bodies seen or palpated  Anesthesia: local infiltration  Local anesthetic: lidocaine  Anesthetic total: 1 ml  Irrigation method: syringe Amount of cleaning: standard  Skin closure: 4-0 prolene  Number of sutures: 3  Technique: simple interupted  Patient tolerance: Patient tolerated the procedure well with no immediate complications.    Medications Ordered in ED Medications  Tdap (BOOSTRIX) injection 0.5 mL (0.5 mLs Intramuscular Given 07/15/16 1525)  lidocaine (PF) (XYLOCAINE) 1 % injection 10 mL (10 mLs Infiltration Given by Other 07/15/16 1525)  ibuprofen (ADVIL,MOTRIN) tablet 600 mg (600 mg Oral Given 07/15/16 1525)  ondansetron (ZOFRAN-ODT) disintegrating tablet 4 mg (4 mg Oral Given 07/15/16 1525)     Initial Impression / Assessment and Plan / ED Course  I have reviewed the triage vital signs and the nursing notes.  Pertinent labs & imaging results that were available during my care of the patient were reviewed by  me and considered in my medical decision making (see chart for details).  Clinical Course    30 year old female with multiple lacerations.Two required repair. Multiple glass foreign bodies removed. Many very small shards of glass. I would be surprised if some are still embedded. I do not think she has any foreign bodies the lacerations which are closed but she was warned of this possibility. Advised to go home and take a shower in other bath and to be extremely careful when cleaning herself. Wound care return precautions discussed.  Final Clinical Impressions(s) / ED Diagnoses   Final diagnoses:  Multiple lacerations    New Prescriptions New Prescriptions   No medications on file     Raeford Razor, MD 07/23/16 1252

## 2016-09-08 IMAGING — CT CT ABD-PELV W/O CM
1 of 2 series · 14 of 32 positions shown, 18 images · non-contrast
Comparison: Renal ultrasound June 21, 2015 ; CT abdomen and pelvis
December 01, 2014 ; abdominal MRI January 31, 2015

CLINICAL DATA: Right flank pain with vomiting. Right-sided
hydronephrosis.

EXAM:
CT ABDOMEN AND PELVIS WITHOUT CONTRAST
TECHNIQUE: Multidetector CT imaging of the abdomen and pelvis was performed
following the standard protocol without oral or intravenous contrast
material. Patient known to have third trimester intrauterine
gestation ; Dr. Loida Hackman directly with the patient who signed
consent for this study.

[Series 2: routine abdomen/pelvis with · axial · 0.72mm/px · z∈[+490,+890]mm · 14 of 92 slices shown, 18 images]
[im 8/92  soft-tissue]
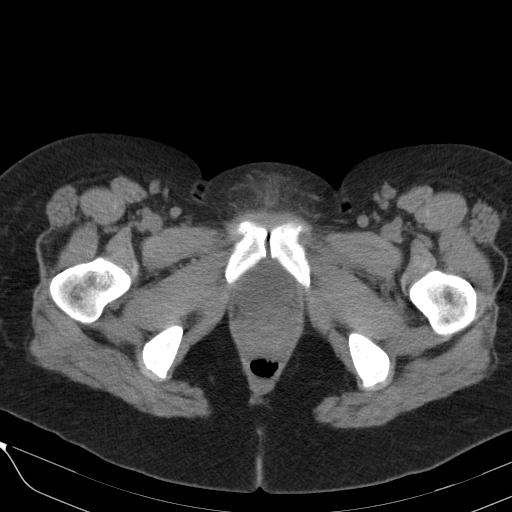
[im 8/92  bone]
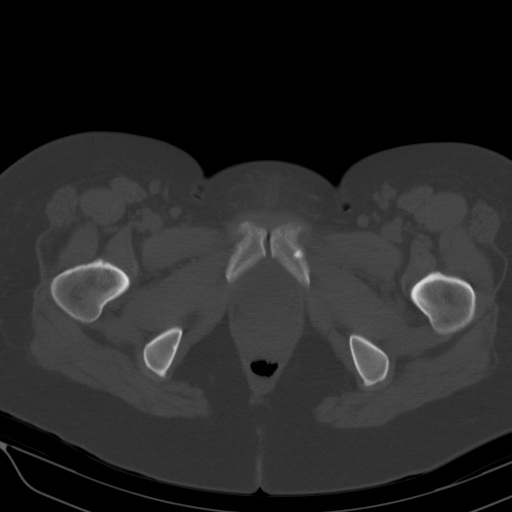
[im 15/92  soft-tissue]
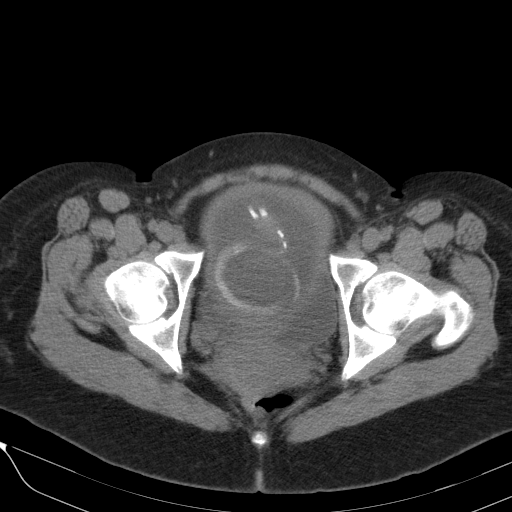
[im 22/92  soft-tissue]
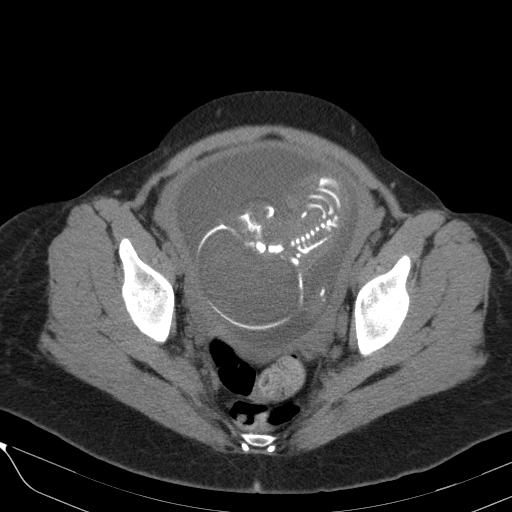
[im 30/92  soft-tissue]
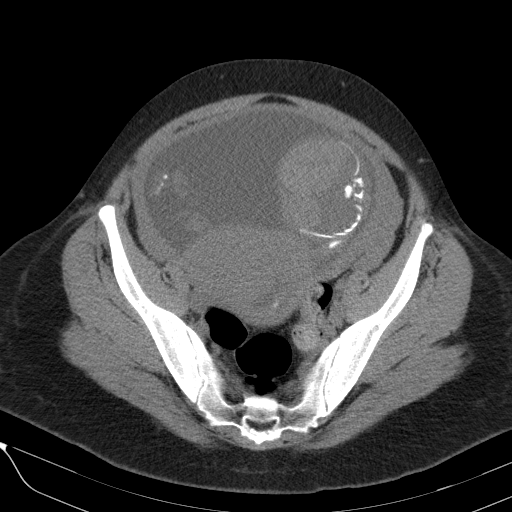
[im 37/92  soft-tissue]
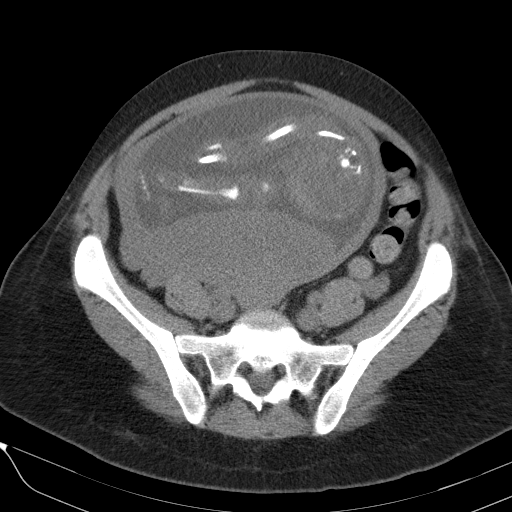
[im 44/92  soft-tissue]
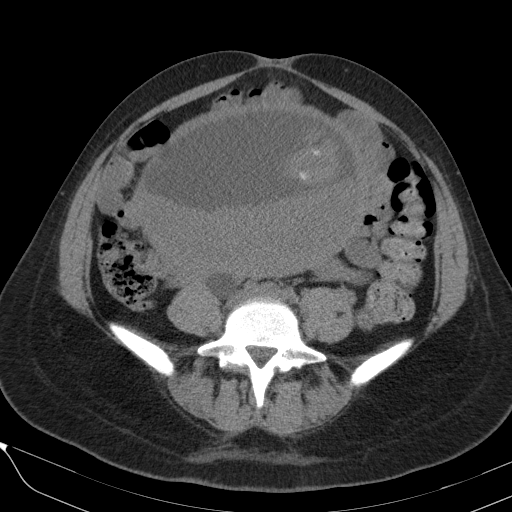
[im 51/92  soft-tissue]
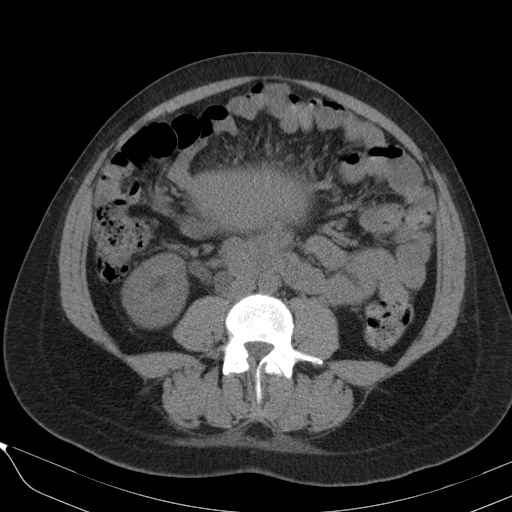
[im 59/92  soft-tissue]
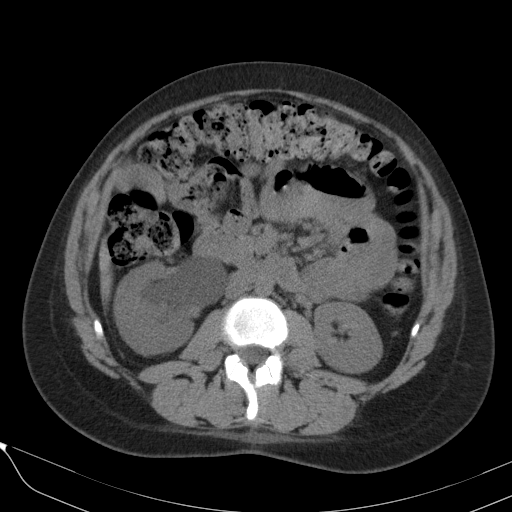
[im 66/92  soft-tissue]
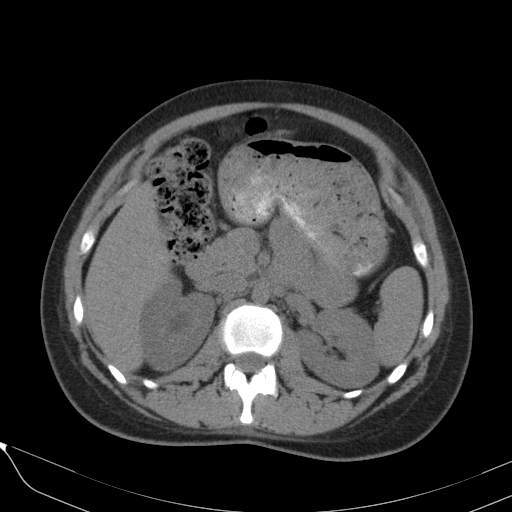
[im 66/92  bone]
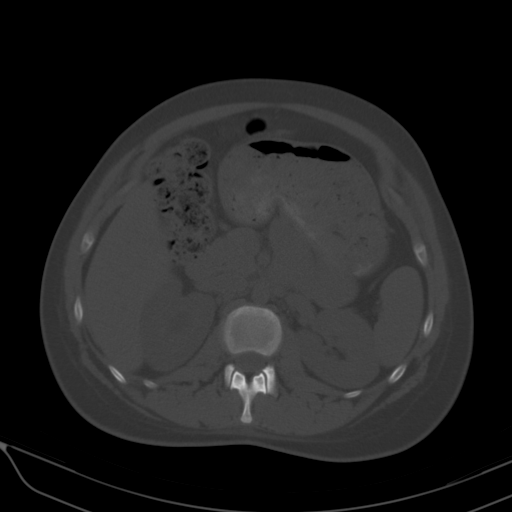
[im 73/92  soft-tissue]
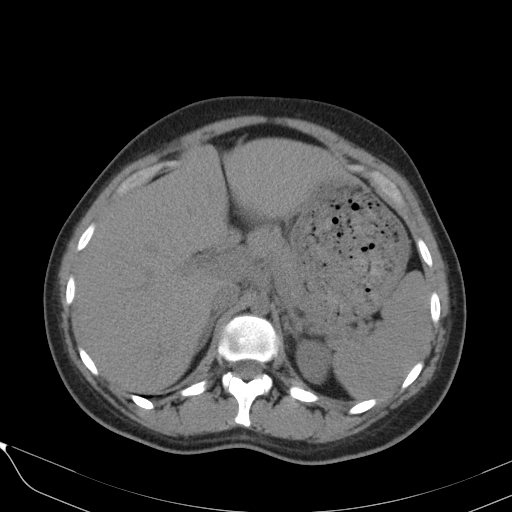
[im 77/92  lung]
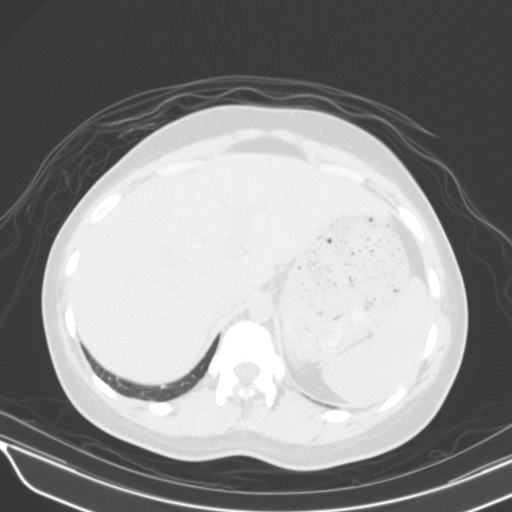
[im 81/92  soft-tissue]
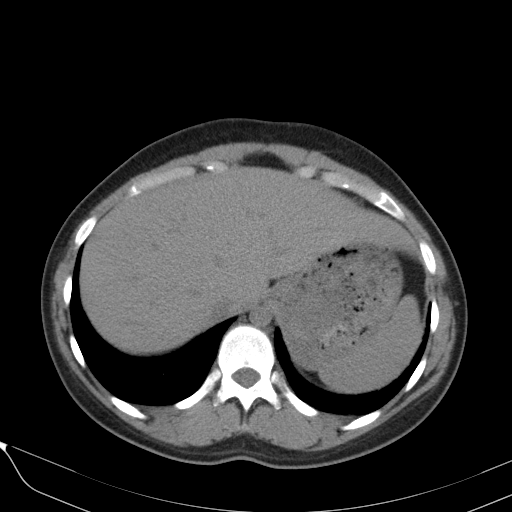
[im 81/92  lung]
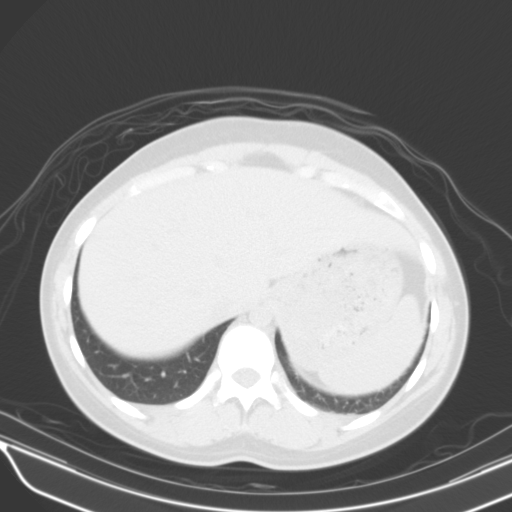
[im 84/92  lung]
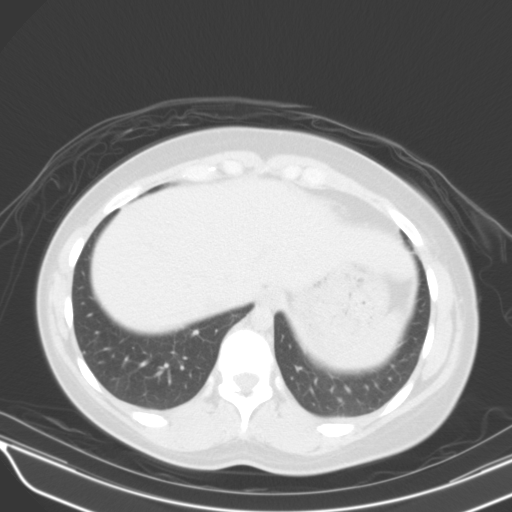
[im 88/92  soft-tissue]
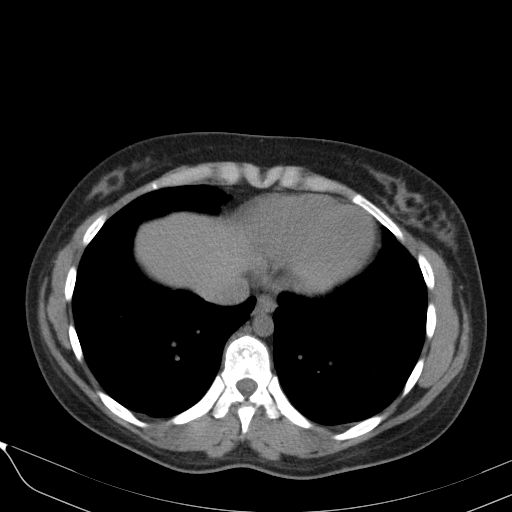
[im 88/92  lung]
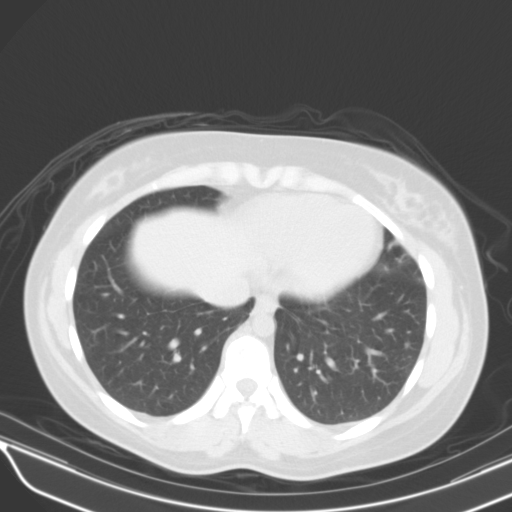

[14 of 32 positions shown; findings below may reference images not displayed]

FINDINGS: There is slight atelectasis in the anterior left base. Lung bases
are otherwise clear.

No focal liver lesions are identified on this noncontrast enhanced
study. Gallbladder is absent. There is no biliary duct dilatation.

Spleen, pancreas, and adrenals appear normal. Left kidney shows no
mass or hydronephrosis. There is no left-sided renal or ureteral
calculus. The right kidney shows no evidence of mass. There is
severe hydronephrosis on the right. There is diffuse dilatation of
the right ureter to the level of the enlarged gravid uterus. The
ureter narrows distally. There is no renal or ureteral calculus
apparent on the right.

Within the pelvis, there is a single gestation in vertex position
with the fetal spine toward the maternal left. The placenta is
posterior in location without demonstrable previa. No placental
abruption. The urinary bladder is midline with normal wall thickness
peer there is no pelvic mass or pelvic fluid collection. The
appendix appears normal.

No bowel obstruction. No free air or portal venous air. There is no
ascites, adenopathy, or abscess in the abdomen or pelvis. There is
no abdominal aortic aneurysm. There are no blastic or lytic bone
lesions.
IMPRESSION: No renal or ureteral calculi on either side. Severe hydronephrosis
and ureterectasis on the right. Ureterectasis extends to the level
of the gravid uterus for the uterus compresses upon the ureter. The
distal most aspect of the right ureter does not appear distended. No
evidence of perinephric stranding or evidence of renal
inflammation/abscess.

Intrauterine gestation as discussed above. Placenta is posterior in
location.

Gallbladder absent.

No bowel obstruction. No abscess. Appendix appears normal. Left
kidney appears normal.

## 2016-09-23 IMAGING — US US RENAL
1 series · 13 of 25 positions shown · non-contrast
Comparison: Urinary tract ultrasound 06/21/2015, 06/01/2015,
04/17/2015. Unenhanced CT abdomen and pelvis 06/23/2015. MRI pelvis
01/31/2015.

CLINICAL DATA: 28-year-old G3 P1 SAB 1, currently approximately 28
weeks pregnant, with intermittent right lower quadrant abdominal
pain and right flank pain over the past month, worsened in the past
day or so. Known hydronephrosis. Prior history of urinary tract
calculi.

EXAM:
RENAL / URINARY TRACT ULTRASOUND COMPLETE

[Series 1: us renal · 45 acquisitions, 13 frames shown]
[im 1/45]
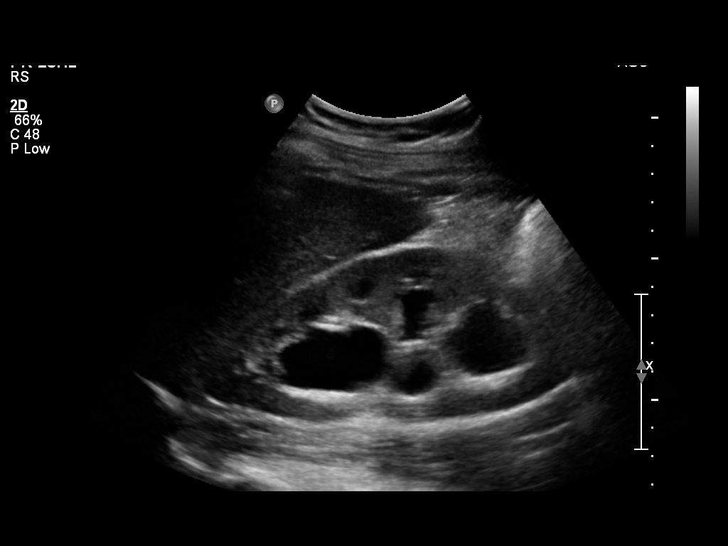
[im 4/45]
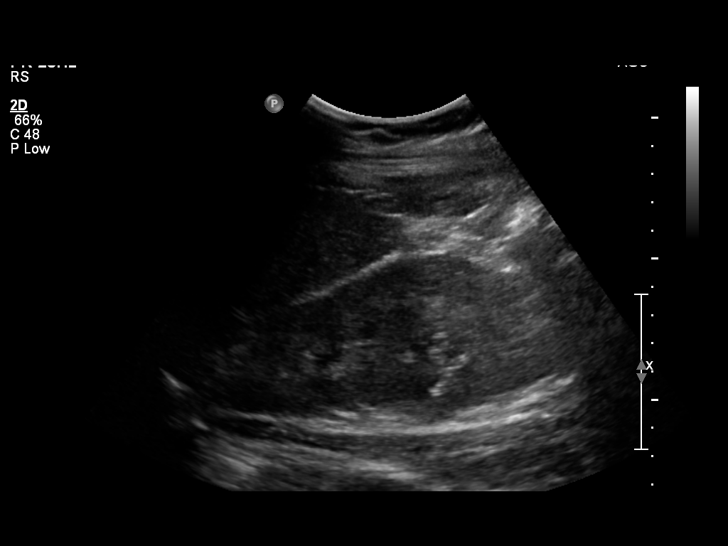
[im 8/45]
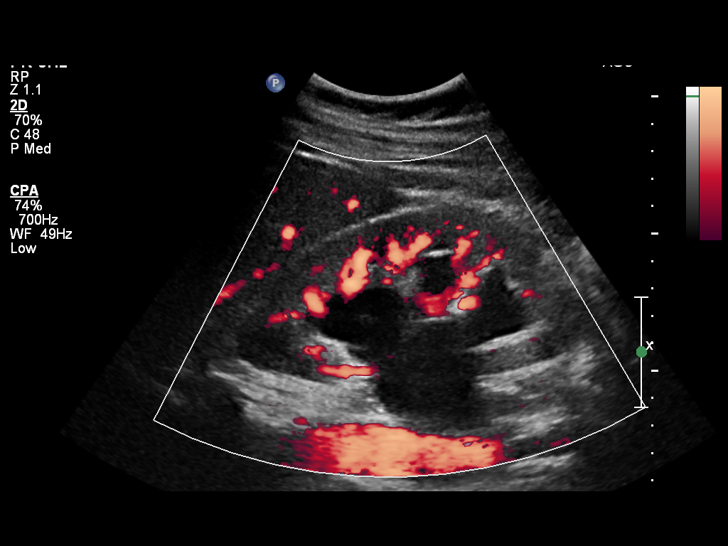
[im 12/45]
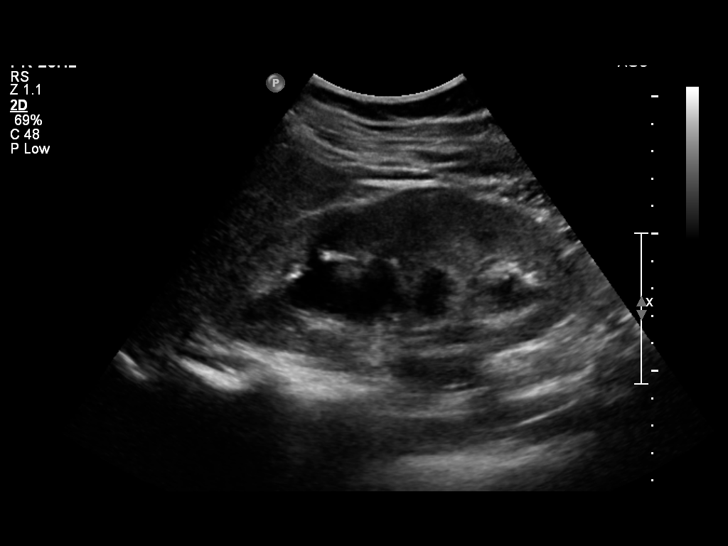
[im 15/45]
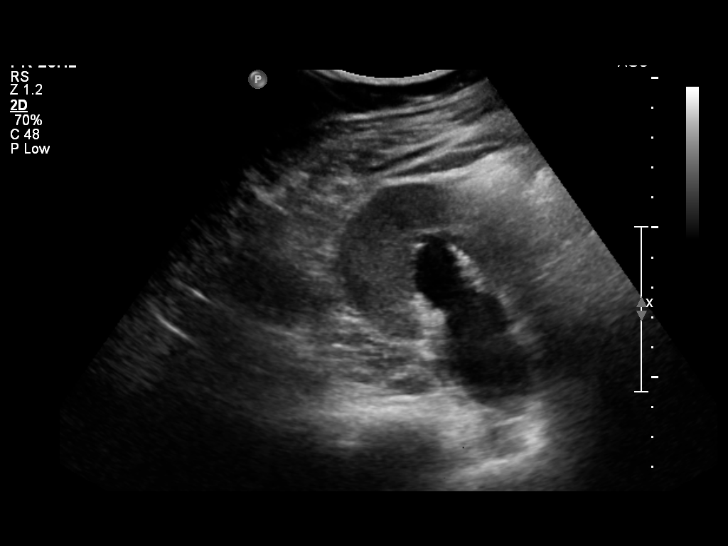
[im 19/45]
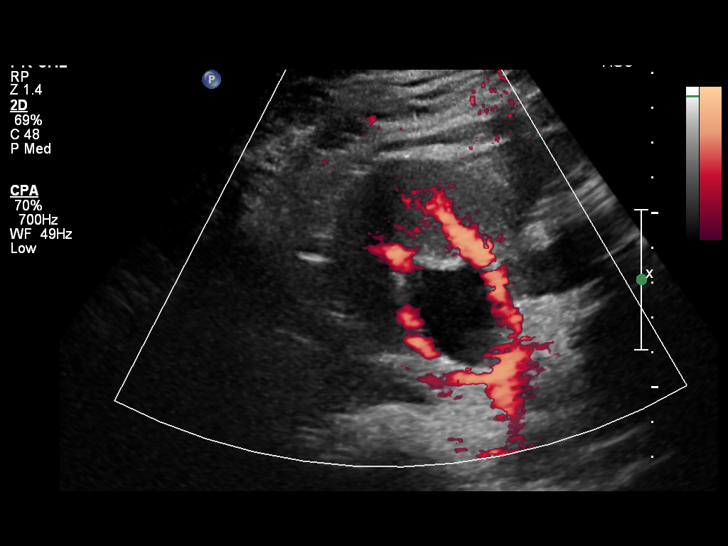
[im 23/45]
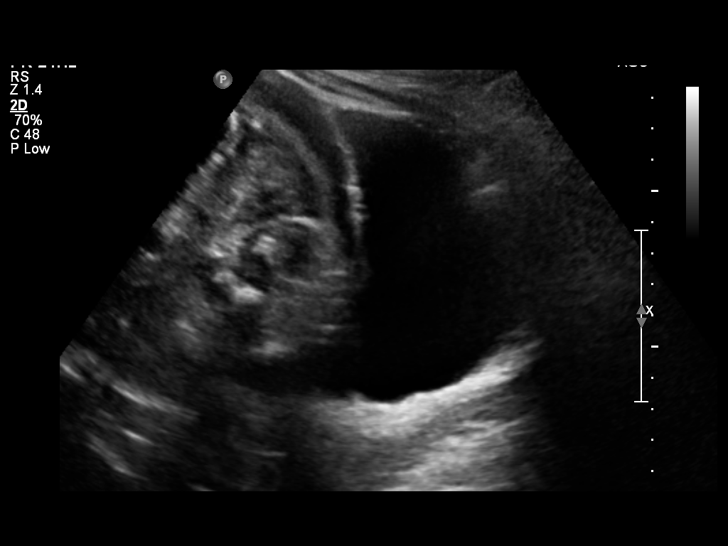
[im 26/45]
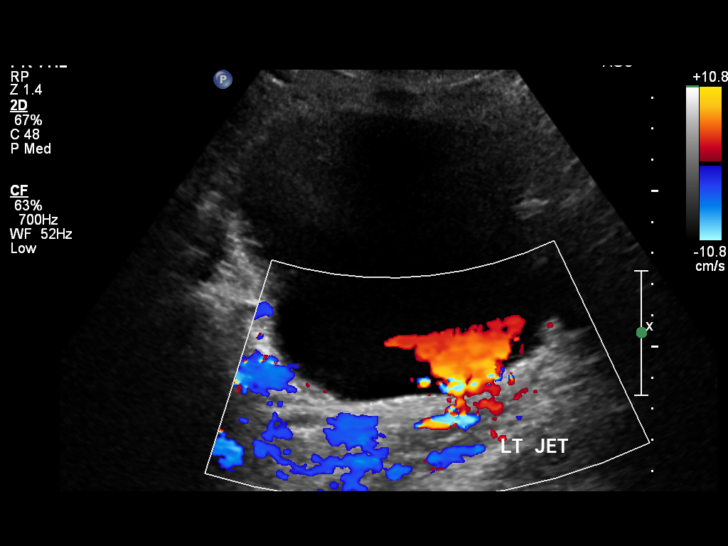
[im 30/45]
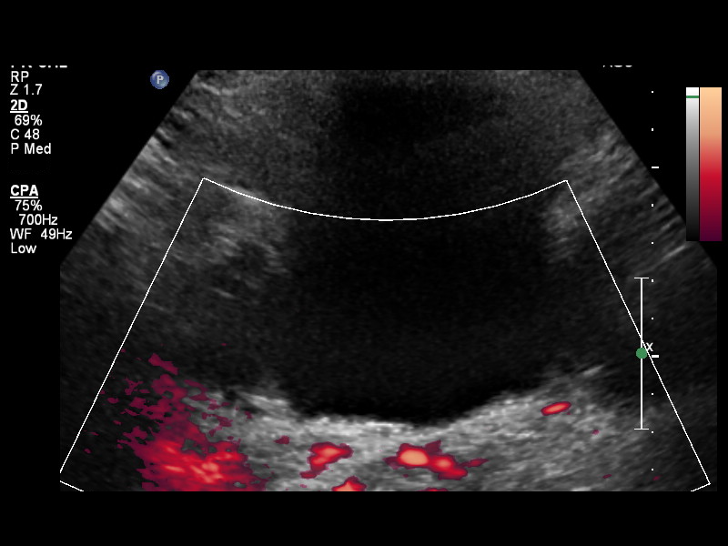
[im 34/45]
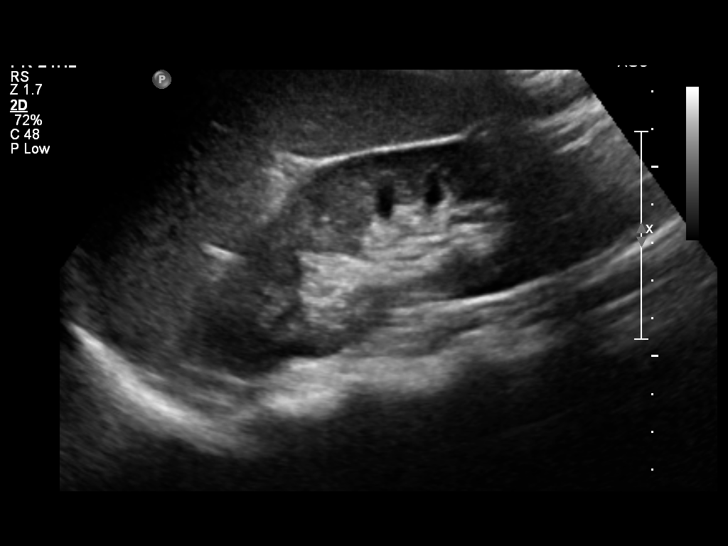
[im 37/45]
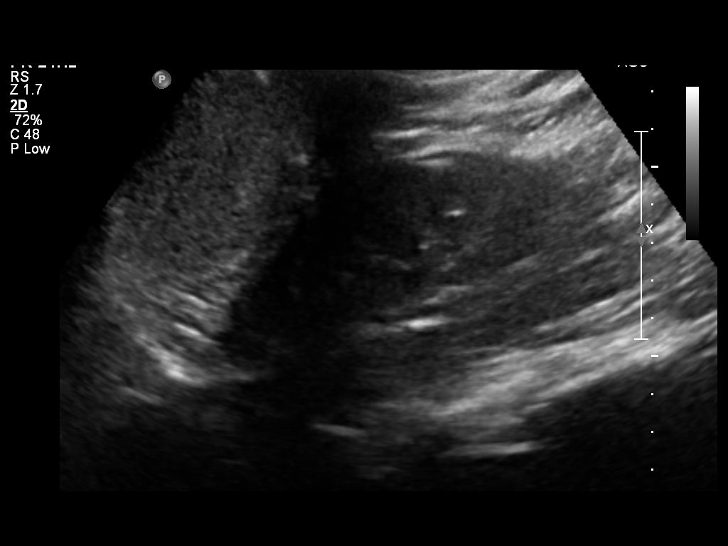
[im 41/45]
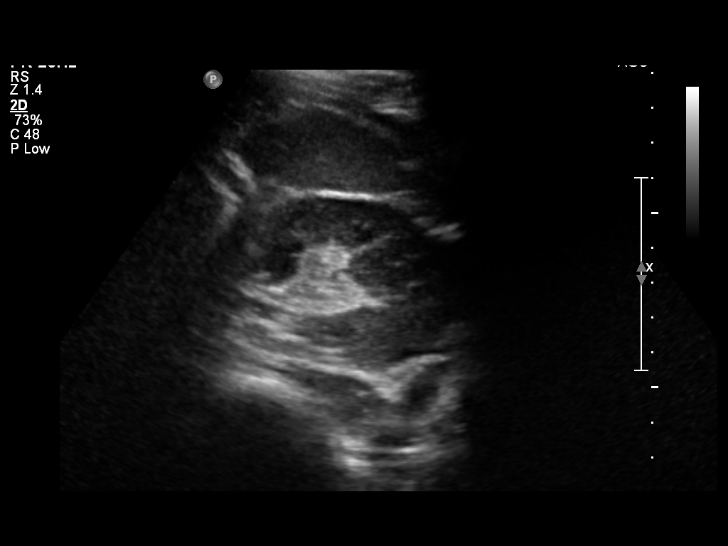
[im 45/45]
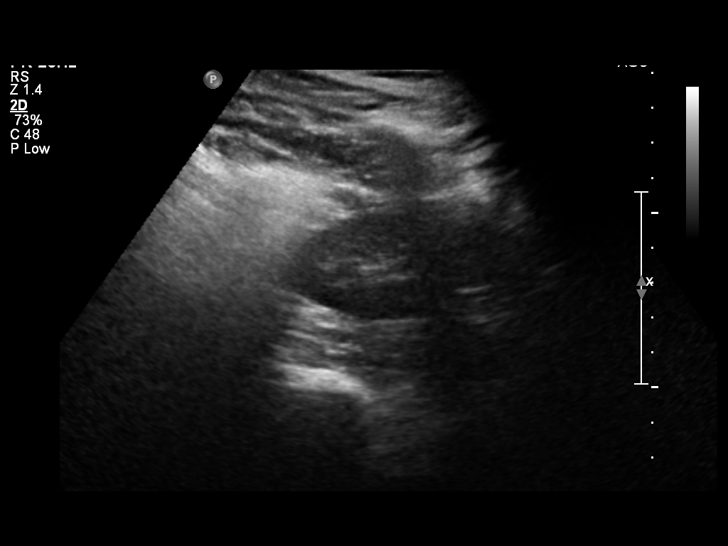

[13 of 25 positions shown; findings below may reference images not displayed]

FINDINGS: Right Kidney:

Length: Approximately 14.5 cm. Marked hydronephrosis, unchanged
since the prior ultrasounds 06/21/2015 and 06/01/2015. No visible
shadowing calculi. No focal parenchymal abnormality. Well-preserved
cortex. Normal parenchymal echotexture.

Left Kidney:

Length: Approximately 12.4 cm. No hydronephrosis. Well-preserved
cortex. No shadowing calculi. Normal parenchymal echotexture. No
focal parenchymal abnormality.

Bladder:

Normal in appearance. Left ureteral jet visualized at color Doppler
evaluation. No right ureteral jet visualized.
IMPRESSION: 1. Stable marked hydronephrosis involving the right kidney dating
back to 06/01/2015.
2. No visible urinary tract calculi.
3. Normal-appearing left kidney.
4. Absent right ureteral jet in the bladder is consistent with
obstruction.

## 2017-06-24 ENCOUNTER — Other Ambulatory Visit: Payer: Self-pay | Admitting: Obstetrics and Gynecology

## 2017-06-27 NOTE — Patient Instructions (Addendum)
Your procedure is scheduled on:  Thursday, 7/26  Enter through the Main Entrance of [facility] at:  10 am  Pick up the phone at the desk and dial [phone].  Call this number if you have problems the morning of surgery: [phone].  Remember: Do NOT eat food or drink clear liquids (including water) after:  Midnight Wednesday  Take these medicines the morning of surgery with a SIP OF WATER:  None  Stop herbal medications and supplements at this time.  Do NOT wear jewelry (body piercing), metal hair clips/bobby pins, make-up, or nail polish. Do NOT wear lotions, powders, or perfumes.  You may wear deoderant. Do NOT shave for 48 hours prior to surgery. Do NOT bring valuables to the hospital.   Leave suitcase in car.  After surgery it may be brought to your room.  For patients admitted to the hospital, checkout time is 11:00 AM the day of discharge. Have a responsible adult drive you home and stay with you for 24 hours after your procedure.  Home with husband Reuel BoomDaniel cell [phone]

## 2017-06-28 ENCOUNTER — Encounter (HOSPITAL_COMMUNITY): Payer: Self-pay

## 2017-06-28 ENCOUNTER — Encounter (HOSPITAL_COMMUNITY)
Admission: RE | Admit: 2017-06-28 | Discharge: 2017-06-28 | Disposition: A | Discharge status: Home / Self Care | Payer: BLUE CROSS/BLUE SHIELD | Source: Ambulatory Visit | Attending: Obstetrics and Gynecology | Admitting: Obstetrics and Gynecology

## 2017-06-28 DIAGNOSIS — Z01812 Encounter for preprocedural laboratory examination: Secondary | ICD-10-CM | POA: Insufficient documentation

## 2017-06-28 HISTORY — DX: Personal history of urinary calculi: Z87.442

## 2017-06-28 LAB — CBC
HCT: 42.2 % (ref 36.0–46.0)
Hemoglobin: 14.2 g/dL (ref 12.0–15.0)
MCH: 31.3 pg (ref 26.0–34.0)
MCHC: 33.6 g/dL (ref 30.0–36.0)
MCV: 93 fL (ref 78.0–100.0)
PLATELETS: 238 10*3/uL (ref 150–400)
RBC: 4.54 MIL/uL (ref 3.87–5.11)
RDW: 12.8 % (ref 11.5–15.5)
WBC: 10.7 10*3/uL — AB (ref 4.0–10.5)

## 2017-07-11 ENCOUNTER — Encounter (HOSPITAL_COMMUNITY)
Admission: RE | Disposition: A | Discharge status: Home / Self Care | Payer: Self-pay | Source: Ambulatory Visit | Attending: Obstetrics and Gynecology

## 2017-07-11 ENCOUNTER — Encounter (HOSPITAL_COMMUNITY): Payer: Self-pay | Admitting: Anesthesiology

## 2017-07-11 ENCOUNTER — Encounter (HOSPITAL_COMMUNITY): Payer: Self-pay

## 2017-07-11 ENCOUNTER — Ambulatory Visit (HOSPITAL_COMMUNITY)
Admission: RE | Admit: 2017-07-11 | Discharge: 2017-07-11 | Disposition: A | Discharge status: Home / Self Care | Payer: BLUE CROSS/BLUE SHIELD | Source: Ambulatory Visit | Attending: Obstetrics and Gynecology | Admitting: Obstetrics and Gynecology

## 2017-07-11 DIAGNOSIS — Z539 Procedure and treatment not carried out, unspecified reason: Secondary | ICD-10-CM | POA: Insufficient documentation

## 2017-07-11 DIAGNOSIS — N8189 Other female genital prolapse: Secondary | ICD-10-CM | POA: Insufficient documentation

## 2017-07-11 SURGERY — HYSTERECTOMY, VAGINAL
Anesthesia: Choice

## 2017-07-11 MED ORDER — LACTATED RINGERS IV SOLN: INTRAVENOUS | Status: DC

## 2017-07-11 MED ORDER — SCOPOLAMINE 1 MG/3DAYS TD PT72: 1.0000 | MEDICATED_PATCH | Freq: Once | TRANSDERMAL | Status: DC

## 2017-07-11 NOTE — Anesthesia Preprocedure Evaluation (Deleted)
Anesthesia Evaluation  Patient identified by MRN, date of birth, ID band Patient awake    Reviewed: Allergy & Precautions, H&P , NPO status , Patient's Chart, lab work & pertinent test results  History of Anesthesia Complications (+) AWARENESS UNDER ANESTHESIA  Airway Mallampati: II  TM Distance: >3 FB Neck ROM: full    Dental no notable dental hx. (+) Dental Advisory Given, Teeth Intact   Pulmonary former smoker,    Pulmonary exam normal breath sounds clear to auscultation       Cardiovascular Exercise Tolerance: Good negative cardio ROS Normal cardiovascular exam Rhythm:regular Rate:Normal     Neuro/Psych PSYCHIATRIC DISORDERS Anxiety Depression negative neurological ROS     GI/Hepatic negative GI ROS, Neg liver ROS,   Endo/Other  negative endocrine ROS  Renal/GU negative Renal ROS     Musculoskeletal   Abdominal   Peds  Hematology negative hematology ROS (+)   Anesthesia Other Findings   Reproductive/Obstetrics                             Anesthesia Physical  Anesthesia Plan  ASA: I  Anesthesia Plan: General   Post-op Pain Management:    Induction: Intravenous  PONV Risk Score and Plan: 3 and Ondansetron, Dexamethasone, Propofol and Midazolam  Airway Management Planned: Oral ETT  Additional Equipment:   Intra-op Plan:   Post-operative Plan: Extubation in OR  Informed Consent: I have reviewed the patients History and Physical, chart, labs and discussed the procedure including the risks, benefits and alternatives for the proposed anesthesia with the patient or authorized representative who has indicated his/her understanding and acceptance.   Dental Advisory Given  Plan Discussed with: CRNA  Anesthesia Plan Comments:         Anesthesia Quick Evaluation

## 2017-07-17 ENCOUNTER — Other Ambulatory Visit: Payer: Self-pay | Admitting: Obstetrics and Gynecology

## 2017-07-18 ENCOUNTER — Encounter (HOSPITAL_COMMUNITY)
Admission: RE | Disposition: A | Discharge status: Home / Self Care | Payer: Self-pay | Source: Ambulatory Visit | Attending: Obstetrics and Gynecology

## 2017-07-18 ENCOUNTER — Ambulatory Visit (HOSPITAL_COMMUNITY): Payer: BLUE CROSS/BLUE SHIELD | Admitting: Anesthesiology

## 2017-07-18 ENCOUNTER — Encounter (HOSPITAL_COMMUNITY): Payer: Self-pay

## 2017-07-18 ENCOUNTER — Observation Stay (HOSPITAL_COMMUNITY)
Admission: RE | Admit: 2017-07-18 | Discharge: 2017-07-19 | Disposition: A | Discharge status: Home / Self Care | Payer: BLUE CROSS/BLUE SHIELD | Source: Ambulatory Visit | Attending: Obstetrics and Gynecology | Admitting: Obstetrics and Gynecology

## 2017-07-18 DIAGNOSIS — Z87891 Personal history of nicotine dependence: Secondary | ICD-10-CM | POA: Diagnosis not present

## 2017-07-18 DIAGNOSIS — N858 Other specified noninflammatory disorders of uterus: Secondary | ICD-10-CM | POA: Diagnosis not present

## 2017-07-18 DIAGNOSIS — N814 Uterovaginal prolapse, unspecified: Principal | ICD-10-CM | POA: Diagnosis present

## 2017-07-18 DIAGNOSIS — M1990 Unspecified osteoarthritis, unspecified site: Secondary | ICD-10-CM | POA: Insufficient documentation

## 2017-07-18 DIAGNOSIS — F329 Major depressive disorder, single episode, unspecified: Secondary | ICD-10-CM | POA: Insufficient documentation

## 2017-07-18 DIAGNOSIS — F419 Anxiety disorder, unspecified: Secondary | ICD-10-CM | POA: Insufficient documentation

## 2017-07-18 DIAGNOSIS — Z79899 Other long term (current) drug therapy: Secondary | ICD-10-CM | POA: Insufficient documentation

## 2017-07-18 HISTORY — PX: VAGINAL HYSTERECTOMY: SHX2639

## 2017-07-18 LAB — PREGNANCY, URINE: Preg Test, Ur: NEGATIVE

## 2017-07-18 SURGERY — HYSTERECTOMY, VAGINAL
Anesthesia: General | Site: Vagina

## 2017-07-18 MED ORDER — HYDROMORPHONE HCL 1 MG/ML IJ SOLN
INTRAMUSCULAR | Status: AC
  Filled 2017-07-18: qty 1

## 2017-07-18 MED ORDER — GLYCOPYRROLATE 0.2 MG/ML IJ SOLN
INTRAMUSCULAR | Status: AC
Start: 2017-07-18 — End: ?
  Filled 2017-07-18: qty 1

## 2017-07-18 MED ORDER — SIMETHICONE 80 MG PO CHEW: 80.0000 mg | CHEWABLE_TABLET | Freq: Four times a day (QID) | ORAL | Status: DC | PRN

## 2017-07-18 MED ORDER — PROPOFOL 10 MG/ML IV BOLUS
INTRAVENOUS | Status: DC | PRN
  Administered 2017-07-18: 180 mg via INTRAVENOUS

## 2017-07-18 MED ORDER — FENTANYL CITRATE (PF) 100 MCG/2ML IJ SOLN
INTRAMUSCULAR | Status: AC
  Filled 2017-07-18: qty 2

## 2017-07-18 MED ORDER — ONDANSETRON HCL 4 MG/2ML IJ SOLN
INTRAMUSCULAR | Status: AC
Start: 2017-07-18 — End: ?
  Filled 2017-07-18: qty 2

## 2017-07-18 MED ORDER — KETOROLAC TROMETHAMINE 30 MG/ML IJ SOLN
30.0000 mg | Freq: Once | INTRAMUSCULAR | Status: AC
  Administered 2017-07-18: 30 mg via INTRAVENOUS
  Filled 2017-07-18: qty 1

## 2017-07-18 MED ORDER — LIDOCAINE HCL (CARDIAC) 20 MG/ML IV SOLN
INTRAVENOUS | Status: AC
  Filled 2017-07-18: qty 5

## 2017-07-18 MED ORDER — FENTANYL CITRATE (PF) 250 MCG/5ML IJ SOLN
INTRAMUSCULAR | Status: AC
  Filled 2017-07-18: qty 5

## 2017-07-18 MED ORDER — MEPERIDINE HCL 25 MG/ML IJ SOLN
6.2500 mg | INTRAMUSCULAR | Status: DC | PRN
  Administered 2017-07-18 (×2): 6.25 mg via INTRAVENOUS

## 2017-07-18 MED ORDER — DEXAMETHASONE SODIUM PHOSPHATE 10 MG/ML IJ SOLN
INTRAMUSCULAR | Status: AC
  Filled 2017-07-18: qty 1

## 2017-07-18 MED ORDER — ROCURONIUM BROMIDE 100 MG/10ML IV SOLN
INTRAVENOUS | Status: AC
  Filled 2017-07-18: qty 1

## 2017-07-18 MED ORDER — SODIUM CHLORIDE 0.9% FLUSH: 9.0000 mL | INTRAVENOUS | Status: DC | PRN

## 2017-07-18 MED ORDER — FENTANYL CITRATE (PF) 100 MCG/2ML IJ SOLN
25.0000 ug | INTRAMUSCULAR | Status: DC | PRN
  Administered 2017-07-18 (×3): 50 ug via INTRAVENOUS

## 2017-07-18 MED ORDER — DEXAMETHASONE SODIUM PHOSPHATE 4 MG/ML IJ SOLN
INTRAMUSCULAR | Status: DC | PRN
  Administered 2017-07-18: 10 mg via INTRAVENOUS

## 2017-07-18 MED ORDER — PROPOFOL 10 MG/ML IV BOLUS
INTRAVENOUS | Status: AC
  Filled 2017-07-18: qty 20

## 2017-07-18 MED ORDER — LACTATED RINGERS IV SOLN
INTRAVENOUS | Status: DC
  Administered 2017-07-18: 125 mL/h via INTRAVENOUS
  Administered 2017-07-18: 08:00:00 via INTRAVENOUS

## 2017-07-18 MED ORDER — DIPHENHYDRAMINE HCL 50 MG/ML IJ SOLN: 12.5000 mg | Freq: Four times a day (QID) | INTRAMUSCULAR | Status: DC | PRN

## 2017-07-18 MED ORDER — MIDAZOLAM HCL 2 MG/2ML IJ SOLN
INTRAMUSCULAR | Status: AC
  Filled 2017-07-18: qty 2

## 2017-07-18 MED ORDER — SCOPOLAMINE 1 MG/3DAYS TD PT72
MEDICATED_PATCH | TRANSDERMAL | Status: AC
  Administered 2017-07-18: 1.5 mg via TRANSDERMAL
  Filled 2017-07-18: qty 1

## 2017-07-18 MED ORDER — ONDANSETRON HCL 4 MG PO TABS: 4.0000 mg | ORAL_TABLET | Freq: Four times a day (QID) | ORAL | Status: DC | PRN

## 2017-07-18 MED ORDER — OXYCODONE-ACETAMINOPHEN 5-325 MG PO TABS
1.0000 | ORAL_TABLET | ORAL | Status: DC | PRN
  Administered 2017-07-18 (×3): 2 via ORAL
  Administered 2017-07-19: 1 via ORAL
  Filled 2017-07-18 (×2): qty 2
  Filled 2017-07-18: qty 1
  Filled 2017-07-18: qty 2

## 2017-07-18 MED ORDER — DEXTROSE IN LACTATED RINGERS 5 % IV SOLN
INTRAVENOUS | Status: DC
  Administered 2017-07-18 – 2017-07-19 (×2): via INTRAVENOUS

## 2017-07-18 MED ORDER — SCOPOLAMINE 1 MG/3DAYS TD PT72
1.0000 | MEDICATED_PATCH | Freq: Once | TRANSDERMAL | Status: DC
  Administered 2017-07-18: 1.5 mg via TRANSDERMAL

## 2017-07-18 MED ORDER — CEFAZOLIN SODIUM-DEXTROSE 2-4 GM/100ML-% IV SOLN
INTRAVENOUS | Status: AC
  Filled 2017-07-18: qty 100

## 2017-07-18 MED ORDER — MEPERIDINE HCL 25 MG/ML IJ SOLN
INTRAMUSCULAR | Status: AC
  Filled 2017-07-18: qty 1

## 2017-07-18 MED ORDER — MIDAZOLAM HCL 5 MG/5ML IJ SOLN
INTRAMUSCULAR | Status: DC | PRN
  Administered 2017-07-18: 2 mg via INTRAVENOUS

## 2017-07-18 MED ORDER — HYDROMORPHONE HCL 2 MG/ML IJ SOLN
2.0000 mg | Freq: Once | INTRAMUSCULAR | Status: AC
  Administered 2017-07-18: 2 mg via INTRAVENOUS
  Filled 2017-07-18: qty 1

## 2017-07-18 MED ORDER — KETOROLAC TROMETHAMINE 30 MG/ML IJ SOLN
30.0000 mg | Freq: Four times a day (QID) | INTRAMUSCULAR | Status: DC
  Administered 2017-07-18 – 2017-07-19 (×2): 30 mg via INTRAVENOUS
  Filled 2017-07-18 (×2): qty 1

## 2017-07-18 MED ORDER — ROCURONIUM BROMIDE 100 MG/10ML IV SOLN
INTRAVENOUS | Status: DC | PRN
  Administered 2017-07-18: 5 mg via INTRAVENOUS
  Administered 2017-07-18: 35 mg via INTRAVENOUS

## 2017-07-18 MED ORDER — SUGAMMADEX SODIUM 200 MG/2ML IV SOLN
INTRAVENOUS | Status: DC | PRN
  Administered 2017-07-18: 180 mg via INTRAVENOUS

## 2017-07-18 MED ORDER — LIDOCAINE-EPINEPHRINE 1 %-1:100000 IJ SOLN
INTRAMUSCULAR | Status: DC | PRN
  Administered 2017-07-18: 10 mL

## 2017-07-18 MED ORDER — METOCLOPRAMIDE HCL 5 MG/ML IJ SOLN: 10.0000 mg | Freq: Once | INTRAMUSCULAR | Status: DC | PRN

## 2017-07-18 MED ORDER — DIPHENHYDRAMINE HCL 12.5 MG/5ML PO ELIX: 12.5000 mg | ORAL_SOLUTION | Freq: Four times a day (QID) | ORAL | Status: DC | PRN

## 2017-07-18 MED ORDER — SUGAMMADEX SODIUM 200 MG/2ML IV SOLN
INTRAVENOUS | Status: AC
  Filled 2017-07-18: qty 2

## 2017-07-18 MED ORDER — ONDANSETRON HCL 4 MG/2ML IJ SOLN: 4.0000 mg | Freq: Four times a day (QID) | INTRAMUSCULAR | Status: DC | PRN

## 2017-07-18 MED ORDER — ONDANSETRON HCL 4 MG/2ML IJ SOLN
INTRAMUSCULAR | Status: DC | PRN
  Administered 2017-07-18: 4 mg via INTRAVENOUS

## 2017-07-18 MED ORDER — HYDROMORPHONE HCL 2 MG/ML IJ SOLN
2.0000 mg | INTRAMUSCULAR | Status: DC | PRN
  Administered 2017-07-18: 2 mg via INTRAVENOUS
  Filled 2017-07-18: qty 1

## 2017-07-18 MED ORDER — LACTATED RINGERS IV SOLN: INTRAVENOUS | Status: DC

## 2017-07-18 MED ORDER — CEFAZOLIN SODIUM-DEXTROSE 2-4 GM/100ML-% IV SOLN
2.0000 g | INTRAVENOUS | Status: AC
  Administered 2017-07-18: 2 g via INTRAVENOUS

## 2017-07-18 MED ORDER — LIDOCAINE-EPINEPHRINE 1 %-1:100000 IJ SOLN
INTRAMUSCULAR | Status: AC
  Filled 2017-07-18: qty 1

## 2017-07-18 MED ORDER — KETOROLAC TROMETHAMINE 30 MG/ML IJ SOLN
INTRAMUSCULAR | Status: AC
  Filled 2017-07-18: qty 2

## 2017-07-18 MED ORDER — HYDROMORPHONE 1 MG/ML IV SOLN
INTRAVENOUS | Status: DC
  Administered 2017-07-18: 21:00:00 via INTRAVENOUS
  Administered 2017-07-18: 1.1 mg via INTRAVENOUS
  Administered 2017-07-19 (×2): 2 mg via INTRAVENOUS
  Administered 2017-07-19: 2.6 mg via INTRAVENOUS
  Filled 2017-07-18: qty 25

## 2017-07-18 MED ORDER — KETOROLAC TROMETHAMINE 30 MG/ML IJ SOLN
INTRAMUSCULAR | Status: DC | PRN
  Administered 2017-07-18: 30 mg via INTRAMUSCULAR
  Administered 2017-07-18: 30 mg via INTRAVENOUS

## 2017-07-18 MED ORDER — DOCUSATE SODIUM 100 MG PO CAPS
100.0000 mg | ORAL_CAPSULE | Freq: Two times a day (BID) | ORAL | Status: DC
  Administered 2017-07-18 – 2017-07-19 (×2): 100 mg via ORAL
  Filled 2017-07-18 (×2): qty 1

## 2017-07-18 MED ORDER — SODIUM CHLORIDE 0.9 % IV SOLN: 2.0000 mg/h | INTRAVENOUS | Status: DC

## 2017-07-18 MED ORDER — LIDOCAINE 2% (20 MG/ML) 5 ML SYRINGE
INTRAMUSCULAR | Status: DC | PRN
  Administered 2017-07-18: 80 mg via INTRAVENOUS

## 2017-07-18 MED ORDER — NALOXONE HCL 0.4 MG/ML IJ SOLN: 0.4000 mg | INTRAMUSCULAR | Status: DC | PRN

## 2017-07-18 MED ORDER — FENTANYL CITRATE (PF) 100 MCG/2ML IJ SOLN
INTRAMUSCULAR | Status: DC | PRN
  Administered 2017-07-18: 50 ug via INTRAVENOUS
  Administered 2017-07-18: 100 ug via INTRAVENOUS
  Administered 2017-07-18 (×2): 50 ug via INTRAVENOUS

## 2017-07-18 SURGICAL SUPPLY — 26 items
CANISTER SUCT 3000ML PPV (MISCELLANEOUS) ×2 IMPLANT
CLOTH BEACON ORANGE TIMEOUT ST (SAFETY) ×2 IMPLANT
CONT PATH 16OZ SNAP LID 3702 (MISCELLANEOUS) ×2 IMPLANT
CONTAINER PREFILL 10% NBF 60ML (FORM) ×2 IMPLANT
DECANTER SPIKE VIAL GLASS SM (MISCELLANEOUS) ×2 IMPLANT
GLOVE BIOGEL PI IND STRL 7.0 (GLOVE) ×1 IMPLANT
GLOVE BIOGEL PI INDICATOR 7.0 (GLOVE) ×1
GLOVE ECLIPSE 7.0 STRL STRAW (GLOVE) ×4 IMPLANT
GOWN STRL REUS W/TWL LRG LVL3 (GOWN DISPOSABLE) ×10 IMPLANT
NEEDLE HYPO 22GX1.5 SAFETY (NEEDLE) IMPLANT
NEEDLE SPNL 18GX3.5 QUINCKE PK (NEEDLE) ×2 IMPLANT
NEEDLE SPNL 22GX3.5 QUINCKE BK (NEEDLE) ×2 IMPLANT
NS IRRIG 1000ML POUR BTL (IV SOLUTION) ×2 IMPLANT
PACK VAGINAL WOMENS (CUSTOM PROCEDURE TRAY) ×2 IMPLANT
PAD OB MATERNITY 4.3X12.25 (PERSONAL CARE ITEMS) ×2 IMPLANT
SPONGE LAP 4X18 X RAY DECT (DISPOSABLE) ×2 IMPLANT
SUT MNCRL 0 MO-4 VIOLET 18 CR (SUTURE) ×3 IMPLANT
SUT MNCRL 0 VIOLET 6X18 (SUTURE) ×1 IMPLANT
SUT MONOCRYL 0 6X18 (SUTURE) ×1
SUT MONOCRYL 0 MO 4 18  CR/8 (SUTURE) ×3
SUT VIC AB 0 CT1 27 (SUTURE) ×2
SUT VIC AB 0 CT1 27XBRD ANBCTR (SUTURE) ×2 IMPLANT
SUT VIC AB 2-0 CT1 27 (SUTURE) ×3
SUT VIC AB 2-0 CT1 TAPERPNT 27 (SUTURE) ×3 IMPLANT
TOWEL OR 17X24 6PK STRL BLUE (TOWEL DISPOSABLE) ×4 IMPLANT
TRAY FOLEY CATH SILVER 14FR (SET/KITS/TRAYS/PACK) ×2 IMPLANT

## 2017-07-18 NOTE — H&P (Signed)
Brenda Webb is an 31 y.o. [id number] white female who presents for a TVH secondary to symptomatic prolapse. She c/o increased pelvic pressure and occ discomfort with sex for > 31yr She has had an ablation for menorrhagia and a BTL. She has nl GI/Gu function Chief Complaint: HPI:  Past Medical History:  Diagnosis Date  . Anxiety   . Arthritis   . Complication of anesthesia    "wakes up during anesthesia"  . Depression   . History of kidney stones    no surgery required - passed stone  . [place]   . SVD (spontaneous vaginal delivery)    x 2    Past Surgical History:  Procedure Laterality Date  . CHOLECYSTECTOMY  11/12/2011   Procedure: LAPAROSCOPIC CHOLECYSTECTOMY;  Surgeon: EGayland Curry MD;  Location: [facility];  Service: General;  Laterality: N/A;  . HYSTEROSCOPY WITH NOVASURE N/A 03/20/2016   Procedure: Dilatation and Currettage, HYSTEROSCOPY WITH NOVASURE;  Surgeon: MOlga Millers MD;  [facility];  Service: Gynecology;  Laterality: N/A;  . TONSILLECTOMY  2004  . TUBAL LIGATION Bilateral 09/10/2015   Procedure: POST PARTUM TUBAL LIGATION;  Surgeon: MBobbye Charleston MD;  [facility];  Service: Gynecology;  Laterality: Bilateral;  . WISDOM TOOTH EXTRACTION      Family History  Problem Relation Age of Onset  . Cancer Paternal Grandfather        bladder   Social History:  reports that she has quit smoking. Her smoking use included Cigarettes. She has a 1.25 pack-year smoking history. She has never used smokeless tobacco. She reports that she does not drink alcohol or use drugs.  Allergies:  Allergies  Allergen Reactions  . Shrimp [Shellfish Allergy] Swelling    Medications Prior to Admission  Medication Sig Dispense Refill  . cyclobenzaprine (FLEXERIL) 10 MG tablet Take 10 mg by mouth daily as needed for muscle spasms.    .Marland KitchenHYDROcodone Bitartrate ER (HYSINGLA ER) 20 MG T24A Take 20 mg by mouth daily.    .Marland KitchenHYDROcodone-acetaminophen (NORCO) 7.5-325 MG  tablet Take 1 tablet by mouth 2 (two) times daily as needed for moderate pain.    .Marland Kitchennorethindrone-ethinyl estradiol (JUNEL FE,GILDESS FE,LOESTRIN FE) 1-20 MG-MCG tablet Take 1 tablet by mouth daily.    . Prenatal Vit-Fe Fumarate-FA (PRENATAL PO) Take 1 tablet by mouth daily.    .Marland Kitchensenna (SENOKOT) 8.6 MG TABS tablet Take 1 tablet by mouth daily as needed for mild constipation.    . Certolizumab Pegol (CIMZIA) 2 X 200 MG KIT Inject 200 mg into the skin See admin instructions. Inject into stomach twice monthly    . lidocaine (LIDODERM) 5 % Place 1 patch onto the skin daily. Remove & Discard patch within 12 hours or as directed by MD (Patient not taking: Reported on 06/25/2017) 30 patch 0  . methotrexate (RHEUMATREX) 2.5 MG tablet Take 25 mg by mouth every Friday. Caution:Chemotherapy. Protect from light.    . predniSONE (DELTASONE) 20 MG tablet Take 20 mg by mouth daily as needed (joint flare ups).          Blood pressure 122/79, pulse 94, temperature 98.9 F (37.2 C), temperature source Oral, resp. rate 16, SpO2 99 %, unknown if currently breastfeeding. General appearance: alert and cooperative Lungs: clear to auscultation bilaterally Heart: regular rate and rhythm, S1, S2 normal, no murmur, click, rub or gallop Abdomen: soft, non-tender; bowel sounds normal; no masses,  no organomegaly Pelvic: cervix normal in appearance, external genitalia normal, no  adnexal masses or tenderness, no cervical motion tenderness, rectovaginal septum normal, uterus normal size, shape, and consistency and vagina normal without discharge first degree prolapse   Lab Results  Component Value Date   WBC 10.7 (H) 06/28/2017   HGB 14.2 06/28/2017   HCT 42.2 06/28/2017   MCV 93.0 06/28/2017   PLT 238 06/28/2017   Lab Results  Component Value Date   PREGTESTUR NEGATIVE 07/18/2017   PREGSERUM NEGATIVE 11/12/2011       Patient Active Problem List   Diagnosis Date Noted  . Active labor at term 09/09/2015  .  Postpartum state 09/09/2015  . Gestational hypertension, antepartum 08/23/2015  . Pregnancy with nephrolithiasis in second trimester 06/22/2015  . Pyelonephritis affecting pregnancy in second trimester, antepartum 04/21/2015   IMP/ Symptomatic prolapse Plan/ TVH  Lucien Budney E 07/18/2017, 7:07 AM

## 2017-07-18 NOTE — Anesthesia Preprocedure Evaluation (Signed)
Anesthesia Evaluation  Patient identified by MRN, date of birth, ID band Patient awake    Reviewed: Allergy & Precautions, NPO status , Patient's Chart, lab work & pertinent test results  Airway Mallampati: II  TM Distance: >3 FB Neck ROM: Full    Dental no notable dental hx.    Pulmonary neg pulmonary ROS, former smoker,    Pulmonary exam normal breath sounds clear to auscultation       Cardiovascular negative cardio ROS Normal cardiovascular exam Rhythm:Regular Rate:Normal     Neuro/Psych negative neurological ROS  negative psych ROS   GI/Hepatic negative GI ROS, Neg liver ROS,   Endo/Other  negative endocrine ROS  Renal/GU negative Renal ROS  negative genitourinary   Musculoskeletal  (+) Arthritis , Psoriatic arthritis    Abdominal   Peds negative pediatric ROS (+)  Hematology negative hematology ROS (+)   Anesthesia Other Findings   Reproductive/Obstetrics negative OB ROS                             Anesthesia Physical Anesthesia Plan  ASA: II  Anesthesia Plan: General   Post-op Pain Management:    Induction: Intravenous  PONV Risk Score and Plan: 4 or greater and Ondansetron, Dexamethasone, Midazolam and Scopolamine patch - Pre-op  Airway Management Planned: Oral ETT  Additional Equipment:   Intra-op Plan:   Post-operative Plan: Extubation in OR  Informed Consent: I have reviewed the patients History and Physical, chart, labs and discussed the procedure including the risks, benefits and alternatives for the proposed anesthesia with the patient or authorized representative who has indicated his/her understanding and acceptance.   Dental advisory given  Plan Discussed with: CRNA  Anesthesia Plan Comments:         Anesthesia Quick Evaluation

## 2017-07-18 NOTE — Transfer of Care (Signed)
Immediate Anesthesia Transfer of Care Note  Patient: Brenda ReachJamie Webb  Procedure(s) Performed: Procedure(s) with comments: HYSTERECTOMY VAGINAL (N/A) - with BSO  Patient Location: PACU  Anesthesia Type:General  Level of Consciousness: awake, alert  and oriented  Airway & Oxygen Therapy: Patient Spontanous Breathing and Patient connected to nasal cannula oxygen  Post-op Assessment: Report given to RN and Post -op Vital signs reviewed and stable  Post vital signs: Reviewed and stable  Last Vitals:  Vitals:   07/18/17 0614  BP: 122/79  Pulse: 94  Resp: 16  Temp: 37.2 C    Last Pain:  Vitals:   07/18/17 0614  TempSrc: Oral  PainSc: 3       Patients Stated Pain Goal: 4 (07/18/17 [id number])  Complications: No apparent anesthesia complications

## 2017-07-18 NOTE — Anesthesia Postprocedure Evaluation (Signed)
Anesthesia Post Note  Patient: Brenda Webb  Lovina Reachrocedure(s) Performed: Procedure(s) (LRB): HYSTERECTOMY VAGINAL (N/A)     Patient location during evaluation: PACU Anesthesia Type: General Level of consciousness: awake and alert Pain management: pain level controlled Vital Signs Assessment: post-procedure vital signs reviewed and stable Respiratory status: spontaneous breathing, nonlabored ventilation, respiratory function stable and patient connected to nasal cannula oxygen Cardiovascular status: blood pressure returned to baseline and stable Postop Assessment: no signs of nausea or vomiting Anesthetic complications: no    Last Vitals:  Vitals:   07/18/17 1120 07/18/17 1630  BP: (!) 142/80 (!) 144/82  Pulse: 95 100  Resp: (!) 21 20  Temp: 36.9 C 36.8 C    Last Pain:  Vitals:   07/18/17 1720  TempSrc:   PainSc: 3    Pain Goal: Patients Stated Pain Goal: 3 (07/18/17 1512)               Phillips Groutarignan, William Schake

## 2017-07-18 NOTE — Anesthesia Procedure Notes (Signed)
Procedure Name: Intubation Date/Time: 07/18/2017 7:31 AM Performed by: Vernice Jefferson Pre-anesthesia Checklist: Patient identified, Emergency Drugs available, Suction available, Patient being monitored and Timeout performed Patient Re-evaluated:Patient Re-evaluated prior to induction Oxygen Delivery Method: Circle system utilized Preoxygenation: Pre-oxygenation with 100% oxygen Induction Type: IV induction Ventilation: Mask ventilation without difficulty Laryngoscope Size: Mac and 3 Grade View: Grade II Tube type: Oral Tube size: 7.0 mm Number of attempts: 1 Airway Equipment and Method: Stylet Placement Confirmation: ETT inserted through vocal cords under direct vision,  positive ETCO2 and breath sounds checked- equal and bilateral Secured at: 21 cm Tube secured with: Tape Dental Injury: Teeth and Oropharynx as per pre-operative assessment

## 2017-07-19 ENCOUNTER — Encounter (HOSPITAL_COMMUNITY): Payer: Self-pay | Admitting: Obstetrics and Gynecology

## 2017-07-19 DIAGNOSIS — N814 Uterovaginal prolapse, unspecified: Secondary | ICD-10-CM | POA: Diagnosis not present

## 2017-07-19 LAB — CBC
HEMATOCRIT: 34.6 % — AB (ref 36.0–46.0)
HEMOGLOBIN: 11.4 g/dL — AB (ref 12.0–15.0)
MCH: 30.8 pg (ref 26.0–34.0)
MCHC: 32.9 g/dL (ref 30.0–36.0)
MCV: 93.5 fL (ref 78.0–100.0)
Platelets: 213 10*3/uL (ref 150–400)
RBC: 3.7 MIL/uL — ABNORMAL LOW (ref 3.87–5.11)
RDW: 12.4 % (ref 11.5–15.5)
WBC: 14.4 10*3/uL — ABNORMAL HIGH (ref 4.0–10.5)

## 2017-07-19 MED ORDER — HYDROMORPHONE HCL 2 MG PO TABS: 2.0000 mg | ORAL_TABLET | ORAL | 0 refills | Status: DC | PRN

## 2017-07-19 NOTE — Op Note (Signed)
NAMLovina Reach:  Webb, Brenda                 ACCOUNT NO.:  [id number]  MEDICAL RECORD NO.:  [id number]  LOCATION:                                 FACILITY:  PHYSICIAN:  Malva LimesMark Anderson, M.D.    DATE OF BIRTH:  04/25/1986  DATE OF PROCEDURE:  07/18/2017 DATE OF DISCHARGE:                              OPERATIVE REPORT   PREOPERATIVE DIAGNOSIS:  Symptomatic prolapse.  POSTOPERATIVE DIAGNOSIS:  Symptomatic prolapse.  PROCEDURE:  Total vaginal hysterectomy.  SURGEON:  Malva LimesMark Anderson, M.D.  ASSISTANKemper Durie:  [place].  ANESTHESIA:  General and local.  ANTIBIOTICS:  Ancef 2 g.  DRAINS:  Foley bedside drainage.  ESTIMATED BLOOD LOSS:  100 mL.  COMPLICATIONS:  None.  FINDINGS:  The patient had normal fallopian tubes and ovaries bilaterally.  There was evidence of a past tubal ligation with a Filshie clip and the uterus and cervix appeared to be normal.  DESCRIPTION OF PROCEDURE:  The patient was taken to the operating room where she was placed in dorsal supine position.  A general anesthetic was administered without difficulty.  She was then placed in dorsal lithotomy position.  She was prepped and draped in the usual fashion for this procedure.  An exam under anesthesia revealed first-degree uterine prolapse.  There was no evidence of any pelvic masses.  Nothing had changed from the previous pelvic exam.  At this point, a weighted speculum was placed in the vagina.  10 mL of 1% lidocaine with epinephrine was used for paracervical block.  Posterior cul-de-sac was entered sharply.  Uterosacral ligaments were bilaterally clamped, cut, and ligated with 0 Monocryl suture.  Cervix was then circumscribed.  The bladder pillars were bilaterally clamped, cut, and ligated with 0 Monocryl suture.  The anterior cul-de-sac was then entered sharply.  The cardinal ligaments were sterilely clamped, cut, and ligated with 0 Monocryl suture.  The uterine arteries were bilaterally clamped, cut, and ligated with 0  Monocryl suture.  Once the level of the fallopian tube was reached, the triple pedicle which included the ovarian ligament, fallopian tube, and round ligament were clamped, cut, and ligated x2 with 0 Monocryl suture.  Pedicles were then checked and made hemostatic.  The posterior cuff was then closed using 2-0 Vicryl in a running locking fashion.  A Foley catheter was placed in the bladder. The urine was noted to be clear.  At this point, the uterosacral ligaments were plicated in midline with 0 Monocryl suture.  The vaginal cuff was closed using 2-0 Vicryl in a running locking fashion. The cuff was closed vertically.  Hemostasis appeared to be adequate. This concluded the procedure.  The patient was taken to the recovery room in stable condition.  Instrument and lap count were correct x3.          ______________________________ Malva LimesMark Anderson, M.D.     MA/MEDQ  D:  07/18/2017  T:  07/18/2017  Job:  [id number]

## 2017-07-19 NOTE — Plan of Care (Signed)
Problem: Urinary Elimination: Goal: Ability to reestablish a normal urinary elimination pattern will improve Outcome: Completed/Met Date Met: 07/19/17 Patient has voided 3 times with 2 being larger that 200.  Patient feels she is emptying bladder completely

## 2017-07-19 NOTE — Progress Notes (Signed)
POD#1 Pt had difficulty with pain control yesterday. Doing much better today. Tolerating diet. Light vaginal spotting. VSSAF  Abd- soft, non tender, no rebound or guarding., Minimal distension [id number] IMP/ Stable Plan/ Will discharge to home           RTO 2 weeks

## 2017-07-23 NOTE — Discharge Summary (Signed)
Pt is a 31 y/o white female, [id number] who was admitted for a TVH secondary to symptomatic uterine prolapse. She underwent an uncomplicated procedure which is dictated in the op note. Her post op course was benign . Hgb was stable. Path was benign. She was discharged on POD #1 and instructed to return to office in 2 weeks . Tolerated diet. Adequate pain control

## 2018-04-05 ENCOUNTER — Encounter: Payer: Self-pay | Admitting: Emergency Medicine

## 2018-04-05 ENCOUNTER — Emergency Department (INDEPENDENT_AMBULATORY_CARE_PROVIDER_SITE_OTHER)
Admission: EM | Admit: 2018-04-05 | Discharge: 2018-04-05 | Disposition: A | Discharge status: Home / Self Care | Payer: BLUE CROSS/BLUE SHIELD | Source: Home / Self Care | Attending: Family Medicine | Admitting: Family Medicine

## 2018-04-05 ENCOUNTER — Other Ambulatory Visit: Payer: Self-pay

## 2018-04-05 DIAGNOSIS — M25551 Pain in right hip: Secondary | ICD-10-CM

## 2018-04-05 DIAGNOSIS — L4050 Arthropathic psoriasis, unspecified: Secondary | ICD-10-CM | POA: Diagnosis not present

## 2018-04-05 MED ORDER — METHYLPREDNISOLONE ACETATE 80 MG/ML IJ SUSP
80.0000 mg | Freq: Once | INTRAMUSCULAR | Status: AC
  Administered 2018-04-05: 80 mg via INTRAMUSCULAR

## 2018-04-05 NOTE — ED Provider Notes (Signed)
[facility]    CSN: [id number] Arrival date & time: 04/05/18  1716     History   Chief Complaint Chief Complaint  Patient presents with  . Hip Pain  . Back Pain    HPI Brenda Webb is a 32 y.o. female.   Patient has psoriatic arthritis and did some activities today that caused pain in her lower back and right hip.  She requests a Depomedrol injection which she has received in the past from her rheumatologist for similar occurrences.  The history is provided by the patient.    Past Medical History:  Diagnosis Date  . Anxiety   . Arthritis   . Complication of anesthesia    "wakes up during anesthesia"  . Depression   . History of kidney stones    no surgery required - passed stone  . Psoriatic arthritis ([place])   . SVD (spontaneous vaginal delivery)    x 2    Patient Active Problem List   Diagnosis Date Noted  . Prolapse of uterus 07/18/2017  . Active labor at term 09/09/2015  . Postpartum state 09/09/2015  . Gestational hypertension, antepartum 08/23/2015  . Pregnancy with nephrolithiasis in second trimester 06/22/2015  . Pyelonephritis affecting pregnancy in second trimester, antepartum 04/21/2015    Past Surgical History:  Procedure Laterality Date  . CHOLECYSTECTOMY  11/12/2011   Procedure: LAPAROSCOPIC CHOLECYSTECTOMY;  Surgeon: Gayland Curry, MD;  Location: [facility];  Service: General;  Laterality: N/A;  . HYSTEROSCOPY WITH NOVASURE N/A 03/20/2016   Procedure: Dilatation and Currettage, HYSTEROSCOPY WITH NOVASURE;  Surgeon: Olga Millers, MD;  Location: [facility];  Service: Gynecology;  Laterality: N/A;  . TONSILLECTOMY  2004  . TUBAL LIGATION Bilateral 09/10/2015   Procedure: POST PARTUM TUBAL LIGATION;  Surgeon: Bobbye Charleston, MD;  Location: [facility];  Service: Gynecology;  Laterality: Bilateral;  . VAGINAL HYSTERECTOMY N/A 07/18/2017   Procedure: HYSTERECTOMY VAGINAL;  Surgeon: Olga Millers, MD;  Location: [facility];  Service: Gynecology;   Laterality: N/A;  . WISDOM TOOTH EXTRACTION      OB History    Gravida  3   Para  2   Term  2   Preterm  0   AB  1   Living  1     SAB  1   TAB  0   Ectopic  0   Multiple  0   Live Births  1            Home Medications    Prior to Admission medications   Medication Sig Start Date End Date Taking? Authorizing Provider  diclofenac sodium (VOLTAREN) 1 % GEL Apply topically 4 (four) times daily as needed.   Yes [provider]  HYDROcodone-Acetaminophen 7.5-300 MG TABS Take by mouth.   Yes [provider]  Certolizumab Pegol (CIMZIA) 2 X 200 MG KIT Inject 200 mg into the skin See admin instructions. Inject into stomach twice monthly    [provider]  cyclobenzaprine (FLEXERIL) 10 MG tablet Take 10 mg by mouth daily as needed for muscle spasms.    [provider]  HYDROmorphone (DILAUDID) 2 MG tablet Take 1 tablet (2 mg total) by mouth every 4 (four) hours as needed for severe pain. 07/19/17   Olga Millers, MD  methotrexate (RHEUMATREX) 2.5 MG tablet Take 25 mg by mouth every Friday. Caution:Chemotherapy. Protect from light.    [provider]  senna (SENOKOT) 8.6 MG TABS tablet Take 1 tablet by mouth  daily as needed for mild constipation.    [provider]    Family History Family History  Problem Relation Age of Onset  . Cancer Paternal Grandfather        bladder    Social History Social History   Tobacco Use  . Smoking status: Former Smoker    Packs/day: 0.25    Years: 5.00    Pack years: 1.25    Types: Cigarettes  . Smokeless tobacco: Never Used  . Tobacco comment: quit approx 10 yrs ago  Substance Use Topics  . Alcohol use: No  . Drug use: No     Allergies   Shrimp [shellfish allergy]   Review of Systems Review of Systems  Constitutional: Positive for activity change. Negative for chills, diaphoresis, fatigue and fever.  HENT: Negative.   Eyes: Negative.   Respiratory: Negative.    Cardiovascular: Negative.   Gastrointestinal: Negative.   Genitourinary: Negative.   Musculoskeletal: Positive for arthralgias and back pain. Negative for joint swelling and myalgias.  Skin: Negative.   Neurological: Negative for headaches.     Physical Exam Triage Vital Signs ED Triage Vitals  Enc Vitals Group     BP 04/05/18 1753 110/65     Pulse Rate 04/05/18 1753 84     Resp 04/05/18 1753 18     Temp 04/05/18 1753 98.7 F (37.1 C)     Temp Source 04/05/18 1753 Oral     SpO2 04/05/18 1753 100 %     Weight 04/05/18 1754 167 lb (75.8 kg)     Height 04/05/18 1754 _0  (1.727 m)     Head Circumference --      Peak Flow --      Pain Score 04/05/18 1754 5     Pain Loc --      Pain Edu? --      Excl. in [place]? --    No data found.  Updated Vital Signs BP 110/65 (BP Location: Right Arm)   Pulse 84   Temp 98.7 F (37.1 C) (Oral)   Resp 18   Ht _1  (1.727 m)   Wt 167 lb (75.8 kg)   LMP 12/21/2014 (Approximate)   SpO2 100%   BMI 25.39 kg/m   Visual Acuity Right Eye Distance:   Left Eye Distance:   Bilateral Distance:    Right Eye Near:   Left Eye Near:    Bilateral Near:     Physical Exam Nursing notes and Vital Signs reviewed. Appearance:  Patient appears stated age, and in no acute distress.    Eyes:  Pupils are equal, round, and reactive to light and accomodation.  Extraocular movement is intact.  Conjunctivae are not inflamed   Pharynx:  Normal; moist mucous membranes  Neck:  Supple.  No adenopathy Lungs:  Clear to auscultation.  Breath sounds are equal.  Moving air well. Heart:  Regular rate and rhythm without murmurs, rubs, or gallops.  Abdomen:  Nontender without masses or hepatosplenomegaly.  Bowel sounds are present.  No CVA or flank tenderness.  Extremities:  No edema.  Right hip has decreased range of motion without tenderness to palpation. Back:  Vague mild tenderness to palpation over SI joints. Skin:  No rash present.     UC Treatments /  Results  Labs (all labs ordered are listed, but only abnormal results are displayed) Labs Reviewed - No data to display  EKG None Radiology No results found.  Procedures Procedures (including critical care time)  Medications Ordered in UC Medications  methylPREDNISolone acetate (DEPO-MEDROL) injection 80 mg (80 mg Intramuscular Given 04/05/18 1846)     Initial Impression / Assessment and Plan / UC Course  I have reviewed the triage vital signs and the nursing notes.  Pertinent labs & imaging results that were available during my care of the patient were reviewed by me and considered in my medical decision making (see chart for details).    Administered Depo Medrol 11m IM Followup with rheumatologist    Final Clinical Impressions(s) / UC Diagnoses   Final diagnoses:  Psoriatic arthritis ([place]  Right hip pain    ED Discharge Orders    None         BKandra Nicolas MD 04/13/18 1230

## 2018-04-05 NOTE — ED Triage Notes (Signed)
Patient has chronic psoriatic arthritis and today did some activities that caused pain in lower back and right hip; she has done this before and is requesting a depo-medrol injection which she gets from her rheumatologist under similar conditions.

## 2018-08-19 ENCOUNTER — Encounter (INDEPENDENT_AMBULATORY_CARE_PROVIDER_SITE_OTHER): Payer: Self-pay | Admitting: Orthopaedic Surgery

## 2018-08-19 ENCOUNTER — Ambulatory Visit (INDEPENDENT_AMBULATORY_CARE_PROVIDER_SITE_OTHER): Payer: BLUE CROSS/BLUE SHIELD

## 2018-08-19 ENCOUNTER — Ambulatory Visit (INDEPENDENT_AMBULATORY_CARE_PROVIDER_SITE_OTHER): Payer: Self-pay | Admitting: Orthopaedic Surgery

## 2018-08-19 DIAGNOSIS — M25551 Pain in right hip: Secondary | ICD-10-CM

## 2018-08-19 NOTE — Progress Notes (Signed)
Office Visit Note   Patient: Brenda Webb           Date of Birth: [birth year]           MRN: [id number] Visit Date: 08/19/2018              Requested by: Eartha Inch, MD [address] [place], [place] [address] PCP: Eartha Inch, MD   Assessment & Plan: Visit Diagnoses:  1. Pain in right hip     Plan: Impression is right hip labral tear.  Previous MRI was noncontrasted.  At this point we will order MRI arthrogram to fully evaluate for labral tear.  Follow-up afterwards to discuss MRI findings and treatment options.  Follow-Up Instructions: Return if symptoms worsen or fail to improve.   Orders:  Orders Placed This Encounter  Procedures  . XR HIP UNILAT W OR W/O PELVIS 2-3 VIEWS RIGHT   No orders of the defined types were placed in this encounter.     Procedures: No procedures performed   Clinical Data: No additional findings.   Subjective: Chief Complaint  Patient presents with  . Right Hip - Pain    Brenda Webb is a 32 year old female comes in with chronic right hip and groin pain since 2016.  She is an avid runner.  She has occasional pain at rest but mainly with hip flexion especially when she puts on her close.  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  She has had oral prednisone which has helped some.  Denies any radicular symptoms.  Denies any numbness and tingling.  Denies any back pain.   Review of Systems  Constitutional: Negative.   HENT: Negative.   Eyes: Negative.   Respiratory: Negative.   Cardiovascular: Negative.   Endocrine: Negative.   Musculoskeletal: Negative.   Neurological: Negative.   Hematological: Negative.   Psychiatric/Behavioral: Negative.   All other systems reviewed and are negative.    Objective: Vital Signs: LMP 12/21/2014 (Approximate)   Physical Exam  Constitutional: She is oriented to person, place, and time. She appears well-developed and well-nourished.  HENT:  Head:  Normocephalic and atraumatic.  Eyes: EOM are normal.  Neck: Neck supple.  Pulmonary/Chest: Effort normal.  Abdominal: Soft.  Neurological: She is alert and oriented to person, place, and time.  Skin: Skin is warm. Capillary refill takes less than 2 seconds.  Psychiatric: She has a normal mood and affect. Her behavior is normal. Judgment and thought content normal.  Nursing note and vitals reviewed.   Ortho Exam Right hip exam shows pain with hip flexion and external rotation internal rotation.  No sciatic tension signs.  Mildly positive Stinchfield sign.  Trochanteric bursa is nontender.  Pubic symphysis is nontender. Specialty Comments:  No specialty comments available.  Imaging: Xr Hip Unilat W Or W/o Pelvis 2-3 Views Right  Result Date: 08/19/2018 No acute or structural abnormalities    PMFS History: Patient Active Problem List   Diagnosis Date Noted  . Prolapse of uterus 07/18/2017  . Active labor at term 09/09/2015  . Postpartum state 09/09/2015  . Gestational hypertension, antepartum 08/23/2015  . Pregnancy with nephrolithiasis in second trimester 06/22/2015  . Pyelonephritis affecting pregnancy in second trimester, antepartum 04/21/2015   Past Medical History:  Diagnosis Date  . Anxiety   . Arthritis   . Complication of anesthesia    "wakes up during anesthesia"  . Depression   . History of kidney stones    no surgery  required - passed stone  . Psoriatic arthritis ([facility])   . SVD (spontaneous vaginal delivery)    x 2    Family History  Problem Relation Age of Onset  . Cancer Paternal Grandfather        bladder    Past Surgical History:  Procedure Laterality Date  . CHOLECYSTECTOMY  11/12/2011   Procedure: LAPAROSCOPIC CHOLECYSTECTOMY;  Surgeon: Atilano Ina, MD;  Location: [facility];  Service: General;  Laterality: N/A;  . HYSTEROSCOPY WITH NOVASURE N/A 03/20/2016   Procedure: Dilatation and Currettage, HYSTEROSCOPY WITH NOVASURE;  Surgeon: Levi Aland, MD;   Location: [facility];  Service: Gynecology;  Laterality: N/A;  . TONSILLECTOMY  2004  . TUBAL LIGATION Bilateral 09/10/2015   Procedure: POST PARTUM TUBAL LIGATION;  Surgeon: Carrington Clamp, MD;  Location: [facility];  Service: Gynecology;  Laterality: Bilateral;  . VAGINAL HYSTERECTOMY N/A 07/18/2017   Procedure: HYSTERECTOMY VAGINAL;  Surgeon: Levi Aland, MD;  Location: [facility];  Service: Gynecology;  Laterality: N/A;  . WISDOM TOOTH EXTRACTION     Social History   Occupational History  . Not on file  Tobacco Use  . Smoking status: Former Smoker    Packs/day: 0.25    Years: 5.00    Pack years: 1.25    Types: Cigarettes  . Smokeless tobacco: Never Used  . Tobacco comment: quit approx 10 yrs ago  Substance and Sexual Activity  . Alcohol use: No  . Drug use: No  . Sexual activity: Yes    Birth control/protection: Surgical

## 2018-08-19 NOTE — Addendum Note (Signed)
Addended by: Albertina Parr on: 08/19/2018 03:40 PM   Modules accepted: Orders

## 2018-09-02 ENCOUNTER — Ambulatory Visit (INDEPENDENT_AMBULATORY_CARE_PROVIDER_SITE_OTHER): Payer: BLUE CROSS/BLUE SHIELD | Admitting: Orthopaedic Surgery

## 2018-09-10 ENCOUNTER — Telehealth (INDEPENDENT_AMBULATORY_CARE_PROVIDER_SITE_OTHER): Payer: Self-pay | Admitting: Orthopaedic Surgery

## 2018-09-10 NOTE — Telephone Encounter (Signed)
9/3 OV note faxed to [facility] @ [facility] [phone]

## 2018-09-24 ENCOUNTER — Ambulatory Visit
Admission: RE | Admit: 2018-09-24 | Discharge: 2018-09-24 | Disposition: A | Discharge status: Home / Self Care | Payer: BLUE CROSS/BLUE SHIELD | Source: Ambulatory Visit | Attending: Orthopaedic Surgery | Admitting: Orthopaedic Surgery

## 2018-09-24 DIAGNOSIS — M25551 Pain in right hip: Secondary | ICD-10-CM

## 2018-09-24 MED ORDER — IOPAMIDOL (ISOVUE-M 200) INJECTION 41%
12.0000 mL | Freq: Once | INTRAMUSCULAR | Status: AC
  Administered 2018-09-24: 12 mL via INTRA_ARTICULAR

## 2018-09-30 ENCOUNTER — Encounter (INDEPENDENT_AMBULATORY_CARE_PROVIDER_SITE_OTHER): Payer: Self-pay | Admitting: Orthopaedic Surgery

## 2018-09-30 ENCOUNTER — Ambulatory Visit (INDEPENDENT_AMBULATORY_CARE_PROVIDER_SITE_OTHER): Payer: BLUE CROSS/BLUE SHIELD | Admitting: Orthopaedic Surgery

## 2018-09-30 DIAGNOSIS — S73191A Other sprain of right hip, initial encounter: Secondary | ICD-10-CM | POA: Diagnosis not present

## 2018-09-30 NOTE — Progress Notes (Signed)
Office Visit Note   Patient: Brenda Webb           Date of Birth: [birth year]           MRN: [id number] Visit Date: 09/30/2018              Requested by: Brenda Inch, MD [address] [place], [place] [address] PCP: Brenda Inch, MD   Assessment & Plan: Visit Diagnoses:  1. Tear of right acetabular labrum, initial encounter     Plan: MRI is consistent with a anterior superior labral tear of the right hip.  Referral to Dr[facility] Caswell Webb at [facility].  Follow up as needed.  Follow-Up Instructions: Return if symptoms worsen or fail to improve.   Orders:  Orders Placed This Encounter  Procedures  . Ambulatory referral to Orthopedic Surgery   No orders of the defined types were placed in this encounter.     Procedures: No procedures performed   Clinical Data: No additional findings.   Subjective: Chief Complaint  Patient presents with  . Right Hip - Pain    Brenda Webb comes in today for MRI review of her right hip.  She has had multiple previous cortisone injections that have each given 2 to 4 months of relief.   Review of Systems   Objective: Vital Signs: LMP 12/21/2014 (Approximate)   Physical Exam  Ortho Exam Right hip exam stable. Specialty Comments:  No specialty comments available.  Imaging: No results found.   PMFS History: Patient Active Problem List   Diagnosis Date Noted  . Prolapse of uterus 07/18/2017  . Active labor at term 09/09/2015  . Postpartum state 09/09/2015  . Gestational hypertension, antepartum 08/23/2015  . Pregnancy with nephrolithiasis in second trimester 06/22/2015  . Pyelonephritis affecting pregnancy in second trimester, antepartum 04/21/2015   Past Medical History:  Diagnosis Date  . Anxiety   . Arthritis   . Complication of anesthesia    "wakes up during anesthesia"  . Depression   . History of kidney stones    no surgery required - passed stone  . Psoriatic arthritis ([facility])   . SVD (spontaneous vaginal  delivery)    x 2    Family History  Problem Relation Age of Onset  . Cancer Paternal Grandfather        bladder    Past Surgical History:  Procedure Laterality Date  . CHOLECYSTECTOMY  11/12/2011   Procedure: LAPAROSCOPIC CHOLECYSTECTOMY;  Surgeon: Atilano Ina, MD;  Location: [facility];  Service: General;  Laterality: N/A;  . HYSTEROSCOPY WITH NOVASURE N/A 03/20/2016   Procedure: Dilatation and Currettage, HYSTEROSCOPY WITH NOVASURE;  Surgeon: Levi Aland, MD;  Location: [facility];  Service: Gynecology;  Laterality: N/A;  . TONSILLECTOMY  2004  . TUBAL LIGATION Bilateral 09/10/2015   Procedure: POST PARTUM TUBAL LIGATION;  Surgeon: Carrington Clamp, MD;  Location: [facility];  Service: Gynecology;  Laterality: Bilateral;  . VAGINAL HYSTERECTOMY N/A 07/18/2017   Procedure: HYSTERECTOMY VAGINAL;  Surgeon: Levi Aland, MD;  Location: [facility];  Service: Gynecology;  Laterality: N/A;  . WISDOM TOOTH EXTRACTION     Social History   Occupational History  . Not on file  Tobacco Use  . Smoking status: Former Smoker    Packs/day: 0.25    Years: 5.00    Pack years: 1.25    Types: Cigarettes  . Smokeless tobacco: Never Used  . Tobacco comment: quit approx 10 yrs ago  Substance and Sexual Activity  .  Alcohol use: No  . Drug use: No  . Sexual activity: Yes    Birth control/protection: Surgical

## 2018-10-02 ENCOUNTER — Telehealth (INDEPENDENT_AMBULATORY_CARE_PROVIDER_SITE_OTHER): Payer: Self-pay | Admitting: Orthopaedic Surgery

## 2018-10-02 NOTE — Telephone Encounter (Signed)
Patient has been referred to Dr. Caswell Corwin and needs her right hip x-rays on a disc to take to her appointment. She is getting her MRI disc from [facility]. She would like to pick the CD up tomorrow afternoon. She can fill out medical release form when she gets here. Patients # [phone]

## 2018-10-03 ENCOUNTER — Other Ambulatory Visit (INDEPENDENT_AMBULATORY_CARE_PROVIDER_SITE_OTHER): Payer: Self-pay

## 2018-10-03 DIAGNOSIS — S73191A Other sprain of right hip, initial encounter: Secondary | ICD-10-CM

## 2018-10-03 NOTE — Telephone Encounter (Signed)
Please make cd for patient, thanks

## 2018-10-03 NOTE — Telephone Encounter (Signed)
CD made and pt notified on 10/17

## 2018-10-15 ENCOUNTER — Telehealth (INDEPENDENT_AMBULATORY_CARE_PROVIDER_SITE_OTHER): Payer: Self-pay | Admitting: Orthopaedic Surgery

## 2018-10-15 NOTE — Telephone Encounter (Signed)
09/30/18 ov note & MR report faxed to [facility] @ [facility] [phone]

## 2018-11-06 ENCOUNTER — Telehealth (INDEPENDENT_AMBULATORY_CARE_PROVIDER_SITE_OTHER): Payer: Self-pay | Admitting: Orthopaedic Surgery

## 2018-11-06 NOTE — Telephone Encounter (Signed)
Brenda CarnesAdvised Angel w/ [facility] that pts last ov with us was 10/15

## 2019-10-23 ENCOUNTER — Other Ambulatory Visit: Payer: Self-pay

## 2019-10-23 DIAGNOSIS — Z20822 Contact with and (suspected) exposure to covid-19: Secondary | ICD-10-CM

## 2019-10-25 LAB — NOVEL CORONAVIRUS, NAA: SARS-CoV-2, NAA: NOT DETECTED

## 2022-01-12 ENCOUNTER — Encounter (HOSPITAL_BASED_OUTPATIENT_CLINIC_OR_DEPARTMENT_OTHER): Payer: Self-pay

## 2022-01-12 ENCOUNTER — Other Ambulatory Visit: Payer: Self-pay

## 2022-01-12 ENCOUNTER — Emergency Department (HOSPITAL_BASED_OUTPATIENT_CLINIC_OR_DEPARTMENT_OTHER)
Admission: EM | Admit: 2022-01-12 | Discharge: 2022-01-12 | Disposition: A | Discharge status: Home / Self Care | Payer: 59 | Attending: Emergency Medicine | Admitting: Emergency Medicine

## 2022-01-12 ENCOUNTER — Emergency Department (HOSPITAL_BASED_OUTPATIENT_CLINIC_OR_DEPARTMENT_OTHER): Payer: 59

## 2022-01-12 DIAGNOSIS — R911 Solitary pulmonary nodule: Secondary | ICD-10-CM | POA: Diagnosis not present

## 2022-01-12 DIAGNOSIS — Z87891 Personal history of nicotine dependence: Secondary | ICD-10-CM | POA: Diagnosis not present

## 2022-01-12 DIAGNOSIS — R002 Palpitations: Secondary | ICD-10-CM | POA: Insufficient documentation

## 2022-01-12 DIAGNOSIS — Z20822 Contact with and (suspected) exposure to covid-19: Secondary | ICD-10-CM | POA: Diagnosis not present

## 2022-01-12 DIAGNOSIS — Z79899 Other long term (current) drug therapy: Secondary | ICD-10-CM | POA: Insufficient documentation

## 2022-01-12 DIAGNOSIS — R079 Chest pain, unspecified: Secondary | ICD-10-CM | POA: Diagnosis present

## 2022-01-12 LAB — CBC
HCT: 39.9 % (ref 36.0–46.0)
Hemoglobin: 12.8 g/dL (ref 12.0–15.0)
MCH: 29.7 pg (ref 26.0–34.0)
MCHC: 32.1 g/dL (ref 30.0–36.0)
MCV: 92.6 fL (ref 80.0–100.0)
Platelets: 273 10*3/uL (ref 150–400)
RBC: 4.31 MIL/uL (ref 3.87–5.11)
RDW: 12.8 % (ref 11.5–15.5)
WBC: 6.3 10*3/uL (ref 4.0–10.5)
nRBC: 0 % (ref 0.0–0.2)

## 2022-01-12 LAB — BASIC METABOLIC PANEL
Anion gap: 8 (ref 5–15)
BUN: 13 mg/dL (ref 6–20)
CO2: 25 mmol/L (ref 22–32)
Calcium: 9.9 mg/dL (ref 8.9–10.3)
Chloride: 104 mmol/L (ref 98–111)
Creatinine, Ser: 0.76 mg/dL (ref 0.44–1.00)
GFR, Estimated: >60 mL/min (ref >60)
Glucose, Bld: 88 mg/dL (ref 70–99)
Potassium: 3.8 mmol/L (ref 3.5–5.1)
Sodium: 137 mmol/L (ref 135–145)

## 2022-01-12 LAB — RESP PANEL BY RT-PCR (FLU A&B, COVID) ARPGX2
Influenza A by PCR: NEGATIVE
Influenza B by PCR: NEGATIVE
SARS Coronavirus 2 by RT PCR: NEGATIVE

## 2022-01-12 MED ORDER — IOHEXOL 350 MG/ML SOLN
100.0000 mL | Freq: Once | INTRAVENOUS | Status: AC | PRN
  Administered 2022-01-12: 67 mL via INTRAVENOUS

## 2022-01-12 NOTE — ED Provider Notes (Signed)
[facility] EMERGENCY DEPT Provider Note   CSN: [id number] Arrival date & time: 01/12/22  1526     History  Chief Complaint  Patient presents with   Chest Pain    Brenda Webb is a 36 y.o. female.  Patient with a complaint of chest pain discomfort fluttering in the chest.  Ongoing for several days.  Patient seen at [facility] yesterday with same complaint.  They did troponins and they did a thyroid work-up without any significant findings.  Did not do COVID or influenza testing.  Patient states she has been having some night sweats.  Patient is concerned that she has a blood clot in her lungs.  Because her father died from a blood clot.  She was in contact with her primary care doctor that sent her in to have CT angio chest.  Symptoms are unchanged over the past several days.  Patient thinks her heart is having palpitations at times.  Past medical history patient is a former smoker.  History of depression and anxiety history of kidney stones.  Patient's had a vaginal hysterectomy.      Home Medications Prior to Admission medications   Medication Sig Start Date End Date Taking? Authorizing Provider  Certolizumab Pegol (CIMZIA) 2 X 200 MG KIT Inject 200 mg into the skin See admin instructions. Inject into stomach twice monthly    [provider]  cyclobenzaprine (FLEXERIL) 10 MG tablet Take 10 mg by mouth daily as needed for muscle spasms.    [provider]  diclofenac sodium (VOLTAREN) 1 % GEL Apply topically 4 (four) times daily as needed.    [provider]  HYDROcodone-Acetaminophen 7.5-300 MG TABS Take by mouth.    [provider]  HYDROmorphone (DILAUDID) 2 MG tablet Take 1 tablet (2 mg total) by mouth every 4 (four) hours as needed for severe pain. 07/19/17   Olga Millers, MD  methotrexate (RHEUMATREX) 2.5 MG tablet Take 25 mg by mouth every Friday. Caution:Chemotherapy. Protect from light.    [provider]  senna (SENOKOT) 8.6 MG TABS tablet Take 1 tablet by mouth daily as needed for mild constipation.    [provider]      Allergies    Shrimp [shellfish allergy]    Review of Systems   Review of Systems  Constitutional:  Negative for chills and fever.  HENT:  Negative for ear pain and sore throat.   Eyes:  Negative for pain and visual disturbance.  Respiratory:  Negative for cough and shortness of breath.   Cardiovascular:  Positive for chest pain and palpitations. Negative for leg swelling.  Gastrointestinal:  Negative for abdominal pain and vomiting.  Genitourinary:  Negative for dysuria and hematuria.  Musculoskeletal:  Negative for arthralgias and back pain.  Skin:  Negative for color change and rash.  Neurological:  Negative for seizures and syncope.  All other systems reviewed and are negative.  Physical Exam Updated Vital Signs BP 125/79    Pulse 66    Temp 98.5 F (36.9 C) (Oral)    Resp (!) 21    Ht 1.727 m (_0 )    Wt 61.7 kg    LMP 12/21/2014 (Approximate)    SpO2 100%    BMI 20.68 kg/m  Physical Exam Vitals and nursing note reviewed.  Constitutional:      General: She is not in acute distress.    Appearance: Normal appearance. She is well-developed. She is not ill-appearing or toxic-appearing.  HENT:  Head: Normocephalic and atraumatic.  Eyes:     Conjunctiva/sclera: Conjunctivae normal.     Pupils: Pupils are equal, round, and reactive to light.  Cardiovascular:     Rate and Rhythm: Normal rate and regular rhythm.     Heart sounds: No murmur heard. Pulmonary:     Effort: Pulmonary effort is normal. No respiratory distress.     Breath sounds: Normal breath sounds.  Abdominal:     Palpations: Abdomen is soft.     Tenderness: There is no abdominal tenderness.  Musculoskeletal:        General: No swelling.     Cervical back: Normal range of motion and neck supple.  Skin:    General: Skin is warm and dry.     Capillary Refill:  Capillary refill takes less than 2 seconds.  Neurological:     General: No focal deficit present.     Mental Status: She is alert and oriented to person, place, and time.     Cranial Nerves: No cranial nerve deficit.     Sensory: No sensory deficit.     Motor: No weakness.  Psychiatric:        Mood and Affect: Mood normal.    ED Results / Procedures / Treatments   Labs (all labs ordered are listed, but only abnormal results are displayed) Labs Reviewed  RESP PANEL BY RT-PCR (FLU A&B, COVID) ARPGX2  CBC  BASIC METABOLIC PANEL    EKG EKG Interpretation  Date/Time:  Friday January 12 2022 18:15:49 EST Ventricular Rate:  63 PR Interval:  124 QRS Duration: 91 QT Interval:  407 QTC Calculation: 417 R Axis:   42 Text Interpretation: Sinus rhythm Confirmed by Fredia Sorrow [id number] on 01/12/2022 6:22:00 PM  Radiology CT Angio Chest PE W/Cm &/Or Wo Cm  Result Date: 01/12/2022 CLINICAL DATA:  Chest pain. EXAM: CT ANGIOGRAPHY CHEST WITH CONTRAST TECHNIQUE: Multidetector CT imaging of the chest was performed using the standard protocol during bolus administration of intravenous contrast. Multiplanar CT image reconstructions and MIPs were obtained to evaluate the vascular anatomy. RADIATION DOSE REDUCTION: This exam was performed according to the departmental dose-optimization program which includes automated exposure control, adjustment of the mA and/or kV according to patient size and/or use of iterative reconstruction technique. CONTRAST:  29m OMNIPAQUE IOHEXOL 350 MG/ML SOLN COMPARISON:  June 23, 2015. FINDINGS: Cardiovascular: Satisfactory opacification of the pulmonary arteries to the segmental level. No evidence of pulmonary embolism. Normal heart size. No pericardial effusion. Mediastinum/Nodes: No enlarged mediastinal, hilar, or axillary lymph nodes. Thyroid gland, trachea, and esophagus demonstrate no significant findings. Lungs/Pleura: Pneumothorax or pleural effusion is noted. No  consolidative process is noted. 3 mm nodule is noted anteriorly in right middle lobe best seen on image number 79 of series 6. Also noted is 3 mm nodule seen in left lower lobe best seen on image number 109 of series 6, which was present on prior exam of 2016. Upper Abdomen: No acute abnormality. Musculoskeletal: No chest wall abnormality. No acute or significant osseous findings. Review of the MIP images confirms the above findings. IMPRESSION: No definite evidence of pulmonary embolus. Stable 3 mm nodule seen in left lower lobe which can be considered benign at this point. 3 mm nodule is noted in right middle lobe which was not included in field of view of prior exam. No follow-up needed if patient is low-risk. Non-contrast chest CT can be considered in 12 months if patient is high-risk. This recommendation follows the consensus statement: Guidelines for  Management of Incidental Pulmonary Nodules Detected on CT Images: From the Fleischner Society 2017; Radiology 2017; 284:228-243. Electronically Signed   By: Marijo Conception M.D.   On: 01/12/2022 18:07    Procedures Procedures  Cardiac monitoring without any arrhythmias.  Except for right here near time of discharge it was raising concern for atrial fibs we did a twelve-lead EKG on top of her original 1.  The second 1 is a heart rate of 82 was clearly sinus rhythm definitely not atrial fibrillation.  Medications Ordered in ED Medications  iohexol (OMNIPAQUE) 350 MG/ML injection 100 mL (67 mLs Intravenous Contrast Given 01/12/22 1743)    ED Course/ Medical Decision Making/ A&P                           Medical Decision Making Amount and/or Complexity of Data Reviewed Labs: ordered. Radiology: ordered.  Risk Prescription drug management.  Patient's work-up for the palpitations chest discomfort and fluttering without any acute findings.  COVID testing influenza testing negative.  No leukocytosis hemoglobin is normal.  CT angio chest other than  some mild pulmonary nodules which most likely benign at her age without any significant findings.  We will have her follow-up with her primary care doctor for this.  Basic metabolic panel is normal.  Patient can follow back up with primary care doctor.  Will give referral to cardiology for follow-up as well in case they want to consider home monitoring.  But no evidence of arrhythmia here today.  Patient was seen at [facility] yesterday had negative troponins.  No new or worse symptoms.  Did not need repeat troponins here today.  Final Clinical Impression(s) / ED Diagnoses Final diagnoses:  Palpitations    Rx / DC Orders ED Discharge Orders     None         Fredia Sorrow, MD 01/12/22 1843

## 2022-01-12 NOTE — Discharge Instructions (Signed)
Make an appointment to follow-up with cardiology and primary care doctor.  CT angio chest shows some pulmonary nodules considered to be benign at this point in time.  But your primary care doctor can do appropriate follow-up for this.  No evidence any blood clots in the lungs.  No other significant abnormalities labs were normal.  Cardiac monitoring here without any arrhythmias.

## 2022-01-12 NOTE — ED Triage Notes (Signed)
Pt came in POV with c/o of chest pain/discomfort (fluttering). Pt was seen at [facility] yesterday with same c/o. Pt is also having nightsweaS. Pt PCP recommenced her to come to ER since [facility] did not run a d-dimer/CT-Angio and her higher risk for blood clots since and pts dad past away from a blood clot.

## 2022-01-29 ENCOUNTER — Ambulatory Visit (INDEPENDENT_AMBULATORY_CARE_PROVIDER_SITE_OTHER): Payer: 59 | Admitting: Cardiology

## 2022-01-29 ENCOUNTER — Ambulatory Visit (INDEPENDENT_AMBULATORY_CARE_PROVIDER_SITE_OTHER): Payer: 59

## 2022-01-29 ENCOUNTER — Other Ambulatory Visit: Payer: Self-pay

## 2022-01-29 ENCOUNTER — Encounter (HOSPITAL_BASED_OUTPATIENT_CLINIC_OR_DEPARTMENT_OTHER): Payer: Self-pay | Admitting: Cardiology

## 2022-01-29 VITALS — BP 110/60 | HR 84 | Ht 68.0 in | Wt 140.6 lb

## 2022-01-29 DIAGNOSIS — Z09 Encounter for follow-up examination after completed treatment for conditions other than malignant neoplasm: Secondary | ICD-10-CM | POA: Diagnosis not present

## 2022-01-29 DIAGNOSIS — R002 Palpitations: Secondary | ICD-10-CM | POA: Diagnosis not present

## 2022-01-29 DIAGNOSIS — R0789 Other chest pain: Secondary | ICD-10-CM | POA: Diagnosis not present

## 2022-01-29 DIAGNOSIS — Z7189 Other specified counseling: Secondary | ICD-10-CM | POA: Diagnosis not present

## 2022-01-29 NOTE — Patient Instructions (Signed)
Medication Instructions:  Your Physician recommend you continue on your current medication as directed.    *If you need a refill on your cardiac medications before your next appointment, please call your pharmacy*   Lab Work: None ordered today   Testing/Procedures: Your physician has recommended that you wear a Zio monitor.   This monitor is a medical device that records the hearts electrical activity. Doctors most often use these monitors to diagnose arrhythmias. Arrhythmias are problems with the speed or rhythm of the heartbeat. The monitor is a small device applied to your chest. You can wear one while you do your normal daily activities. While wearing this monitor if you have any symptoms to push the button and record what you felt. Once you have worn this monitor for the period of time provider prescribed (Usually 14 days), you will return the monitor device in the postage paid box. Once it is returned they will download the data collected and provide [place] with a report which the provider will then review and we will call you with those results. Important tips:  Avoid showering during the first 24 hours of wearing the monitor. Avoid excessive sweating to help maximize wear time. Do not submerge the device, no hot tubs, and no swimming pools. Keep any lotions or oils away from the patch. After 24 hours you may shower with the patch on. Take brief showers with your back facing the shower head.  Do not remove patch once it has been placed because that will interrupt data and decrease adhesive wear time. Push the button when you have any symptoms and write down what you were feeling. Once you have completed wearing your monitor, remove and place into box which has postage paid and place in your outgoing mailbox.  If for some reason you have misplaced your box then call our office and we can provide another box and/or mail it off for you.      Follow-Up: At [facility], you and your  health needs are our priority.  As part of our continuing mission to provide you with exceptional heart care, we have created designated Provider Care Teams.  These Care Teams include your primary Cardiologist (physician) and Advanced Practice Providers (APPs -  Physician Assistants and Nurse Practitioners) who all work together to provide you with the care you need, when you need it.  We recommend signing up for the patient portal called "MyChart".  Sign up information is provided on this After Visit Summary.  MyChart is used to connect with patients for Virtual Visits (Telemedicine).  Patients are able to view lab/test results, encounter notes, upcoming appointments, etc.  Non-urgent messages can be sent to your provider as well.   To learn more about what you can do with MyChart, go to [email or web address].    Your next appointment:   6 week(s)  The format for your next appointment:   In Person  Provider:   Jodelle Red, MD

## 2022-01-29 NOTE — Progress Notes (Signed)
Cardiology Office Note:    Date:  01/29/2022   ID:  Brenda Webb, DOB 10-10-1986, MRN [id number]  PCP:  Chesley Noon, MD  Cardiologist:  Buford Dresser, MD  Referring MD: Fredia Sorrow, MD   CC: new patient consultation for palpitations  History of Present Illness:    Brenda Webb is a 36 y.o. female with a hx of psoriatic arthritis, nephrolithiasis, and anxiety who is seen as a new consult at the request of Fredia Sorrow, MD for the evaluation and management of palpitations.  CV history: [name] presented to the ED 01/11/2022 with intermittent left-sided chest pain radiating around to her back and radiating distally to her LLE. She also reported associated diaphoresis and chills. She had palpitations lasting 30 seconds every 2 minutes. Troponins and thyroid work-up showed no significant findings. She was referred for outpatient follow-up with cardiology for further management of palpitations and intermittent chest pain. She returned to the ED 1/27 with similar complaints, and was concerned she had a PE. CTA chest was unremarkable aside from mild pulmonary nodules most likely benign at her age. She was referred for follow-up with her PCP and with cardiology to consider home monitoring.  Tachycardia/palpitations: -Initial onset: Started the morning of 01/11/22, she woke up and her heart was racing. Her smart watch was showing "Afib." Initially pounding palpitations, progressed to painful palpitations that felt like her heart was having spasms. For a few weeks prior she had been feeling ill, with night sweats and weight loss. -Frequency/Duration: Random, no known triggers -Associated symptoms: Pounding palpitations, painful palpitations -Aggravating/alleviating factors: Since beginning propranolol, she has been feeling much better. It seems to help her palpitations be more consistent but less frequent/severe.  -Syncope/near syncope: Near-syncope, however she is able to sit down  and wait for her symptoms to resolve. -Prior cardiac history: None -Caffeine: 1-2 cups of coffee a day, usually drinks water otherwise. Has been working on weaning herself to decaf. -Alcohol: None -Tobacco: Socially when she was younger. -Comorbidities: history of autoimmune issues -Exercise level: Works from home. Cares for her 4 children. -Labs: TSH, kidney function/electrolytes, CBC reviewed. -Cardiac ROS: no chest pain, no shortness of breath, no PND, no orthopnea, no LE edema. -Family history: Her father died of a blood clot, and her mother died from complications due to [id number].  Previously infected with COVID-19 in 08/2021.  She denies any chest pain, shortness of breath, or peripheral edema. No lightheadedness, headaches, orthopnea, or PND.  Past Medical History:  Diagnosis Date   Anxiety    Arthritis    Complication of anesthesia    "wakes up during anesthesia"   Depression    History of kidney stones    no surgery required - passed stone   Psoriatic arthritis ([place])    SVD (spontaneous vaginal delivery)    x 2    Past Surgical History:  Procedure Laterality Date   CHOLECYSTECTOMY  11/12/2011   Procedure: LAPAROSCOPIC CHOLECYSTECTOMY;  Surgeon: Gayland Curry, MD;  Location: [facility];  Service: General;  Laterality: N/A;   HYSTEROSCOPY WITH NOVASURE N/A 03/20/2016   Procedure: Dilatation and Currettage, HYSTEROSCOPY WITH NOVASURE;  Surgeon: Olga Millers, MD;  Location: [facility];  Service: Gynecology;  Laterality: N/A;   TONSILLECTOMY  2004   TUBAL LIGATION Bilateral 09/10/2015   Procedure: POST PARTUM TUBAL LIGATION;  Surgeon: Bobbye Charleston, MD;  Location: [facility];  Service: Gynecology;  Laterality: Bilateral;   VAGINAL HYSTERECTOMY N/A 07/18/2017   Procedure: HYSTERECTOMY VAGINAL;  Surgeon: Ouida Sills,  Marney Setting, MD;  Location: [facility];  Service: Gynecology;  Laterality: N/A;   WISDOM TOOTH EXTRACTION      Current Medications: Current Outpatient Medications on File Prior to  Visit  Medication Sig   cyclobenzaprine (FLEXERIL) 10 MG tablet Take 10 mg by mouth daily as needed for muscle spasms.   DULoxetine (CYMBALTA) 60 MG capsule Take 60 mg by mouth daily.   EPINEPHrine 0.3 mg/0.3 mL IJ SOAJ injection epinephrine 0.3 mg/0.3 mL injection, auto-injector   FLUoxetine (PROZAC) 20 MG capsule Take 20 mg by mouth daily.   folic acid (FOLVITE) 1 MG tablet Take 1 tablet by mouth daily at 12 noon.   HYDROcodone-Acetaminophen 7.5-300 MG TABS Take by mouth.   LINZESS 290 MCG CAPS capsule Take 290 mcg by mouth as needed.   LORazepam (ATIVAN) 0.5 MG tablet Take 1 tablet by mouth as needed.   propranolol (INDERAL) 10 MG tablet Take 10 mg by mouth 3 (three) times daily as needed.   No current facility-administered medications on file prior to visit.     Allergies:   Shrimp [shellfish allergy]   Social History   Tobacco Use   Smoking status: Former    Packs/day: 0.25    Years: 5.00    Pack years: 1.25    Types: Cigarettes   Smokeless tobacco: Never   Tobacco comments:    quit approx 10 yrs ago  Vaping Use   Vaping Use: Never used  Substance Use Topics   Alcohol use: No   Drug use: No    Family History: family history includes Cancer in her paternal grandfather.  ROS:   Please see the history of present illness.  Additional pertinent ROS: Constitutional: Negative for chills, fever. Positive for night sweats, unintentional weight loss.  HENT: Negative for ear pain and hearing loss.   Eyes: Negative for loss of vision and eye pain.  Respiratory: Negative for cough, sputum, wheezing.   Cardiovascular: See HPI. Gastrointestinal: Negative for abdominal pain, melena, and hematochezia.  Genitourinary: Negative for dysuria and hematuria.  Musculoskeletal: Negative for falls and myalgias.  Skin: Negative for itching and rash.  Neurological: Negative for focal weakness, focal sensory changes and loss of consciousness.  Endo/Heme/Allergies: Does bruise/bleed easily.      EKGs/Labs/Other Studies Reviewed:    The following studies were reviewed today:  CTA Chest 01/12/2022: COMPARISON:  June 23, 2015.   FINDINGS: Cardiovascular: Satisfactory opacification of the pulmonary arteries to the segmental level. No evidence of pulmonary embolism. Normal heart size. No pericardial effusion.   Mediastinum/Nodes: No enlarged mediastinal, hilar, or axillary lymph nodes. Thyroid gland, trachea, and esophagus demonstrate no significant findings.   Lungs/Pleura: Pneumothorax or pleural effusion is noted. No consolidative process is noted. 3 mm nodule is noted anteriorly in right middle lobe best seen on image number 79 of series 6. Also noted is 3 mm nodule seen in left lower lobe best seen on image number 109 of series 6, which was present on prior exam of 2016.   Upper Abdomen: No acute abnormality.   Musculoskeletal: No chest wall abnormality. No acute or significant osseous findings.   Review of the MIP images confirms the above findings.   IMPRESSION: No definite evidence of pulmonary embolus.   Stable 3 mm nodule seen in left lower lobe which can be considered benign at this point. 3 mm nodule is noted in right middle lobe which was not included in field of view of prior exam. No follow-up needed if patient is low-risk.  Non-contrast chest CT can be considered in 12 months if patient is high-risk. This recommendation follows the consensus statement: Guidelines for Management of Incidental Pulmonary Nodules Detected on CT Images: From the Fleischner Society 2017; Radiology 2017; 284:228-243.  EKG:  EKG is personally reviewed.   01/29/2022: NSR at 84 bpm  Recent Labs: 01/12/2022: BUN 13; Creatinine, Ser 0.76; Hemoglobin 12.8; Platelets 273; Potassium 3.8; Sodium 137   Recent Lipid Panel No results found for: CHOL, TRIG, HDL, CHOLHDL, VLDL, LDLCALC, LDLDIRECT  Physical Exam:    VS:  BP 110/60 (BP Location: Left Arm, Patient Position: Sitting,  Cuff Size: Normal)    Pulse 84    Ht 5\' 8"  (1.727 m)    Wt 140 lb 9.6 oz (63.8 kg)    LMP 12/21/2014 (Approximate)    BMI 21.38 kg/m     Wt Readings from Last 3 Encounters:  01/29/22 140 lb 9.6 oz (63.8 kg)  01/12/22 136 lb (61.7 kg)  04/05/18 167 lb (75.8 kg)    GEN: Well nourished, well developed in no acute distress HEENT: Normal, moist mucous membranes NECK: No JVD CARDIAC: regular rhythm, normal S1 and S2, no rubs or gallops. No murmur. VASCULAR: Radial and DP pulses 2+ bilaterally. No carotid bruits RESPIRATORY:  Clear to auscultation without rales, wheezing or rhonchi  ABDOMEN: Soft, non-tender, non-distended MUSCULOSKELETAL:  Ambulates independently SKIN: Warm and dry, no edema NEUROLOGIC:  Alert and oriented x 3. No focal neuro deficits noted. PSYCHIATRIC:  Normal affect    ASSESSMENT:    1. Palpitation   2. Cardiac risk counseling   3. Hospital discharge follow-up   4. Atypical chest pain    PLAN:    Palpitations Post ER-follow up Atypical chest pain -her constellation of symptoms (night sweats, unintended weight loss, palpitations) is concerning for an underlying issue, but workup thus far has ben unremarkable -palpitations improved with propranolol -unclear whether she had arrhythmia with symptoms while on telemetry, as I cannot see telemetry strips -ECGs unremarkable -we discussed options today. Will proceed with Zio monitor. Would recommend she come off of her propranolol to evaluate fully, though OK to take if she is having severe symptoms -if monitor significantly abnormal, would get echo -CT PE without cardiac abnormalities, hsTn unremarkable -if there is significant ectopy/arrhythmia, would consider metoprolol succinate to change to once daily dosing  Cardiac risk counseling: low risk -ASCVD risk score: The ASCVD Risk score (Arnett DK, et al., 2019) failed to calculate for the following reasons:   The 2019 ASCVD risk score is only valid for ages 71 to 39     Plan for follow up: 6 weeks or sooner as needed.  Buford Dresser, MD, PhD, [facility]    Medication Adjustments/Labs and Tests Ordered: Current medicines are reviewed at length with the patient today.  Concerns regarding medicines are outlined above.   Orders Placed This Encounter  Procedures   LONG TERM MONITOR (3-14 DAYS)   EKG 12-Lead   No orders of the defined types were placed in this encounter.  Patient Instructions  Medication Instructions:  Your Physician recommend you continue on your current medication as directed.    *If you need a refill on your cardiac medications before your next appointment, please call your pharmacy*   Lab Work: None ordered today   Testing/Procedures: Your physician has recommended that you wear a Zio monitor.   This monitor is a medical device that records the hearts electrical activity. Doctors most often use these  monitors to diagnose arrhythmias. Arrhythmias are problems with the speed or rhythm of the heartbeat. The monitor is a small device applied to your chest. You can wear one while you do your normal daily activities. While wearing this monitor if you have any symptoms to push the button and record what you felt. Once you have worn this monitor for the period of time provider prescribed (Usually 14 days), you will return the monitor device in the postage paid box. Once it is returned they will download the data collected and provide [place] with a report which the provider will then review and we will call you with those results. Important tips:  Avoid showering during the first 24 hours of wearing the monitor. Avoid excessive sweating to help maximize wear time. Do not submerge the device, no hot tubs, and no swimming pools. Keep any lotions or oils away from the patch. After 24 hours you may shower with the patch on. Take brief showers with your back facing the shower head.  Do not remove patch once it has  been placed because that will interrupt data and decrease adhesive wear time. Push the button when you have any symptoms and write down what you were feeling. Once you have completed wearing your monitor, remove and place into box which has postage paid and place in your outgoing mailbox.  If for some reason you have misplaced your box then call our office and we can provide another box and/or mail it off for you.      Follow-Up: At [facility], you and your health needs are our priority.  As part of our continuing mission to provide you with exceptional heart care, we have created designated Provider Care Teams.  These Care Teams include your primary Cardiologist (physician) and Advanced Practice Providers (APPs -  Physician Assistants and Nurse Practitioners) who all work together to provide you with the care you need, when you need it.  We recommend signing up for the patient portal called "MyChart".  Sign up information is provided on this After Visit Summary.  MyChart is used to connect with patients for Virtual Visits (Telemedicine).  Patients are able to view lab/test results, encounter notes, upcoming appointments, etc.  Non-urgent messages can be sent to your provider as well.   To learn more about what you can do with MyChart, go to [email or web address].    Your next appointment:   6 week(s)  The format for your next appointment:   In Person  Provider:   Buford Dresser, MD      [facility],acting as a scribe for Buford Dresser, MD.,have documented all relevant documentation on the behalf of Buford Dresser, MD,as directed by  Buford Dresser, MD while in the presence of Buford Dresser, MD.  I, Buford Dresser, MD, have reviewed all documentation for this visit. The documentation on 01/29/22 for the exam, diagnosis, procedures, and orders are all accurate and complete.   Signed, Buford Dresser, MD PhD 01/29/2022 12:29 PM     [place]

## 2022-03-08 NOTE — Progress Notes (Signed)
?Cardiology Office Note:   ? ?Date:  03/12/2022  ? ?ID:  Brenda Webb, DOB May 25, 1986, MRN [id number] ? ?PCP:  Chesley Noon, MD  ?Cardiologist:  Buford Dresser, MD ? ?Referring MD: Chesley Noon, MD  ? ?CC: Follow-up for palpitations ? ?History of Present Illness:   ? ?[name] is a 36 y.o. female with a hx of psoriatic arthritis, nephrolithiasis, and anxiety who is seen for follow-up. I initially met her 01/29/2022 as a new consult at the request of Chesley Noon, MD for the evaluation and management of palpitations. ? ?CV history: ?ED 01/11/2022: intermittent left-sided chest pain radiating around to her back and radiating distally to her LLE. She had palpitations lasting 30 seconds every 2 minutes. Troponins and thyroid work-up showed no significant findings. She was referred for outpatient follow-up with cardiology. ?ED 01/12/22: similar concerns, and was concerned she had a PE. CTA chest was unremarkable aside from mild pulmonary nodules most likely benign at her age. She was referred for follow-up with her PCP and with cardiology. ? ?Family history: Her father died of a blood clot, and her mother died from complications due to [id number]. ? ?Today: ?We reviewed her monitor results in detail. She had 99 patient triggered events.  ? ?Overall, she is feeling unchanged since her last visit. She feels like her heart beat is more regular on propranolol, but she does not believe she needs to take it 3 times a day. Lately she has been taking 1 tablet of propranolol, including while wearing the monitor.  ? ?She denies any chest pain, shortness of breath, or peripheral edema. No lightheadedness, headaches, syncope, orthopnea, or PND. ? ? ?Past Medical History:  ?Diagnosis Date  ? Anxiety   ? Arthritis   ? Complication of anesthesia   ? "wakes up during anesthesia"  ? Depression   ? History of kidney stones   ? no surgery required - passed stone  ? Psoriatic arthritis ([place])   ? SVD (spontaneous vaginal  delivery)   ? x 2  ? ? ?Past Surgical History:  ?Procedure Laterality Date  ? CHOLECYSTECTOMY  11/12/2011  ? Procedure: LAPAROSCOPIC CHOLECYSTECTOMY;  Surgeon: Gayland Curry, MD;  Location: [facility];  Service: General;  Laterality: N/A;  ? HYSTEROSCOPY WITH NOVASURE N/A 03/20/2016  ? Procedure: Dilatation and Currettage, HYSTEROSCOPY WITH NOVASURE;  Surgeon: Olga Millers, MD;  Location: [facility];  Service: Gynecology;  Laterality: N/A;  ? TONSILLECTOMY  2004  ? TUBAL LIGATION Bilateral 09/10/2015  ? Procedure: POST PARTUM TUBAL LIGATION;  Surgeon: Bobbye Charleston, MD;  Location: [facility];  Service: Gynecology;  Laterality: Bilateral;  ? VAGINAL HYSTERECTOMY N/A 07/18/2017  ? Procedure: HYSTERECTOMY VAGINAL;  Surgeon: Olga Millers, MD;  Location: [facility];  Service: Gynecology;  Laterality: N/A;  ? WISDOM TOOTH EXTRACTION    ? ? ?Current Medications: ?Current Outpatient Medications on File Prior to Visit  ?Medication Sig  ? cyclobenzaprine (FLEXERIL) 10 MG tablet Take 10 mg by mouth daily as needed for muscle spasms.  ? DULoxetine (CYMBALTA) 60 MG capsule Take 60 mg by mouth daily.  ? EPINEPHrine 0.3 mg/0.3 mL IJ SOAJ injection epinephrine 0.3 mg/0.3 mL injection, auto-injector  ? FLUoxetine (PROZAC) 20 MG capsule Take 20 mg by mouth daily.  ? folic acid (FOLVITE) 1 MG tablet Take 1 tablet by mouth daily at 12 noon.  ? HYDROcodone-Acetaminophen 7.5-300 MG TABS Take by mouth.  ? LINZESS 290 MCG CAPS capsule Take 290 mcg by mouth as needed.  ?  LORazepam (ATIVAN) 0.5 MG tablet Take 1 tablet by mouth as needed.  ? ?No current facility-administered medications on file prior to visit.  ?  ? ?Allergies:   Shrimp [shellfish allergy]  ? ?Social History  ? ?Tobacco Use  ? Smoking status: Former  ?  Packs/day: 0.25  ?  Years: 5.00  ?  Pack years: 1.25  ?  Types: Cigarettes  ? Smokeless tobacco: Never  ? Tobacco comments:  ?  quit approx 10 yrs ago  ?Vaping Use  ? Vaping Use: Never used  ?Substance Use Topics  ? Alcohol use: No  ?  Drug use: No  ? ? ?Family History: ?family history includes Cancer in her paternal grandfather. ? ?ROS:   ?Please see the history of present illness. ?(+) Palpitations ?All other systems are reviewed and negative.  ? ? ?EKGs/Labs/Other Studies Reviewed:   ? ?The following studies were reviewed today: ? ?Monitor 02/2022: ?Patch Wear Time:  13 days and 21 hours ?  ?Patient had a min HR of 44 bpm, max HR of 164 bpm, and avg HR of 82 bpm. Predominant underlying rhythm was Sinus Rhythm. 1 run of (read as VT, likely AIVR) occurred lasting 7 beats with a max rate of 106 bpm (avg 101 bpm). 2 Supraventricular Tachycardia runs occurred, the run with the fastest interval lasting 10 beats with a max rate of 160 bpm (avg 135 bpm); the run with the fastest interval was also the longest. No atrial fibrillation, high degree block, or pauses noted. Isolated atrial and ventricular ectopy was rare (<1%). There were 99 patient triggered events, which were sinus with/without ectopy. No significant arrhythmias detected. ? ?CTA Chest 01/12/2022: ?COMPARISON:  June 23, 2015. ?  ?FINDINGS: ?Cardiovascular: Satisfactory opacification of the pulmonary arteries ?to the segmental level. No evidence of pulmonary embolism. Normal ?heart size. No pericardial effusion. ?  ?Mediastinum/Nodes: No enlarged mediastinal, hilar, or axillary lymph ?nodes. Thyroid gland, trachea, and esophagus demonstrate no ?significant findings. ?  ?Lungs/Pleura: Pneumothorax or pleural effusion is noted. No ?consolidative process is noted. 3 mm nodule is noted anteriorly in ?right middle lobe best seen on image number 79 of series 6. Also ?noted is 3 mm nodule seen in left lower lobe best seen on image ?number 109 of series 6, which was present on prior exam of 2016. ?  ?Upper Abdomen: No acute abnormality. ?  ?Musculoskeletal: No chest wall abnormality. No acute or significant ?osseous findings. ?  ?Review of the MIP images confirms the above findings. ?  ?IMPRESSION: ?No  definite evidence of pulmonary embolus. ?  ?Stable 3 mm nodule seen in left lower lobe which can be considered ?benign at this point. 3 mm nodule is noted in right middle lobe ?which was not included in field of view of prior exam. No follow-up ?needed if patient is low-risk. Non-contrast chest CT can be ?considered in 12 months if patient is high-risk. This recommendation ?follows the consensus statement: Guidelines for Management of ?Incidental Pulmonary Nodules Detected on CT Images: From the ?Fleischner Society 2017; Radiology 2017; [phone]. ? ?EKG:  EKG is personally reviewed.   ?03/12/2022: EKG was not ordered. ?01/29/2022: NSR at 84 bpm ? ?Recent Labs: ?01/12/2022: BUN 13; Creatinine, Ser 0.76; Hemoglobin 12.8; Platelets 273; Potassium 3.8; Sodium 137  ? ?Recent Lipid Panel ?No results found for: CHOL, TRIG, HDL, CHOLHDL, VLDL, LDLCALC, LDLDIRECT ? ?Physical Exam:   ? ?VS:  BP 120/64 (BP Location: Right Arm, Patient Position: Sitting, Cuff Size: Normal)   Pulse 70  Ht 5' 8"  (1.727 m)   Wt 139 lb 9.6 oz (63.3 kg)   LMP 12/21/2014 (Approximate)   SpO2 97%   BMI 21.23 kg/m?    ? ?Wt Readings from Last 3 Encounters:  ?03/12/22 139 lb 9.6 oz (63.3 kg)  ?01/29/22 140 lb 9.6 oz (63.8 kg)  ?01/12/22 136 lb (61.7 kg)  ?  ?GEN: Well nourished, well developed in no acute distress ?HEENT: Normal, moist mucous membranes ?NECK: No JVD ?CARDIAC: regular rhythm, normal S1 and S2, no rubs or gallops. No murmur. ?VASCULAR: Radial and DP pulses 2+ bilaterally. No carotid bruits ?RESPIRATORY:  Clear to auscultation without rales, wheezing or rhonchi  ?ABDOMEN: Soft, non-tender, non-distended ?MUSCULOSKELETAL:  Ambulates independently ?SKIN: Warm and dry, no edema ?NEUROLOGIC:  Alert and oriented x 3. No focal neuro deficits noted. ?PSYCHIATRIC:  Normal affect   ? ?ASSESSMENT:   ? ?1. Palpitation   ?2. Encounter to discuss test results   ?3. Cardiac risk counseling   ?4. Exercise counseling   ? ? ?PLAN:    ? ?Palpitations ?-we reviewed her monitor at length. One brief AIVR, two brief SVT, not associated with symptoms ?-the majority of her triggered events were sinus, with occasional ectopy ?-no high risk findings ?-she feels bet

## 2022-03-12 ENCOUNTER — Ambulatory Visit (HOSPITAL_BASED_OUTPATIENT_CLINIC_OR_DEPARTMENT_OTHER): Payer: 59 | Admitting: Cardiology

## 2022-03-12 ENCOUNTER — Other Ambulatory Visit: Payer: Self-pay

## 2022-03-12 ENCOUNTER — Encounter (HOSPITAL_BASED_OUTPATIENT_CLINIC_OR_DEPARTMENT_OTHER): Payer: Self-pay | Admitting: Cardiology

## 2022-03-12 VITALS — BP 120/64 | HR 70 | Ht 68.0 in | Wt 139.6 lb

## 2022-03-12 DIAGNOSIS — R002 Palpitations: Secondary | ICD-10-CM

## 2022-03-12 DIAGNOSIS — Z712 Person consulting for explanation of examination or test findings: Secondary | ICD-10-CM | POA: Diagnosis not present

## 2022-03-12 DIAGNOSIS — Z7182 Exercise counseling: Secondary | ICD-10-CM

## 2022-03-12 DIAGNOSIS — Z7189 Other specified counseling: Secondary | ICD-10-CM | POA: Diagnosis not present

## 2022-03-12 MED ORDER — DILTIAZEM HCL 30 MG PO TABS: 30.0000 mg | ORAL_TABLET | Freq: Four times a day (QID) | ORAL | 11 refills | Status: DC | PRN

## 2022-03-12 NOTE — Patient Instructions (Signed)
Medication Instructions:  ?1) STOP: Propranolol 10 mg three times a day ?2) START: Diltiazem 30 mg four times a day as needed ? ?*If you need a refill on your cardiac medications before your next appointment, please call your pharmacy* ? ? ?Lab Work: ?None ordered today ? ? ?Testing/Procedures: ?None ordered today ? ? ?Follow-Up: ?At [facility], you and your health needs are our priority.  As part of our continuing mission to provide you with exceptional heart care, we have created designated Provider Care Teams.  These Care Teams include your primary Cardiologist (physician) and Advanced Practice Providers (APPs -  Physician Assistants and Nurse Practitioners) who all work together to provide you with the care you need, when you need it. ? ?We recommend signing up for the patient portal called "MyChart".  Sign up information is provided on this After Visit Summary.  MyChart is used to connect with patients for Virtual Visits (Telemedicine).  Patients are able to view lab/test results, encounter notes, upcoming appointments, etc.  Non-urgent messages can be sent to your provider as well.   ?To learn more about what you can do with MyChart, go to [email or web address].   ? ?Your next appointment:   ?1 year(s) ? ?The format for your next appointment:   ?In Person ? ?Provider:   ?Jodelle Red, MD{ ? ? ?

## 2022-07-21 ENCOUNTER — Other Ambulatory Visit: Payer: Self-pay

## 2022-07-21 ENCOUNTER — Encounter (HOSPITAL_BASED_OUTPATIENT_CLINIC_OR_DEPARTMENT_OTHER): Payer: Self-pay | Admitting: Emergency Medicine

## 2022-07-21 ENCOUNTER — Emergency Department (HOSPITAL_BASED_OUTPATIENT_CLINIC_OR_DEPARTMENT_OTHER)
Admission: EM | Admit: 2022-07-21 | Discharge: 2022-07-21 | Disposition: A | Discharge status: Home / Self Care | Payer: 59 | Attending: Emergency Medicine | Admitting: Emergency Medicine

## 2022-07-21 ENCOUNTER — Emergency Department (HOSPITAL_BASED_OUTPATIENT_CLINIC_OR_DEPARTMENT_OTHER): Payer: 59

## 2022-07-21 DIAGNOSIS — R112 Nausea with vomiting, unspecified: Secondary | ICD-10-CM | POA: Diagnosis not present

## 2022-07-21 DIAGNOSIS — R109 Unspecified abdominal pain: Secondary | ICD-10-CM | POA: Diagnosis present

## 2022-07-21 DIAGNOSIS — N9489 Other specified conditions associated with female genital organs and menstrual cycle: Secondary | ICD-10-CM | POA: Diagnosis not present

## 2022-07-21 HISTORY — DX: Unspecified ovarian cyst, unspecified side: N83.209

## 2022-07-21 HISTORY — DX: Gestational (pregnancy-induced) hypertension without significant proteinuria, unspecified trimester: O13.9

## 2022-07-21 HISTORY — DX: Unspecified intestinal obstruction, unspecified as to partial versus complete obstruction: K56.609

## 2022-07-21 HISTORY — DX: Tubulo-interstitial nephritis, not specified as acute or chronic: N12

## 2022-07-21 HISTORY — DX: Other postprocedural complications and disorders of digestive system: K56.7

## 2022-07-21 LAB — COMPREHENSIVE METABOLIC PANEL
ALT: 13 U/L (ref 0–44)
AST: 20 U/L (ref 15–41)
Albumin: 4.7 g/dL (ref 3.5–5.0)
Alkaline Phosphatase: 42 U/L (ref 38–126)
Anion gap: 8 (ref 5–15)
BUN: 12 mg/dL (ref 6–20)
CO2: 27 mmol/L (ref 22–32)
Calcium: 9.3 mg/dL (ref 8.9–10.3)
Chloride: 102 mmol/L (ref 98–111)
Creatinine, Ser: 0.87 mg/dL (ref 0.44–1.00)
GFR, Estimated: >60 mL/min (ref >60)
Glucose, Bld: 96 mg/dL (ref 70–99)
Potassium: 4.1 mmol/L (ref 3.5–5.1)
Sodium: 137 mmol/L (ref 135–145)
Total Bilirubin: 0.4 mg/dL (ref 0.3–1.2)
Total Protein: 7.2 g/dL (ref 6.5–8.1)

## 2022-07-21 LAB — LIPASE, BLOOD: Lipase: <10 U/L — ABNORMAL LOW (ref 11–51)

## 2022-07-21 LAB — URINALYSIS, ROUTINE W REFLEX MICROSCOPIC
Bilirubin Urine: NEGATIVE
Glucose, UA: NEGATIVE mg/dL
Hgb urine dipstick: NEGATIVE
Ketones, ur: NEGATIVE mg/dL
Leukocytes,Ua: NEGATIVE
Nitrite: NEGATIVE
Protein, ur: NEGATIVE mg/dL
Specific Gravity, Urine: 1.01 (ref 1.005–1.030)
pH: 7 (ref 5.0–8.0)

## 2022-07-21 LAB — CBC
HCT: 35.6 % — ABNORMAL LOW (ref 36.0–46.0)
Hemoglobin: 11.6 g/dL — ABNORMAL LOW (ref 12.0–15.0)
MCH: 30.2 pg (ref 26.0–34.0)
MCHC: 32.6 g/dL (ref 30.0–36.0)
MCV: 92.7 fL (ref 80.0–100.0)
Platelets: 235 10*3/uL (ref 150–400)
RBC: 3.84 MIL/uL — ABNORMAL LOW (ref 3.87–5.11)
RDW: 12.9 % (ref 11.5–15.5)
WBC: 7 10*3/uL (ref 4.0–10.5)
nRBC: 0 % (ref 0.0–0.2)

## 2022-07-21 LAB — HCG, QUANTITATIVE, PREGNANCY: hCG, Beta Chain, Quant, S: 1 m[IU]/mL (ref <5)

## 2022-07-21 MED ORDER — ONDANSETRON HCL 4 MG/2ML IJ SOLN
4.0000 mg | Freq: Once | INTRAMUSCULAR | Status: AC
  Administered 2022-07-21: 4 mg via INTRAVENOUS
  Filled 2022-07-21: qty 2

## 2022-07-21 MED ORDER — IOHEXOL 300 MG/ML  SOLN
80.0000 mL | Freq: Once | INTRAMUSCULAR | Status: AC | PRN
  Administered 2022-07-21: 100 mL via INTRAVENOUS

## 2022-07-21 MED ORDER — MORPHINE SULFATE (PF) 4 MG/ML IV SOLN
4.0000 mg | Freq: Once | INTRAVENOUS | Status: AC
  Administered 2022-07-21: 4 mg via INTRAVENOUS
  Filled 2022-07-21: qty 1

## 2022-07-21 MED ORDER — ONDANSETRON 8 MG PO TBDP: ORAL_TABLET | ORAL | 0 refills | Status: AC

## 2022-07-21 MED ORDER — HYDROCODONE-ACETAMINOPHEN 5-325 MG PO TABS: 1.0000 | ORAL_TABLET | Freq: Four times a day (QID) | ORAL | 0 refills | Status: AC | PRN

## 2022-07-21 MED ORDER — SODIUM CHLORIDE 0.9 % IV BOLUS
1000.0000 mL | Freq: Once | INTRAVENOUS | Status: AC
  Administered 2022-07-21: 1000 mL via INTRAVENOUS

## 2022-07-21 NOTE — ED Provider Notes (Signed)
[facility] EMERGENCY DEPT Provider Note   CSN: [id number] Arrival date & time: 07/21/22  0445     History  No chief complaint on file.   Janeil Schexnayder is a 36 y.o. female.  Patient is a 36 year old female with past medical history of psoriatic arthritis, prior hysterectomy, cholecystectomy, and exploratory laparotomy for intestinal volvulus.  Patient presenting today with complaints of abdominal pain and vomiting.  This started approximately 24 hours ago.  She describes pain and bloating to the right side of her abdomen that has worsened just that she has going on.  She describes multiple episode of vomiting which was not on bloody.  She denies to me she is having any diarrhea, bloody stools, fevers, or chills.  She denies any ill contacts or having consumed any undercooked or suspicious foods.  The history is provided by the patient.       Home Medications Prior to Admission medications   Medication Sig Start Date End Date Taking? Authorizing Provider  cyclobenzaprine (FLEXERIL) 10 MG tablet Take 10 mg by mouth daily as needed for muscle spasms.    [provider]  diltiazem (CARDIZEM) 30 MG tablet Take 1 tablet (30 mg total) by mouth 4 (four) times daily as needed (palpitations). 03/12/22   Jodelle Red, MD  DULoxetine (CYMBALTA) 60 MG capsule Take 60 mg by mouth daily. 01/27/22   [provider]  EPINEPHrine 0.3 mg/0.3 mL IJ SOAJ injection epinephrine 0.3 mg/0.3 mL injection, auto-injector 03/19/19   [provider]  FLUoxetine (PROZAC) 20 MG capsule Take 20 mg by mouth daily. 01/27/22   [provider]  folic acid (FOLVITE) 1 MG tablet Take 1 tablet by mouth daily at 12 noon. 06/12/16   [provider]  HYDROcodone-Acetaminophen 7.5-300 MG TABS Take by mouth.    [provider]  LINZESS 290 MCG CAPS capsule Take 290 mcg by mouth as needed. 12/11/21   [provider]  LORazepam (ATIVAN) 0.5 MG tablet  Take 1 tablet by mouth as needed. 09/15/21   [provider]      Allergies    Shrimp [shellfish allergy]    Review of Systems   Review of Systems  All other systems reviewed and are negative.   Physical Exam Updated Vital Signs BP 133/74   Pulse 83   Temp 98 F (36.7 C) (Oral)   Resp 16   Ht 5\' 8"  (1.727 m)   Wt 59 kg   LMP 12/21/2014 (Approximate)   SpO2 98%   BMI 19.77 kg/m  Physical Exam Vitals and nursing note reviewed.  Constitutional:      General: She is not in acute distress.    Appearance: She is well-developed. She is not diaphoretic.  HENT:     Head: Normocephalic and atraumatic.  Cardiovascular:     Rate and Rhythm: Normal rate and regular rhythm.     Heart sounds: No murmur heard.    No friction rub. No gallop.  Pulmonary:     Effort: Pulmonary effort is normal. No respiratory distress.     Breath sounds: Normal breath sounds. No wheezing.  Abdominal:     General: Bowel sounds are normal. There is no distension.     Palpations: Abdomen is soft.     Tenderness: There is abdominal tenderness. There is right CVA tenderness. There is no left CVA tenderness, guarding or rebound.     Hernia: No hernia is present.     Comments: There is tenderness to palpation in the  right mid abdomen.  There is no palpable abnormality.  Musculoskeletal:        General: Normal range of motion.     Cervical back: Normal range of motion and neck supple.  Skin:    General: Skin is warm and dry.  Neurological:     General: No focal deficit present.     Mental Status: She is alert and oriented to person, place, and time.     ED Results / Procedures / Treatments   Labs (all labs ordered are listed, but only abnormal results are displayed) Labs Reviewed  LIPASE, BLOOD  COMPREHENSIVE METABOLIC PANEL  CBC  URINALYSIS, ROUTINE W REFLEX MICROSCOPIC  PREGNANCY, URINE  HCG, SERUM, QUALITATIVE    EKG None  Radiology No results found.  Procedures Procedures     Medications Ordered in ED Medications  sodium chloride 0.9 % bolus 1,000 mL (has no administration in time range)  ondansetron (ZOFRAN) injection 4 mg (has no administration in time range)  morphine (PF) 4 MG/ML injection 4 mg (has no administration in time range)    ED Course/ Medical Decision Making/ A&P  This patient presents to the ED for concern of abdominal pain, nausea, and vomiting, this involves an extensive number of treatment options, and is a complaint that carries with it a high risk of complications and morbidity.  The differential diagnosis includes recurrent volvulus, small bowel obstruction, acute appendicitis, acute diverticulitis, acute pancreatitis   Co morbidities that complicate the patient evaluation  Prior intestinal volvulus requiring surgery   Additional history obtained:  External records from outside source obtained and reviewed including surgical records at [facility]   Lab Tests:  I Ordered, and personally interpreted labs.  The pertinent results include: Unremarkable CBC, metabolic panel, and lipase.   Imaging Studies ordered:  I ordered imaging studies including CT scan of the abdomen and pelvis I independently visualized and interpreted imaging which showed no acute intra-abdominal process I agree with the radiologist interpretation   Cardiac Monitoring: / EKG:  None performed   Consultations Obtained:  No consultations needed   Problem List / ED Course / Critical interventions / Medication management  Patient presenting here with abdominal pain, nausea, and vomiting.  I suspect either a foodborne illness or viral etiology as her work-up reveals no obstruction or surgical issue.  She was given medicine for pain and nausea along with IV fluids and is feeling somewhat better.  She is now tolerating p.o.  Patient seems appropriate for discharge with as needed return. I ordered medication including morphine and Zofran for pain and  nausea Reevaluation of the patient after these medicines showed that the patient improved I have reviewed the patients home medicines and have made adjustments as needed   Social Determinants of Health:  None   Test / Admission - Considered:  Patient to be discharged with clear liquid diet, Zofran, and a small quantity of hydrocodone.  She is to return as needed if symptoms worsen or change.  No emergent pathology identified that would require admission.  Final Clinical Impression(s) / ED Diagnoses Final diagnoses:  None    Rx / DC Orders ED Discharge Orders     None         Geoffery Lyons, MD 07/21/22 [phone]

## 2022-07-21 NOTE — Discharge Instructions (Addendum)
Begin taking hydrocodone as prescribed as needed for pain.  Begin taking Zofran as prescribed as needed for nausea.  Clear liquid diet for the next 12 hours, then slowly advance to normal as tolerated.  Return to the emergency department if you develop worsening pain, high fevers, bloody stool or vomit, or for other new and concerning symptoms.

## 2022-07-21 NOTE — ED Notes (Addendum)
Pt now back in room after ambulating independently to and from hall bathroom - upon return from bathroom pt reports pain is increasing again; pt suspects moving around triggered pain recurrence - Dr. Judd Lien to be notified via secure chat

## 2022-07-21 NOTE — ED Notes (Signed)
Pt now returned from CT dept via w/c - no acute changes.  Reports nausea has significantly improved since Zofran administration - pain level 3/10.  Now oob to hall bathroom with urine cup

## 2022-07-21 NOTE — ED Triage Notes (Signed)
Starting this morning has been experiencing cramping and indigestion. Later in the day became more nauseated, bloated and started vomiting this afternoon 9x6 episodes of vomiting); RUQ abd pain worsened as well . Tried zofran and dramamine at home without relief.  H/o perforated colon last year that required emergency surgery. States that this pain feels similar to this.  Denies constipation, fever, blood in stool or vomit, urinary sx.

## 2022-07-21 NOTE — ED Notes (Signed)
Patient transported to CT via w/c. 

## 2023-05-06 ENCOUNTER — Other Ambulatory Visit (HOSPITAL_BASED_OUTPATIENT_CLINIC_OR_DEPARTMENT_OTHER): Payer: Self-pay | Admitting: Cardiology

## 2023-05-06 DIAGNOSIS — R002 Palpitations: Secondary | ICD-10-CM

## 2023-05-06 NOTE — Telephone Encounter (Signed)
Left message for patient to call and schedule over due follow up with Dr. Christopher / APP for medication refills 

## 2023-05-09 NOTE — Telephone Encounter (Signed)
Left message for patient to call and schedule overdue follow up with Dr. Christopher for medication refills 

## 2023-05-15 ENCOUNTER — Encounter (HOSPITAL_BASED_OUTPATIENT_CLINIC_OR_DEPARTMENT_OTHER): Payer: Self-pay | Admitting: Cardiology

## 2023-05-15 NOTE — Telephone Encounter (Signed)
Left message for patient to call and schedule over due follow up with Dr. Cristal Deer.  Will also mail letter requesting patient to call

## 2023-11-02 ENCOUNTER — Encounter (HOSPITAL_BASED_OUTPATIENT_CLINIC_OR_DEPARTMENT_OTHER): Payer: Self-pay

## 2023-11-02 ENCOUNTER — Emergency Department (HOSPITAL_BASED_OUTPATIENT_CLINIC_OR_DEPARTMENT_OTHER)
Admission: EM | Admit: 2023-11-02 | Discharge: 2023-11-02 | Disposition: A | Discharge status: Home / Self Care | Payer: 59 | Attending: Emergency Medicine | Admitting: Emergency Medicine

## 2023-11-02 ENCOUNTER — Emergency Department (HOSPITAL_BASED_OUTPATIENT_CLINIC_OR_DEPARTMENT_OTHER): Payer: 59 | Admitting: Radiology

## 2023-11-02 ENCOUNTER — Other Ambulatory Visit: Payer: Self-pay

## 2023-11-02 DIAGNOSIS — R630 Anorexia: Secondary | ICD-10-CM | POA: Insufficient documentation

## 2023-11-02 DIAGNOSIS — Z20822 Contact with and (suspected) exposure to covid-19: Secondary | ICD-10-CM | POA: Diagnosis not present

## 2023-11-02 DIAGNOSIS — R4589 Other symptoms and signs involving emotional state: Secondary | ICD-10-CM

## 2023-11-02 DIAGNOSIS — R531 Weakness: Secondary | ICD-10-CM | POA: Insufficient documentation

## 2023-11-02 DIAGNOSIS — F419 Anxiety disorder, unspecified: Secondary | ICD-10-CM | POA: Insufficient documentation

## 2023-11-02 DIAGNOSIS — M533 Sacrococcygeal disorders, not elsewhere classified: Secondary | ICD-10-CM | POA: Diagnosis not present

## 2023-11-02 DIAGNOSIS — R509 Fever, unspecified: Secondary | ICD-10-CM | POA: Insufficient documentation

## 2023-11-02 LAB — CBC WITH DIFFERENTIAL/PLATELET
Abs Immature Granulocytes: 0.01 10*3/uL (ref 0.00–0.07)
Basophils Absolute: 0 10*3/uL (ref 0.0–0.1)
Basophils Relative: 1 %
Eosinophils Absolute: 0.1 10*3/uL (ref 0.0–0.5)
Eosinophils Relative: 1 %
HCT: 32.9 % — ABNORMAL LOW (ref 36.0–46.0)
Hemoglobin: 10.7 g/dL — ABNORMAL LOW (ref 12.0–15.0)
Immature Granulocytes: 0 %
Lymphocytes Relative: 39 %
Lymphs Abs: 2.2 10*3/uL (ref 0.7–4.0)
MCH: 29.6 pg (ref 26.0–34.0)
MCHC: 32.5 g/dL (ref 30.0–36.0)
MCV: 91.1 fL (ref 80.0–100.0)
Monocytes Absolute: 0.6 10*3/uL (ref 0.1–1.0)
Monocytes Relative: 10 %
Neutro Abs: 2.7 10*3/uL (ref 1.7–7.7)
Neutrophils Relative %: 49 %
Platelets: 234 10*3/uL (ref 150–400)
RBC: 3.61 MIL/uL — ABNORMAL LOW (ref 3.87–5.11)
RDW: 13.7 % (ref 11.5–15.5)
WBC: 5.6 10*3/uL (ref 4.0–10.5)
nRBC: 0 % (ref 0.0–0.2)

## 2023-11-02 LAB — BASIC METABOLIC PANEL
Anion gap: 7 (ref 5–15)
BUN: 10 mg/dL (ref 6–20)
CO2: 24 mmol/L (ref 22–32)
Calcium: 8.6 mg/dL — ABNORMAL LOW (ref 8.9–10.3)
Chloride: 106 mmol/L (ref 98–111)
Creatinine, Ser: 0.71 mg/dL (ref 0.44–1.00)
GFR, Estimated: >60 mL/min (ref >60)
Glucose, Bld: 89 mg/dL (ref 70–99)
Potassium: 3.6 mmol/L (ref 3.5–5.1)
Sodium: 137 mmol/L (ref 135–145)

## 2023-11-02 LAB — RESP PANEL BY RT-PCR (RSV, FLU A&B, COVID)  RVPGX2
Influenza A by PCR: NEGATIVE
Influenza B by PCR: NEGATIVE
Resp Syncytial Virus by PCR: NEGATIVE
SARS Coronavirus 2 by RT PCR: NEGATIVE

## 2023-11-02 LAB — MAGNESIUM: Magnesium: 1.7 mg/dL (ref 1.7–2.4)
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
## 2023-11-02 NOTE — ED Notes (Addendum)
Pt reporting nausea without vomiting or diarrhea. Recent "dislocation of coccyx" 1 week ago and patient reports nausea with the pain and inability to eat due to fear of having to have a bowel movement.

## 2023-11-02 NOTE — Discharge Instructions (Addendum)
Your symptoms of fever could be due to viral infection.  COVID, flu, chest x-ray was clear.  Your laboratory evaluation was reassuring with no electrolyte abnormalities and no evidence of an acute kidney injury.  You overall appear to be well-hydrated.  Continue to hydrate orally at home, follow-up outpatient with your primary care physician your prescribed home opiates for pain control in the setting of your sacral injury

## 2023-11-02 NOTE — ED Triage Notes (Signed)
Pt states that she hasn't been able to eat for 4 days. Pt is trying to increase fluid intake but she feels like she is dehydrated. Pt complains of weakness and fatigue and had fever today at home of 101.

## 2023-11-02 NOTE — ED Notes (Signed)
Reviewed AVS with patient, patient expressed understanding of directions, denies further questions at this time. 

## 2024-06-11 ENCOUNTER — Other Ambulatory Visit: Payer: Self-pay | Admitting: Nurse Practitioner

## 2024-06-11 DIAGNOSIS — M533 Sacrococcygeal disorders, not elsewhere classified: Secondary | ICD-10-CM

## 2024-07-01 ENCOUNTER — Ambulatory Visit
Admission: RE | Admit: 2024-07-01 | Discharge: 2024-07-01 | Disposition: A | Discharge status: Home / Self Care | Source: Ambulatory Visit | Attending: Nurse Practitioner | Admitting: Nurse Practitioner

## 2024-07-01 DIAGNOSIS — M533 Sacrococcygeal disorders, not elsewhere classified: Secondary | ICD-10-CM

## 2024-07-14 ENCOUNTER — Other Ambulatory Visit: Payer: Self-pay | Admitting: Nurse Practitioner

## 2024-07-14 DIAGNOSIS — M5416 Radiculopathy, lumbar region: Secondary | ICD-10-CM

## 2024-07-18 ENCOUNTER — Ambulatory Visit
Admission: RE | Admit: 2024-07-18 | Discharge: 2024-07-18 | Disposition: A | Discharge status: Home / Self Care | Source: Ambulatory Visit | Attending: Nurse Practitioner | Admitting: Nurse Practitioner

## 2024-07-18 DIAGNOSIS — M5416 Radiculopathy, lumbar region: Secondary | ICD-10-CM

## 2024-08-19 ENCOUNTER — Telehealth: Payer: Self-pay | Admitting: Cardiology

## 2024-08-19 NOTE — Telephone Encounter (Signed)
 Left message to call back Was going to see if she wanted to come see Brenda RAMAN NP today at 2:20 pm if still available when patient calls back

## 2024-08-19 NOTE — Telephone Encounter (Signed)
 STAT if HR is under 50 or over 120  (normal HR is 60-100 beats per minute)  What is your heart rate? 100  Do you have a log of your heart rate readings (document readings)? 90-114 since 6:15 this morning  Do you have any other symptoms? No   Palpitations have been going on for four days; pt cut out caffeine  and it did not help

## 2024-08-20 NOTE — Telephone Encounter (Signed)
 Spoke with pt, she is aware of her follow up appointment.

## 2024-08-28 ENCOUNTER — Encounter (HOSPITAL_BASED_OUTPATIENT_CLINIC_OR_DEPARTMENT_OTHER): Payer: Self-pay

## 2024-08-31 ENCOUNTER — Ambulatory Visit (HOSPITAL_BASED_OUTPATIENT_CLINIC_OR_DEPARTMENT_OTHER): Admitting: Family

## 2024-08-31 ENCOUNTER — Encounter (HOSPITAL_BASED_OUTPATIENT_CLINIC_OR_DEPARTMENT_OTHER): Payer: Self-pay | Admitting: Family

## 2024-08-31 ENCOUNTER — Ambulatory Visit: Attending: Family

## 2024-08-31 VITALS — BP 115/60 | HR 87 | Resp 16 | Ht 67.0 in | Wt 141.0 lb

## 2024-08-31 DIAGNOSIS — R400 Somnolence: Secondary | ICD-10-CM | POA: Diagnosis not present

## 2024-08-31 DIAGNOSIS — R002 Palpitations: Secondary | ICD-10-CM

## 2024-08-31 DIAGNOSIS — I498 Other specified cardiac arrhythmias: Secondary | ICD-10-CM

## 2024-08-31 MED ORDER — DILTIAZEM HCL 30 MG PO TABS: 30.0000 mg | ORAL_TABLET | Freq: Two times a day (BID) | ORAL | 1 refills | Status: AC | PRN

## 2024-08-31 MED ORDER — PROPRANOLOL HCL 20 MG PO TABS: 20.0000 mg | ORAL_TABLET | Freq: Two times a day (BID) | ORAL | 1 refills | Status: DC | PRN

## 2024-08-31 NOTE — Progress Notes (Unsigned)
 Enrolled patient for a 14 day Zio XT monitor to be mailed to patients home  Brenda Webb to read

## 2024-08-31 NOTE — Progress Notes (Signed)
 Cardiology Office Note   Date:  08/31/24  ID:  Brenda Webb, DOB 1986/04/30, MRN [id number] PCP: Brenda Camie Pepper, PA-C  [facility] Providers Cardiologist:  Brenda Bruckner, MD     History of Present Illness  Discussed the use of AI scribe software for clinical note transcription with the patient, who gave verbal consent to proceed.  History of Present Illness Brenda Webb is a 38 year old female who presents with recurrent episodes of palpitations. She has hx of psoriatic arthritis, nephrolithiasis, anxiety.   Previously evaluated by Dr. Bruckner with Webb with one brief AIVR, two brief SVT, not associated with symptoms. She was give PRN Diltiazem .   Presents today for follow up. She experiences palpitations characterized by a racing heart, primarily upon waking, with her pulse increasing from 63 to 100-142 beats per minute. These episodes occur independently of caffeine  intake. She has tried lifestyle modifications, including reducing caffeine  and taking magnesium  citrate. She previously discontinued heart medication but now uses diltiazem  during episodes, which only partially reduces her heart rate to 115-120 beats per minute. No specific trigger for the palpitations has been identified, and recent lab work, including thyroid function, magnesium , potassium, and hemoglobin levels, were normal with PCP.  She experiences fatigue and poor sleep, averaging four to six hours per night, and has been told she holds her breath during sleep. There is a family history of sleep apnea, but no snoring is reported. She grinds her teeth and feels exhausted upon waking.  She experiences occasional leg swelling, attributed to a bulging disc at the S1 nerve root. She engages in regular physical activity, including swimming, hiking, and yard work, which she believes helps maintain her resting heart rate in the 60s. There is a family history of embolism, as her father died from one,  contributing to her concern about her symptoms.  ROS: Please see the history of present illness.    All other systems reviewed and are negative.   Studies Reviewed EKG Interpretation Date/Time:  Monday August 31 2024 09:25:07 EDT Ventricular Rate:  90 PR Interval:  114 QRS Duration:  70 QT Interval:  336 QTC Calculation: 411 R Axis:   55  Text Interpretation: Normal sinus rhythm with sinus arrhythmia  No acute ST/T wave changes. Confirmed by Brenda Webb ([id number]) on 09/02/2024 4:58:12 PM    Cardiac Studies & Procedures   ______________________________________________________________________________________________        Brenda  LONG TERM Webb (3-14 DAYS) 02/20/2022  Narrative Patch Wear Time:  13 days and 21 hours  Patient had a min HR of 44 bpm, max HR of 164 bpm, and avg HR of 82 bpm. Predominant underlying rhythm was Sinus Rhythm. 1 run of Ventricular Tachycardia occurred lasting 7 beats with a max rate of 106 bpm (avg 101 bpm). 2 Supraventricular Tachycardia runs occurred, the run with the fastest interval lasting 10 beats with a max rate of 160 bpm (avg 135 bpm); the run with the fastest interval was also the longest. No atrial fibrillation, high degree block, or pauses noted. Isolated atrial and ventricular ectopy was rare (<1%). There were 99 patient triggered events, which were sinus with/without ectopy. No significant arrhythmias detected.       ______________________________________________________________________________________________      Risk Assessment/Calculations      STOP-Bang Score:  3      Physical Exam VS:  BP 115/60 (BP Location: Left Arm, Patient Position: Sitting, Cuff Size: Normal)   Pulse 87   Resp 16   Ht  5' 7 (1.702 m)   Wt 141 lb (64 kg)   LMP 12/21/2014 (Approximate)   SpO2 98%   BMI 22.08 kg/m        Wt Readings from Last 3 Encounters:  08/31/24 141 lb (64 kg)  11/02/23 130 lb (59 kg)  07/21/22 130 lb (59 kg)    GEN:  Well nourished, well developed in no acute distress NECK: No JVD; No carotid bruits CARDIAC: RRR, no murmurs, rubs, gallops RESPIRATORY:  Clear to auscultation without rales, wheezing or rhonchi  ABDOMEN: Soft, non-tender, non-distended EXTREMITIES:  No edema; No deformity   ASSESSMENT AND PLAN Assessment and Plan Assessment & Plan Palpitations Intermittent palpitations with heart rate fluctuations. EKG shows normal sinus rhythm with sinus arrhythmia. Labs normal. Diltiazem  provides limited relief, possibly expired. Potential dehydration upon waking. FDifferential includes dehydration and potential sleep apnea. - Refill diltiazem  30mg  PRN and propranolol  20mg  PRNprescriptions. - Instruct to take diltiazem  or propranolol  as needed, not simultaneously. - Recommend morning electrolytes for potential dehydration. - Order heart Webb to assess arrhythmia patterns.  Somnolence (Excessive Daytime Sleepiness) Exhaustion upon waking despite 4-6 hours of sleep. No snoring, holds breath during concentration. Family history of sleep apnea. Potential link between sleep apnea and arrhythmias. No previous sleep study. - Order home sleep study to evaluate for sleep apnea.         Dispo: follow up in 3 mos  Signed, Brenda GORMAN Finder, NP

## 2024-08-31 NOTE — Patient Instructions (Signed)
 Medication Instructions:   START Diltiazem  30mg  as needed up to twice per day for palpitations  START Propranolol  20mg  as needed up to twice per day for palpitations  *If you need a refill on your cardiac medications before your next appointment, please call your pharmacy*  Testing/Procedures:  Your physician has recommended that you wear a Zio monitor.   This monitor is a medical device that records the heart's electrical activity. Doctors most often use these monitors to diagnose arrhythmias. Arrhythmias are problems with the speed or rhythm of the heartbeat. The monitor is a small device applied to your chest. You can wear one while you do your normal daily activities. While wearing this monitor if you have any symptoms to push the button and record what you felt. Once you have worn this monitor for the period of time provider prescribed (Usually 14 days), you will return the monitor device in the postage paid box. Once it is returned they will download the data collected and provide us  with a report which the provider will then review and we will call you with those results. Important tips:  Avoid showering during the first 24 hours of wearing the monitor. Avoid excessive sweating to help maximize wear time. Do not submerge the device, no hot tubs, and no swimming pools. Keep any lotions or oils away from the patch. After 24 hours you may shower with the patch on. Take brief showers with your back facing the shower head.  Do not remove patch once it has been placed because that will interrupt data and decrease adhesive wear time. Push the button when you have any symptoms and write down what you were feeling. Once you have completed wearing your monitor, remove and place into box which has postage paid and place in your outgoing mailbox.  If for some reason you have misplaced your box then call our office and we can provide another box and/or mail it off for  you.  ------------------------------ WatchPAT?  Is a FDA cleared portable home sleep study test that uses a watch and 3 points of contact to monitor 7 different channels, including your heart rate, oxygen saturations, body position, snoring, and chest motion.  The study is easy to use from the comfort of your own home and accurately detect sleep apnea.  Before bed, you attach the chest sensor, attached the sleep apnea bracelet to your nondominant hand, and attach the finger probe.  After the study, the raw data is downloaded from the watch and scored for apnea events.   For more information: [email or web address].   We have ordered this study and once approved by insurance it will be mailed to your home.    Follow-Up: At [facility], you and your health needs are our priority.  As part of our continuing mission to provide you with exceptional heart care, our providers are all part of one team.  This team includes your primary Cardiologist (physician) and Advanced Practice Providers or APPs (Physician Assistants and Nurse Practitioners) who all work together to provide you with the care you need, when you need it.  Your next appointment:   3 month(s)  Provider:   Shelda Bruckner, MD, Rosaline Bane, NP, or Reche Finder, NP    We recommend signing up for the patient portal called MyChart.  Sign up information is provided on this After Visit Summary.  MyChart is used to connect with patients for Virtual Visits (Telemedicine).  Patients are able to view lab/test results, encounter notes,  upcoming appointments, etc.  Non-urgent messages can be sent to your provider as well.   To learn more about what you can do with MyChart, go to [email or web address].   Other Instructions  To prevent palpitations: Make sure you are adequately hydrated.  Avoid and/or limit caffeine  containing beverages like soda or tea. Exercise regularly.  Manage stress well. Some  over the counter medications can cause palpitations such as Benadryl , AdvilPM, TylenolPM. Regular Advil  or Tylenol  do not cause palpitations.

## 2024-10-02 ENCOUNTER — Other Ambulatory Visit (HOSPITAL_BASED_OUTPATIENT_CLINIC_OR_DEPARTMENT_OTHER): Payer: Self-pay | Admitting: Family

## 2024-10-02 DIAGNOSIS — I498 Other specified cardiac arrhythmias: Secondary | ICD-10-CM

## 2024-10-02 DIAGNOSIS — R002 Palpitations: Secondary | ICD-10-CM

## 2024-11-05 DIAGNOSIS — R002 Palpitations: Secondary | ICD-10-CM | POA: Diagnosis not present

## 2024-11-05 DIAGNOSIS — I498 Other specified cardiac arrhythmias: Secondary | ICD-10-CM | POA: Diagnosis not present

## 2024-11-06 ENCOUNTER — Ambulatory Visit (HOSPITAL_BASED_OUTPATIENT_CLINIC_OR_DEPARTMENT_OTHER): Payer: Self-pay | Admitting: Family

## 2024-11-16 ENCOUNTER — Encounter (HOSPITAL_BASED_OUTPATIENT_CLINIC_OR_DEPARTMENT_OTHER): Payer: Self-pay | Admitting: *Deleted

## 2024-11-18 ENCOUNTER — Encounter: Payer: Self-pay | Admitting: Cardiology

## 2024-11-18 DIAGNOSIS — R0683 Snoring: Secondary | ICD-10-CM | POA: Diagnosis not present

## 2024-11-20 ENCOUNTER — Ambulatory Visit

## 2024-11-20 DIAGNOSIS — R400 Somnolence: Secondary | ICD-10-CM

## 2024-11-20 NOTE — Procedures (Signed)
     SLEEP STUDY REPORT Patient Information Study Date: 11/18/2024 Patient Name: Brenda Webb Patient ID: [id number] Birth Date: 05-24-1986 Age: 38 Gender: Female BMI: 22.1 (W=141 lb, H=5' 7'') Referring Physician: Reche Finder, NP  TEST DESCRIPTION: Home sleep apnea testing was completed using the WatchPat, a Type 1 device, utilizing peripheral arterial tonometry (PAT), chest movement, actigraphy, pulse oximetry, pulse rate, body position and snore. AHI was calculated with apnea and hypopnea using valid sleep time as the denominator. RDI includes apneas, hypopneas, and RERAs. The data acquired and the scoring of sleep and all associated events were performed in accordance with the recommended standards and specifications as outlined in the AASM Manual for the Scoring of Sleep and Associated Events 2.2.0 (2015).  FINDINGS: 1. No evidence of Obstructive Sleep Apnea with AHI 0.3/hr. 2. No Central Sleep Apnea. 3. Oxygen desaturations as low as 93%. 4. Mild snoring was present. O2 sats were < 88% for 0 minutes. 5. Total sleep time was 6 hrs and 48 min. 6. 35.1% of total sleep time was spent in REM sleep. 7. Normal sleep onset latency at 21 min. 8. Shortened REM sleep onset latency at 50 min. 9. Total awakenings were 3.  DIAGNOSIS: Normal study with no significant sleep disordered breathing.  RECOMMENDATIONS: 1. Normal study with no significant sleep disordered breathing. 2. Healthy sleep recommendations include: adequate nightly sleep (normal 7-9 hrs/night), avoidance of caffeine  after noon and alcohol near bedtime, and maintaining a sleep environment that is cool, dark and quiet. 3. Weight loss for overweight patients is recommended. 4. Snoring recommendations include: weight loss where appropriate, side sleeping, and avoidance of alcohol before bed. 5. Operation of motor vehicle or dangerous equipment must be avoided when feeling drowsy, excessively sleepy, or mentally  fatigued. 6. An ENT consultation which may be useful for specific causes of and possible treatment of bothersome snoring . 7. Weight loss may be of benefit in reducing the severity of snoring.    Signature: Wilbert Bihari, MD; [facility]; Diplomat, American Board of Sleep Medicine Electronically Signed: 11/20/2024 9:44:27 PM

## 2024-11-23 ENCOUNTER — Telehealth: Payer: Self-pay | Admitting: *Deleted

## 2024-11-23 ENCOUNTER — Ambulatory Visit (HOSPITAL_BASED_OUTPATIENT_CLINIC_OR_DEPARTMENT_OTHER): Admitting: Family

## 2024-11-23 NOTE — Telephone Encounter (Signed)
 The patient has been notified of the result via her mychart.

## 2024-11-23 NOTE — Telephone Encounter (Signed)
-----   Message from Brenda Webb sent at 11/20/2024  9:45 PM EST ----- Please let patient know that sleep study showed no significant sleep apnea.

## 2025-01-26 ENCOUNTER — Ambulatory Visit (HOSPITAL_BASED_OUTPATIENT_CLINIC_OR_DEPARTMENT_OTHER): Admitting: Family
